# Patient Record
Sex: Male | Born: 1935 | Race: White | Hispanic: No | Marital: Married | Smoking: Former smoker
Health system: Southern US, Community
[De-identification: ages and names within clinical notes are randomized; demographics above are authoritative.]

## PROBLEM LIST (undated history)

## (undated) DIAGNOSIS — Z9189 Other specified personal risk factors, not elsewhere classified: Secondary | ICD-10-CM

## (undated) DIAGNOSIS — E785 Hyperlipidemia, unspecified: Secondary | ICD-10-CM

## (undated) DIAGNOSIS — R351 Nocturia: Secondary | ICD-10-CM

## (undated) DIAGNOSIS — N289 Disorder of kidney and ureter, unspecified: Secondary | ICD-10-CM

## (undated) DIAGNOSIS — I252 Old myocardial infarction: Secondary | ICD-10-CM

## (undated) DIAGNOSIS — E119 Type 2 diabetes mellitus without complications: Secondary | ICD-10-CM

## (undated) DIAGNOSIS — Z7901 Long term (current) use of anticoagulants: Secondary | ICD-10-CM

## (undated) DIAGNOSIS — Z856 Personal history of leukemia: Secondary | ICD-10-CM

## (undated) DIAGNOSIS — I440 Atrioventricular block, first degree: Secondary | ICD-10-CM

## (undated) DIAGNOSIS — G629 Polyneuropathy, unspecified: Secondary | ICD-10-CM

## (undated) DIAGNOSIS — D494 Neoplasm of unspecified behavior of bladder: Secondary | ICD-10-CM

## (undated) DIAGNOSIS — I10 Essential (primary) hypertension: Secondary | ICD-10-CM

## (undated) DIAGNOSIS — I2510 Atherosclerotic heart disease of native coronary artery without angina pectoris: Secondary | ICD-10-CM

## (undated) DIAGNOSIS — Z8546 Personal history of malignant neoplasm of prostate: Secondary | ICD-10-CM

## (undated) DIAGNOSIS — Z951 Presence of aortocoronary bypass graft: Secondary | ICD-10-CM

## (undated) DIAGNOSIS — I6523 Occlusion and stenosis of bilateral carotid arteries: Secondary | ICD-10-CM

## (undated) DIAGNOSIS — H269 Unspecified cataract: Secondary | ICD-10-CM

## (undated) DIAGNOSIS — Z8619 Personal history of other infectious and parasitic diseases: Secondary | ICD-10-CM

## (undated) DIAGNOSIS — Z973 Presence of spectacles and contact lenses: Secondary | ICD-10-CM

## (undated) DIAGNOSIS — H3530 Unspecified macular degeneration: Secondary | ICD-10-CM

## (undated) DIAGNOSIS — I480 Paroxysmal atrial fibrillation: Secondary | ICD-10-CM

## (undated) HISTORY — DX: Essential (primary) hypertension: I10

## (undated) HISTORY — DX: Unspecified macular degeneration: H35.30

## (undated) HISTORY — DX: Polyneuropathy, unspecified: G62.9

## (undated) HISTORY — PX: CARDIAC CATHETERIZATION: SHX172

## (undated) HISTORY — PX: TRANSTHORACIC ECHOCARDIOGRAM: SHX275

## (undated) HISTORY — DX: Hyperlipidemia, unspecified: E78.5

## (undated) HISTORY — DX: Atherosclerotic heart disease of native coronary artery without angina pectoris: I25.10

---

## 1997-10-11 HISTORY — PX: CORONARY ARTERY BYPASS GRAFT: SHX141

## 2006-10-11 HISTORY — PX: CARDIOVERSION: SHX1299

## 2009-08-22 LAB — PROTIME-INR

## 2009-09-19 ENCOUNTER — Encounter: Payer: Self-pay | Admitting: Cardiovascular Disease

## 2009-09-19 LAB — PROTIME-INR

## 2009-11-28 ENCOUNTER — Encounter: Payer: Self-pay | Admitting: Cardiovascular Disease

## 2010-03-13 ENCOUNTER — Encounter: Payer: Self-pay | Admitting: Cardiovascular Disease

## 2010-11-18 LAB — PROTIME-INR

## 2011-05-20 ENCOUNTER — Ambulatory Visit: Payer: Medicare Other | Admitting: *Deleted

## 2011-05-20 DIAGNOSIS — Z7901 Long term (current) use of anticoagulants: Secondary | ICD-10-CM

## 2011-05-20 DIAGNOSIS — I4891 Unspecified atrial fibrillation: Secondary | ICD-10-CM | POA: Insufficient documentation

## 2011-05-20 DIAGNOSIS — IMO0002 Reserved for concepts with insufficient information to code with codable children: Secondary | ICD-10-CM

## 2011-05-20 LAB — POCT INR: INR: 1.8

## 2011-05-25 ENCOUNTER — Ambulatory Visit (INDEPENDENT_AMBULATORY_CARE_PROVIDER_SITE_OTHER): Payer: Medicare Other | Admitting: *Deleted

## 2011-05-25 DIAGNOSIS — I4891 Unspecified atrial fibrillation: Secondary | ICD-10-CM

## 2011-05-25 DIAGNOSIS — Z7901 Long term (current) use of anticoagulants: Secondary | ICD-10-CM

## 2011-05-25 DIAGNOSIS — IMO0002 Reserved for concepts with insufficient information to code with codable children: Secondary | ICD-10-CM

## 2011-05-25 LAB — POCT INR: INR: 2.1

## 2011-06-23 ENCOUNTER — Ambulatory Visit (INDEPENDENT_AMBULATORY_CARE_PROVIDER_SITE_OTHER): Payer: Medicare Other | Admitting: *Deleted

## 2011-06-23 DIAGNOSIS — Z7901 Long term (current) use of anticoagulants: Secondary | ICD-10-CM

## 2011-06-23 DIAGNOSIS — IMO0002 Reserved for concepts with insufficient information to code with codable children: Secondary | ICD-10-CM

## 2011-06-23 DIAGNOSIS — I4891 Unspecified atrial fibrillation: Secondary | ICD-10-CM

## 2011-06-23 LAB — POCT INR: INR: 2.3

## 2011-07-16 ENCOUNTER — Ambulatory Visit (INDEPENDENT_AMBULATORY_CARE_PROVIDER_SITE_OTHER): Payer: Medicare Other | Admitting: *Deleted

## 2011-07-16 DIAGNOSIS — IMO0002 Reserved for concepts with insufficient information to code with codable children: Secondary | ICD-10-CM

## 2011-07-16 DIAGNOSIS — I4891 Unspecified atrial fibrillation: Secondary | ICD-10-CM

## 2011-07-16 DIAGNOSIS — Z7901 Long term (current) use of anticoagulants: Secondary | ICD-10-CM

## 2011-07-16 LAB — POCT INR: INR: 2

## 2011-08-05 ENCOUNTER — Telehealth: Payer: Self-pay | Admitting: Cardiovascular Disease

## 2011-08-05 NOTE — Telephone Encounter (Addendum)
ROI faxed to Dr.Thomas Lenns to obtain Records for Pt appt with Excell Seltzer on 08/12/11....Marland KitchenMarland KitchenFaxed 08/05/11/km  REcords received from Dr.Thomas Lenn's Office gave to [facility]  08/10/11/km

## 2011-08-11 ENCOUNTER — Encounter: Payer: Self-pay | Admitting: *Deleted

## 2011-08-12 ENCOUNTER — Ambulatory Visit (INDEPENDENT_AMBULATORY_CARE_PROVIDER_SITE_OTHER): Payer: Medicare Other | Admitting: Cardiovascular Disease

## 2011-08-12 ENCOUNTER — Encounter: Payer: Self-pay | Admitting: Cardiovascular Disease

## 2011-08-12 DIAGNOSIS — I4891 Unspecified atrial fibrillation: Secondary | ICD-10-CM

## 2011-08-12 DIAGNOSIS — I2510 Atherosclerotic heart disease of native coronary artery without angina pectoris: Secondary | ICD-10-CM

## 2011-08-12 DIAGNOSIS — I25810 Atherosclerosis of coronary artery bypass graft(s) without angina pectoris: Secondary | ICD-10-CM

## 2011-08-12 DIAGNOSIS — E119 Type 2 diabetes mellitus without complications: Secondary | ICD-10-CM

## 2011-08-12 DIAGNOSIS — IMO0002 Reserved for concepts with insufficient information to code with codable children: Secondary | ICD-10-CM

## 2011-08-12 DIAGNOSIS — E7800 Pure hypercholesterolemia, unspecified: Secondary | ICD-10-CM

## 2011-08-12 NOTE — Patient Instructions (Addendum)
You have been referred to Dr. Illene Regulus for a primary care doctor.  Your physician recommends that you schedule a follow-up appointment in: 3 months with Dr. Excell Seltzer.

## 2011-08-13 ENCOUNTER — Ambulatory Visit (INDEPENDENT_AMBULATORY_CARE_PROVIDER_SITE_OTHER): Payer: Medicare Other | Admitting: *Deleted

## 2011-08-13 DIAGNOSIS — I4891 Unspecified atrial fibrillation: Secondary | ICD-10-CM

## 2011-08-13 DIAGNOSIS — Z23 Encounter for immunization: Secondary | ICD-10-CM

## 2011-08-13 DIAGNOSIS — IMO0002 Reserved for concepts with insufficient information to code with codable children: Secondary | ICD-10-CM

## 2011-08-13 DIAGNOSIS — Z7901 Long term (current) use of anticoagulants: Secondary | ICD-10-CM

## 2011-08-21 ENCOUNTER — Encounter: Payer: Self-pay | Admitting: Cardiovascular Disease

## 2011-08-21 DIAGNOSIS — E119 Type 2 diabetes mellitus without complications: Secondary | ICD-10-CM | POA: Insufficient documentation

## 2011-08-21 DIAGNOSIS — I2510 Atherosclerotic heart disease of native coronary artery without angina pectoris: Secondary | ICD-10-CM | POA: Insufficient documentation

## 2011-08-21 NOTE — Assessment & Plan Note (Signed)
The patient is stable without angina. He appears to have good control of his modifiable risk factors. He could work on lifestyle modification with a focus on exercise and weight loss for better control of his diabetes, hyperlipidemia and cardiac risk reduction. No medication changes were made today. He has enough baseline EKG abnormality with LVH and repolarization changes I think exercise treadmill testing would not be particularly useful as a diagnostic tool. We'll consider an exercise Myoview scan will see him back in followup.  We discussed the idea of discontinuing low dose aspirin since he is on long-term anticoagulation with warfarin as to make her mental bleeding risk seems out weigh the benefits. He wanted to discuss this with his long-term cardiologist in [place], Dr. Jens Som.

## 2011-08-21 NOTE — Assessment & Plan Note (Addendum)
The patient appears stable. He is in sinus rhythm. LV function has been normal and he is doing well on long-term anticoagulation with warfarin.  We discussed the possibility of changing to Xarelto, and he will consider this in the future. I don't have a strong feeling about this as he has done well with Coumadin for many years.

## 2011-08-21 NOTE — Progress Notes (Signed)
HPI:  This is a 76 year old gentleman recently in the [place] area and more recently in [place] head, Ralph Gray. As his wife had a job change. The patient has a history of coronary artery disease with remote coronary bypass surgery. He also has paroxysmal atrial fibrillation. He has been anticoagulated with warfarin long term. He has done well from a symptomatic perspective. The patient exercises regularly without exertional symptoms. He plays tennis, walks, and maintains an active lifestyle. He specifically denies chest pain or pressure with exertion. He denies dyspnea, orthopnea, palpitations, PND, or edema.  I don't have records of the patient's bypass surgery, but he tells me he underwent four-vessel coronary bypass in 1999. He had no cardiac catheter study since that time as he has not had any recurrent problems with ischemia. His most recent stress test was in March 2000 and and this demonstrated normal perfusion with a small fixed apical perfusion abnormality consistent with "apical thinning." The gated left ventricular ejection fraction was 54%. There was no change when compared to the study from 2008. The patient had a carotid duplex scan in 2010 and this was reviewed as well. This showed moderate intimal thickening bilaterally with antegrade vertebral flow bilaterally. There was 40-59% stenosis bilaterally based on velocity measurements. Lab work was reviewed from September 28, 2010. This showed a normal creatinine of 1.2 mg per deciliter, lipids showed a cholesterol of 121, HDL 35, LDL 56, and triglycerides 409. Hemoglobin A1c was 7.3. An echocardiogram dated December 28, 1998 and showed mild concentric left ventricular hypertrophy with normal LV systolic function. There was mild pulmonic and tricuspid regurgitation.  Outpatient Encounter Prescriptions as of 08/12/2011  Medication Sig Dispense Refill  . aspirin 81 MG tablet Take 81 mg by mouth daily.        Marland Kitchen atorvastatin (LIPITOR) 10 MG tablet  Take 10 mg by mouth daily.        . felodipine (PLENDIL) 5 MG 24 hr tablet Take 5 mg by mouth daily.        . fluticasone (VERAMYST) 27.5 MCG/SPRAY nasal spray Place 2 sprays into the nose daily.        Marland Kitchen glimepiride (AMARYL) 4 MG tablet Take 4 mg by mouth daily before breakfast.        . L-Methylfolate-B6-B12 (METANX PO) Take 1 tablet by mouth daily.        Marland Kitchen losartan (COZAAR) 50 MG tablet Take 50 mg by mouth 2 (two) times daily.        . metformin (FORTAMET) 1000 MG (OSM) 24 hr tablet Take 1,000 mg by mouth 2 (two) times daily with a meal.        . metoprolol succinate (TOPROL-XL) 25 MG 24 hr tablet 25 mg. Take 1/2 tablet daily      . montelukast (SINGULAIR) 10 MG tablet Take 10 mg by mouth at bedtime.        . pregabalin (LYRICA) 75 MG capsule Take 75 mg by mouth 2 (two) times daily.        . Tamsulosin HCl (FLOMAX) 0.4 MG CAPS Take 0.4 mg by mouth daily.        Marland Kitchen warfarin (COUMADIN) 6 MG tablet Take 6 mg by mouth as directed.       Marland Kitchen DISCONTD: aspirin 325 MG tablet Take 325 mg by mouth daily.        Marland Kitchen DISCONTD: felodipine (PLENDIL) 2.5 MG 24 hr tablet Take 2.5 mg by mouth 2 (two) times daily.        Marland Kitchen  DISCONTD: rOPINIRole (REQUIP) 0.5 MG tablet Take 0.5 mg by mouth at bedtime.          Review of patient's allergies indicates no known allergies.  Past Medical History  Diagnosis Date  . Diabetes mellitus   . Hypertension   . Hyperlipidemia   . Coronary artery disease   . Atrial fibrillation   . Prostate cancer   . Peripheral neuropathy   . CLL (chronic lymphoblastic leukemia)   . Macular degeneration     Past Surgical History  Procedure Date  . Coronary artery bypass graft   . Cardioversion     History   Social History  . Marital Status: Married    Spouse Name: N/A    Number of Children: N/A  . Years of Education: N/A   Occupational History  . Not on file.   Social History Main Topics  . Smoking status: Former Games developer  . Smokeless tobacco: Not on file  . Alcohol  Use: No  . Drug Use: No  . Sexually Active: Not on file   Other Topics Concern  . Not on file   Social History Narrative  . No narrative on file    Family History  Problem Relation Age of Onset  . Other Mother 62    died  . Heart attack Father 40    died    ROS: General: no fevers/chills/night sweats Eyes: no blurry vision, diplopia, or amaurosis ENT: no sore throat or hearing loss Resp: no cough, wheezing, or hemoptysis CV: no edema or palpitations GI: no abdominal pain, nausea, vomiting, diarrhea, or constipation GU: no dysuria or hematuria, positive for urinary frequency and hesitancy Skin: no rash Neuro: no headache, numbness, tingling, or weakness of extremities Musculoskeletal: no joint pain or swelling Heme: no bleeding, DVT, or easy bruising Endo: no polydipsia or polyuria  BP 138/62  Pulse 57  Resp 18  Ht 6' (1.829 m)  Wt 99.791 kg (220 lb)  BMI 29.84 kg/m2  PHYSICAL EXAM: Pt is alert and oriented, overweight male, WD, WN, in no distress. HEENT: normal Neck: JVP normal. Carotid upstrokes normal without bruits. No thyromegaly. Lungs: equal expansion, clear bilaterally CV: Apex is discrete and nondisplaced, RRR without murmur or gallop Abd: soft, NT, +BS, no bruit, no hepatosplenomegaly Back: no CVA tenderness Ext: no C/C/E        Femoral pulses 2+= without bruits        DP/PT pulses intact and = Skin: warm and dry without rash Neuro: CNII-XII intact             Strength intact = bilaterally  EKG:  Sinus bradycardia 56 beats per minute, first degree AV block, LVH with repolarization abnormality.  ASSESSMENT AND PLAN:

## 2011-08-21 NOTE — Assessment & Plan Note (Signed)
The patient needs a primary care physician. I'm going to refer him to Dr. Debby Bud.

## 2011-09-09 ENCOUNTER — Ambulatory Visit (INDEPENDENT_AMBULATORY_CARE_PROVIDER_SITE_OTHER): Payer: Medicare Other | Admitting: *Deleted

## 2011-09-09 DIAGNOSIS — I4891 Unspecified atrial fibrillation: Secondary | ICD-10-CM

## 2011-09-09 DIAGNOSIS — Z7901 Long term (current) use of anticoagulants: Secondary | ICD-10-CM

## 2011-09-09 DIAGNOSIS — IMO0002 Reserved for concepts with insufficient information to code with codable children: Secondary | ICD-10-CM

## 2011-09-30 ENCOUNTER — Encounter: Payer: Self-pay | Admitting: Cardiovascular Disease

## 2011-10-14 ENCOUNTER — Ambulatory Visit (INDEPENDENT_AMBULATORY_CARE_PROVIDER_SITE_OTHER): Payer: Medicare Other | Admitting: Internal Medicine

## 2011-10-14 ENCOUNTER — Encounter: Payer: Self-pay | Admitting: Internal Medicine

## 2011-10-14 ENCOUNTER — Telehealth: Payer: Self-pay | Admitting: Internal Medicine

## 2011-10-14 ENCOUNTER — Other Ambulatory Visit (INDEPENDENT_AMBULATORY_CARE_PROVIDER_SITE_OTHER): Payer: Medicare Other

## 2011-10-14 DIAGNOSIS — E119 Type 2 diabetes mellitus without complications: Secondary | ICD-10-CM | POA: Diagnosis not present

## 2011-10-14 DIAGNOSIS — I4891 Unspecified atrial fibrillation: Secondary | ICD-10-CM

## 2011-10-14 DIAGNOSIS — I2510 Atherosclerotic heart disease of native coronary artery without angina pectoris: Secondary | ICD-10-CM

## 2011-10-14 DIAGNOSIS — C9110 Chronic lymphocytic leukemia of B-cell type not having achieved remission: Secondary | ICD-10-CM

## 2011-10-14 DIAGNOSIS — IMO0002 Reserved for concepts with insufficient information to code with codable children: Secondary | ICD-10-CM

## 2011-10-14 DIAGNOSIS — G629 Polyneuropathy, unspecified: Secondary | ICD-10-CM

## 2011-10-14 DIAGNOSIS — I10 Essential (primary) hypertension: Secondary | ICD-10-CM

## 2011-10-14 DIAGNOSIS — G609 Hereditary and idiopathic neuropathy, unspecified: Secondary | ICD-10-CM

## 2011-10-14 DIAGNOSIS — H3530 Unspecified macular degeneration: Secondary | ICD-10-CM

## 2011-10-14 DIAGNOSIS — E785 Hyperlipidemia, unspecified: Secondary | ICD-10-CM

## 2011-10-14 LAB — COMPREHENSIVE METABOLIC PANEL
ALT: 33 U/L (ref 0–53)
AST: 31 U/L (ref 0–37)
BUN: 24 mg/dL — ABNORMAL HIGH (ref 6–23)
Calcium: 9.8 mg/dL (ref 8.4–10.5)
Chloride: 109 mEq/L (ref 96–112)
Creatinine, Ser: 1.2 mg/dL (ref 0.4–1.5)
GFR: 62.84 mL/min (ref >60.00)
Total Bilirubin: 0.7 mg/dL (ref 0.3–1.2)

## 2011-10-14 LAB — CBC WITH DIFFERENTIAL/PLATELET
Basophils Absolute: 0.1 10*3/uL (ref 0.0–0.1)
Basophils Relative: 0.4 % (ref 0.0–3.0)
Eosinophils Absolute: 0.1 10*3/uL (ref 0.0–0.7)
Eosinophils Relative: 0.8 % (ref 0.0–5.0)
HCT: 41.2 % (ref 39.0–52.0)
Hemoglobin: 13.9 g/dL (ref 13.0–17.0)
Lymphocytes Relative: 32.3 % (ref 12.0–46.0)
Lymphs Abs: 4.6 10*3/uL — ABNORMAL HIGH (ref 0.7–4.0)
MCHC: 33.8 g/dL (ref 30.0–36.0)
MCV: 92.1 fl (ref 78.0–100.0)
Monocytes Absolute: 1.1 10*3/uL — ABNORMAL HIGH (ref 0.1–1.0)
Monocytes Relative: 7.4 % (ref 3.0–12.0)
Neutro Abs: 8.5 10*3/uL — ABNORMAL HIGH (ref 1.4–7.7)
Neutrophils Relative %: 59.1 % (ref 43.0–77.0)
Platelets: 176 10*3/uL (ref 150.0–400.0)
RBC: 4.47 Mil/uL (ref 4.22–5.81)
RDW: 13.8 % (ref 11.5–14.6)
WBC: 14.3 10*3/uL — ABNORMAL HIGH (ref 4.5–10.5)

## 2011-10-14 LAB — LIPID PANEL
Cholesterol: 142 mg/dL (ref 0–200)
LDL Cholesterol: 60 mg/dL (ref 0–99)
Triglycerides: 156 mg/dL — ABNORMAL HIGH (ref 0.0–149.0)

## 2011-10-14 LAB — PROTIME-INR
INR: 2.7 ratio — ABNORMAL HIGH (ref 0.8–1.0)
Prothrombin Time: 29.7 s — ABNORMAL HIGH (ref 10.2–12.4)

## 2011-10-14 LAB — HEMOGLOBIN A1C: Hgb A1c MFr Bld: 7 % — ABNORMAL HIGH (ref 4.6–6.5)

## 2011-10-14 NOTE — Progress Notes (Signed)
Subjective:    Patient ID: Ralph Gray, male    DOB: [birth year], 77 y.o.   MRN: [id number]  HPI Ralph Gray presents to establish for on-going care having moved from [facility]. He has multiple medical problems and is in need of follow-up lab today:A1C, INR, Bmet, CBCD, He is generally feeling well. He has established with Dr. Excell Seltzer for cardiology.   Past Medical History  Diagnosis Date  . Diabetes mellitus   . Hypertension   . Hyperlipidemia   . Coronary artery disease     MI '99  . [place]-induced atrial fibrillation     started 2006  . Peripheral neuropathy   . Macular degeneration     has had intra-orbital injections. Has had laser treatment  . Prostate cancer     XRT 47 tx  . CLL (chronic lymphoblastic leukemia)    Past Surgical History  Procedure Date  . Cardioversion   . Coronary artery bypass graft '99    4 vessel: [place] [facility].    Family History  Problem Relation Age of Onset  . Other Mother 64    died  . Cancer Mother     sarcoma  . Heart attack Father 40    died  . Heart disease Father     MI  . Diabetes Neg Hx   . COPD Neg Hx    History   Social History  . Marital Status: Married    Spouse Name: N/A    Number of Children: N/A  . Years of Education: N/A   Occupational History  . Not on file.   Social History Main Topics  . Smoking status: Former Smoker    Quit date: 10/14/1963  . Smokeless tobacco: Never Used  . Alcohol Use: Yes     wine rarely  . Drug Use: No  . Sexually Active: Yes -- Male partner(s)   Other Topics Concern  . Not on file   Social History Narrative   [employer], [name], EDD - doctorate in education. Married '64 - 30yr/divorced ( severe bipolar disease); Married '07. 2 sons - '67, '66; 1 dtr - '70. 7 grandchildren. Exercise - 6 days a week: cardio and strengthening. 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 ACP-  yes for CPR; no -long term ventilation; no heroic or futile measures.        Review of Systems Constitutional:  Negative for fever, chills, activity change and unexpected weight change.  HEENT:  Negative for hearing loss, ear pain, congestion, neck stiffness and postnasal drip. Negative for sore throat or swallowing problems. Negative for dental complaints.   Eyes: Negative for vision loss or change in visual acuity.  Respiratory: Negative for chest tightness and wheezing. Negative for DOE.   Cardiovascular: Negative for chest pain or palpitations. No decreased exercise tolerance Gastrointestinal: No change in bowel habit. No bloating or gas. No reflux or indigestion Genitourinary: Negative for urgency, frequency, flank pain and difficulty urinating.  Musculoskeletal: Negative for myalgias, back pain, arthralgias and gait problem.  Neurological: Negative for dizziness, tremors, weakness and headaches.  Hematological: Negative for adenopathy.  Psychiatric/Behavioral: Negative for behavioral problems and dysphoric mood.       Objective:   Physical Exam Filed Vitals:   10/14/11 1015  BP: 102/62  Pulse: 67  Temp: 97.4 F (36.3 C)  Resp: 16   Gen'l- WNWD white man in nodi dsitress HEENT - C&S clear Neck- supple,  no thyromegaly Pulm - normal respirations, no rales or wheezing Cor - 2+ radial pulse, no JVD, RRR Neuro - A&O x 3, CN II-XII grossly normal, normal strength, balance and gait  Lab Results  Component Value Date   WBC 14.3* 10/14/2011   HGB 13.9 10/14/2011   HCT 41.2 10/14/2011   PLT 176.0 10/14/2011   GLUCOSE 116* 10/14/2011   CHOL 142 10/14/2011   TRIG 156.0* 10/14/2011   HDL 51.10 10/14/2011   LDLCALC 60 10/14/2011   ALT 33 10/14/2011   AST 31 10/14/2011   NA 142 10/14/2011   K 4.8 10/14/2011   CL 109 10/14/2011   CREATININE 1.2 10/14/2011   BUN 24* 10/14/2011   CO2 27 10/14/2011   INR 2.7* 10/14/2011   HGBA1C 7.0* 10/14/2011          Assessment & Plan:  In summary - a very pleasant  fellow who has multiple medical problems that seem well controlled at this time. He will continue monthly protimes, either at [facility] coag clinic or at the [facility] coag satellite once it is up and running. He will return for routine followup of CLL, DM and lipids in 6 months -orders entered. He is oriented to the practice logistics and services. He will return as needed or after his 6 months lab.

## 2011-10-14 NOTE — Telephone Encounter (Signed)
Received copies from [facility], on 10/14/11. Forwarded 34pages to Dr. Reva Bores review.

## 2011-10-18 DIAGNOSIS — G629 Polyneuropathy, unspecified: Secondary | ICD-10-CM | POA: Insufficient documentation

## 2011-10-18 DIAGNOSIS — E785 Hyperlipidemia, unspecified: Secondary | ICD-10-CM | POA: Insufficient documentation

## 2011-10-18 DIAGNOSIS — H3530 Unspecified macular degeneration: Secondary | ICD-10-CM | POA: Insufficient documentation

## 2011-10-18 DIAGNOSIS — I10 Essential (primary) hypertension: Secondary | ICD-10-CM | POA: Insufficient documentation

## 2011-10-18 NOTE — Assessment & Plan Note (Signed)
[place]. He has been stable: no chest pain, no limitation in activity, no decreased exercise tolerance  Plan- continued risk reduction          Follow-up with Dr. Excell Seltzer as needed and with his former cardiologist as he has scheduled.

## 2011-10-18 NOTE — Assessment & Plan Note (Signed)
BP Readings from Last 3 Encounters:  10/14/11 102/62  08/12/11 138/62   Good control on last two visits. He does monitor his BP at home and has had consistently good control on his present medical regimen.  Plan - no change in medications.

## 2011-10-18 NOTE — Assessment & Plan Note (Signed)
Lipid panel reveals LDL levels that are better than goal of 80 or less.  Plan - continue present medical and life-style therapy.

## 2011-10-18 NOTE — Assessment & Plan Note (Signed)
Stable - no increased infection rate, no bleeding or bruising. WBC mildly elevated.  Plan- periodic CBC, recommend next in 6 months.

## 2011-10-18 NOTE — Assessment & Plan Note (Signed)
NIDDM on sulfonylurea and biguanides therapy. A1C reflects adequate control @ 7%  Plan - continue life-style management with good diet and exercise           Continue present medical therapy with an eye to moving from sulfonylurea in the future.

## 2011-10-18 NOTE — Assessment & Plan Note (Signed)
Appears to be in sinus rhythm at today's exam.  Plan - continue anticoagulation therapy. INR @ 2.7 is therapeutic.

## 2011-10-18 NOTE — Assessment & Plan Note (Signed)
He will consider establishing with a local opthalmologist. At this time he report he completed a course of injection therapy, also that his vision is stable.

## 2011-10-18 NOTE — Assessment & Plan Note (Signed)
Diabetic related peripheral neuropathy which is controlled with lyrica. He is not limited in his activities nor is his sleep disturbed.

## 2011-10-27 ENCOUNTER — Telehealth: Payer: Self-pay | Admitting: *Deleted

## 2011-10-27 NOTE — Telephone Encounter (Signed)
[facility], Dr. Mills Koller al

## 2011-10-27 NOTE — Telephone Encounter (Signed)
Pt. Would like for you to recommend a podiatrist for him. Please advise.

## 2011-10-28 DIAGNOSIS — S93409A Sprain of unspecified ligament of unspecified ankle, initial encounter: Secondary | ICD-10-CM | POA: Diagnosis not present

## 2011-10-28 DIAGNOSIS — M898X9 Other specified disorders of bone, unspecified site: Secondary | ICD-10-CM | POA: Diagnosis not present

## 2011-10-28 DIAGNOSIS — B351 Tinea unguium: Secondary | ICD-10-CM | POA: Diagnosis not present

## 2011-10-28 NOTE — Telephone Encounter (Signed)
Notified pt. 

## 2011-11-16 ENCOUNTER — Encounter: Payer: Self-pay | Admitting: Cardiovascular Disease

## 2011-11-16 ENCOUNTER — Ambulatory Visit (INDEPENDENT_AMBULATORY_CARE_PROVIDER_SITE_OTHER): Payer: Medicare Other | Admitting: Cardiovascular Disease

## 2011-11-16 ENCOUNTER — Ambulatory Visit (INDEPENDENT_AMBULATORY_CARE_PROVIDER_SITE_OTHER): Payer: Medicare Other

## 2011-11-16 DIAGNOSIS — R0989 Other specified symptoms and signs involving the circulatory and respiratory systems: Secondary | ICD-10-CM

## 2011-11-16 DIAGNOSIS — E7800 Pure hypercholesterolemia, unspecified: Secondary | ICD-10-CM | POA: Diagnosis not present

## 2011-11-16 DIAGNOSIS — I10 Essential (primary) hypertension: Secondary | ICD-10-CM | POA: Diagnosis not present

## 2011-11-16 DIAGNOSIS — IMO0002 Reserved for concepts with insufficient information to code with codable children: Secondary | ICD-10-CM

## 2011-11-16 DIAGNOSIS — I4891 Unspecified atrial fibrillation: Secondary | ICD-10-CM

## 2011-11-16 DIAGNOSIS — I2510 Atherosclerotic heart disease of native coronary artery without angina pectoris: Secondary | ICD-10-CM | POA: Diagnosis not present

## 2011-11-16 DIAGNOSIS — I6529 Occlusion and stenosis of unspecified carotid artery: Secondary | ICD-10-CM | POA: Diagnosis not present

## 2011-11-16 DIAGNOSIS — Z7901 Long term (current) use of anticoagulants: Secondary | ICD-10-CM

## 2011-11-16 DIAGNOSIS — E785 Hyperlipidemia, unspecified: Secondary | ICD-10-CM

## 2011-11-16 NOTE — Assessment & Plan Note (Signed)
The patient is stable on warfarin. He remains in sinus rhythm. He may consider switching to a newer agent down the road but for now would like to continue on his current therapy. We will establish him with our Coumadin clinic.

## 2011-11-16 NOTE — Assessment & Plan Note (Signed)
Blood pressure well controlled on appropriate medical therapy.

## 2011-11-16 NOTE — Assessment & Plan Note (Signed)
The patient is stable without anginal symptoms. However, he is approaching 15 years out from coronary bypass surgery. He has diabetes and is at risk for silent ischemia. I have recommended an exercise Myoview stress test before his next office visit here. He will continue on his current medical program. We discussed the importance of weight reduction.

## 2011-11-16 NOTE — Progress Notes (Signed)
HPI:  [name] presents for followup. He is a 77 year old gentleman with coronary artery disease status post CABG in 1999, paroxysmal atrial fibrillation, hypertension, diabetes, and hyperlipidemia.  He is on long-term warfarin and needs to establish with our Coumadin clinic.  The patient has been exercising regularly without exertional symptoms. He specifically denies chest pain or pressure. He denies dyspnea, edema, palpitations, lightheadedness, orthopnea, PND, or syncope.  He has had no bleeding problems.  Outpatient Encounter Prescriptions as of 11/16/2011  Medication Sig Dispense Refill  . aspirin 81 MG tablet Take 81 mg by mouth daily.        Marland Kitchen atorvastatin (LIPITOR) 10 MG tablet Take 10 mg by mouth daily.        . felodipine (PLENDIL) 5 MG 24 hr tablet Take 5 mg by mouth daily.        . fluticasone (VERAMYST) 27.5 MCG/SPRAY nasal spray Place 2 sprays into the nose daily.        Marland Kitchen glimepiride (AMARYL) 4 MG tablet Take 4 mg by mouth daily before breakfast.        . L-Methylfolate-B6-B12 (METANX PO) Take 1 tablet by mouth daily.        Marland Kitchen losartan (COZAAR) 50 MG tablet Take 50 mg by mouth 2 (two) times daily.        . metformin (FORTAMET) 1000 MG (OSM) 24 hr tablet Take 1,000 mg by mouth 2 (two) times daily with a meal.        . metoprolol succinate (TOPROL-XL) 25 MG 24 hr tablet 25 mg. Take 1/2 tablet daily      . montelukast (SINGULAIR) 10 MG tablet Take 10 mg by mouth at bedtime.        . pregabalin (LYRICA) 75 MG capsule Take 75 mg by mouth 2 (two) times daily.        . Tamsulosin HCl (FLOMAX) 0.4 MG CAPS Take 0.4 mg by mouth daily.        Marland Kitchen warfarin (COUMADIN) 6 MG tablet Take 6 mg by mouth as directed.         No Known Allergies  Past Medical History  Diagnosis Date  . Diabetes mellitus   . Hypertension   . Hyperlipidemia   . Coronary artery disease     MI '99  . Atrial fibrillation     started 2006  . Peripheral neuropathy   . Macular degeneration     has had intra-orbital  injections. Has had laser treatment  . Prostate cancer     XRT 45 tx  . CLL (chronic lymphoblastic leukemia)     ROS: Negative except as per HPI  BP 135/68  Pulse 58  Ht 5' 10.5" (1.791 m)  Wt 99.519 kg (219 lb 6.4 oz)  BMI 31.04 kg/m2  PHYSICAL EXAM: Pt is alert and oriented, NAD HEENT: normal Neck: JVP - normal, carotids 2+= with soft bilateral carotid bruits Lungs: CTA bilaterally CV: RRR without murmur or gallop Abd: soft, NT, Positive BS, obese Ext: no C/C/E, distal pulses intact and equal Skin: warm/dry no rash  ASSESSMENT AND PLAN:

## 2011-11-16 NOTE — Assessment & Plan Note (Signed)
The patient has asymptomatic carotid stenosis. He is due for surveillance ultrasound. His previous studies were done in [place]. He has had 40-60% range carotid stenosis bilaterally.

## 2011-11-16 NOTE — Patient Instructions (Signed)
Your physician has requested that you have a carotid duplex. This test is an ultrasound of the carotid arteries in your neck. It looks at blood flow through these arteries that supply the brain with blood. Allow one hour for this exam. There are no restrictions or special instructions.  Your physician wants you to follow-up in: 6 MONTHS.  You will receive a reminder letter in the mail two months in advance. If you don't receive a letter, please call our office to schedule the follow-up appointment.  Your physician has requested that you have an exercise stress myoview in 6 MONTHS. For further information please visit [email or web address] Please follow instruction sheet, as given.  Your physician recommends that you continue on your current medications as directed. Please refer to the Current Medication list given to you today.

## 2011-11-16 NOTE — Assessment & Plan Note (Signed)
Lipids are excellent with an HDL of 51 and an LDL of 60

## 2011-12-10 ENCOUNTER — Encounter (INDEPENDENT_AMBULATORY_CARE_PROVIDER_SITE_OTHER): Payer: Medicare Other

## 2011-12-10 DIAGNOSIS — E7800 Pure hypercholesterolemia, unspecified: Secondary | ICD-10-CM

## 2011-12-10 DIAGNOSIS — I2510 Atherosclerotic heart disease of native coronary artery without angina pectoris: Secondary | ICD-10-CM

## 2011-12-10 DIAGNOSIS — I6529 Occlusion and stenosis of unspecified carotid artery: Secondary | ICD-10-CM | POA: Diagnosis not present

## 2011-12-14 ENCOUNTER — Ambulatory Visit (INDEPENDENT_AMBULATORY_CARE_PROVIDER_SITE_OTHER): Payer: Medicare Other | Admitting: *Deleted

## 2011-12-14 DIAGNOSIS — I4891 Unspecified atrial fibrillation: Secondary | ICD-10-CM

## 2011-12-14 DIAGNOSIS — Z7901 Long term (current) use of anticoagulants: Secondary | ICD-10-CM

## 2011-12-14 DIAGNOSIS — IMO0002 Reserved for concepts with insufficient information to code with codable children: Secondary | ICD-10-CM

## 2011-12-14 LAB — POCT INR: INR: 2.7

## 2011-12-16 ENCOUNTER — Telehealth: Payer: Self-pay | Admitting: *Deleted

## 2011-12-16 DIAGNOSIS — I6529 Occlusion and stenosis of unspecified carotid artery: Secondary | ICD-10-CM

## 2011-12-16 NOTE — Telephone Encounter (Signed)
Pt is aware of carotid results and recommendation to repeat in 6 months He has asked about the myoview. It was also in 6 months. Aware they would not be scheduled on the same day. Mylo Red RN

## 2011-12-16 NOTE — Telephone Encounter (Signed)
Message copied by Barrie Folk on Thu Dec 16, 2011  4:35 PM ------      Message from: Tonny Bollman      Created: Thu Dec 16, 2011  6:07 AM       Moderate carotid disease f/u 6 months

## 2011-12-21 DIAGNOSIS — H353290 Exudative age-related macular degeneration, unspecified eye, stage unspecified: Secondary | ICD-10-CM | POA: Diagnosis not present

## 2011-12-21 DIAGNOSIS — H35079 Retinal telangiectasis, unspecified eye: Secondary | ICD-10-CM | POA: Diagnosis not present

## 2011-12-21 DIAGNOSIS — H2510 Age-related nuclear cataract, unspecified eye: Secondary | ICD-10-CM | POA: Diagnosis not present

## 2011-12-21 DIAGNOSIS — H547 Unspecified visual loss: Secondary | ICD-10-CM | POA: Diagnosis not present

## 2011-12-27 DIAGNOSIS — M79609 Pain in unspecified limb: Secondary | ICD-10-CM | POA: Diagnosis not present

## 2011-12-27 DIAGNOSIS — B351 Tinea unguium: Secondary | ICD-10-CM | POA: Diagnosis not present

## 2012-01-04 ENCOUNTER — Other Ambulatory Visit: Payer: Self-pay | Admitting: Internal Medicine

## 2012-01-04 NOTE — Telephone Encounter (Signed)
The pt called and is hoping to get all of these rx refilled :   Glimepridie 4mg , (generic) Lyrica 75 mg, warfarin 6mg , montelukast 10mg , atorvastatin 10mg , losartan 50mg   The patient stated he was in for an ov about a month and a half ago.    Thanks!

## 2012-01-05 MED ORDER — MONTELUKAST SODIUM 10 MG PO TABS: 10.0000 mg | ORAL_TABLET | Freq: Every day | ORAL | Status: DC

## 2012-01-05 MED ORDER — ATORVASTATIN CALCIUM 10 MG PO TABS: 10.0000 mg | ORAL_TABLET | Freq: Every day | ORAL | Status: DC

## 2012-01-05 MED ORDER — WARFARIN SODIUM 6 MG PO TABS: 6.0000 mg | ORAL_TABLET | ORAL | Status: DC

## 2012-01-05 MED ORDER — GLIMEPIRIDE 4 MG PO TABS: 4.0000 mg | ORAL_TABLET | Freq: Every day | ORAL | Status: DC

## 2012-01-05 MED ORDER — LOSARTAN POTASSIUM 50 MG PO TABS: 50.0000 mg | ORAL_TABLET | Freq: Two times a day (BID) | ORAL | Status: DC

## 2012-01-05 NOTE — Telephone Encounter (Signed)
Lyrica request [NP established 01.03.13]

## 2012-01-06 MED ORDER — PREGABALIN 75 MG PO CAPS: 75.0000 mg | ORAL_CAPSULE | Freq: Two times a day (BID) | ORAL | Status: DC

## 2012-01-10 ENCOUNTER — Telehealth: Payer: Self-pay | Admitting: Internal Medicine

## 2012-01-10 NOTE — Telephone Encounter (Signed)
okey dokey 

## 2012-01-10 NOTE — Telephone Encounter (Signed)
Pt requesting metanax 90 day supply [facility]:  [phone]---

## 2012-01-11 ENCOUNTER — Ambulatory Visit (INDEPENDENT_AMBULATORY_CARE_PROVIDER_SITE_OTHER): Payer: Medicare Other

## 2012-01-11 DIAGNOSIS — I4891 Unspecified atrial fibrillation: Secondary | ICD-10-CM | POA: Diagnosis not present

## 2012-01-11 DIAGNOSIS — IMO0002 Reserved for concepts with insufficient information to code with codable children: Secondary | ICD-10-CM

## 2012-01-11 DIAGNOSIS — Z7901 Long term (current) use of anticoagulants: Secondary | ICD-10-CM

## 2012-01-11 MED ORDER — METANX 3-35-2 MG PO TABS: 1.0000 | ORAL_TABLET | Freq: Every day | ORAL | Status: DC

## 2012-01-11 NOTE — Telephone Encounter (Signed)
Done

## 2012-01-28 ENCOUNTER — Telehealth: Payer: Self-pay | Admitting: Internal Medicine

## 2012-01-28 NOTE — Telephone Encounter (Signed)
Please see note 03/27 from Silas Flood, CMA. Has pt received Rx?

## 2012-01-28 NOTE — Telephone Encounter (Signed)
The pt called and is hoping to get a refill of Lyrica.  He is hoping to get this filled through [facility].   Thank you !

## 2012-01-31 MED ORDER — PREGABALIN 75 MG PO CAPS: 75.0000 mg | ORAL_CAPSULE | Freq: Two times a day (BID) | ORAL | Status: DC

## 2012-01-31 NOTE — Telephone Encounter (Signed)
Printed by MEN; no documentation that Rx was Faxed or given to patient.? Rx printed for faxing to [facility]; awaiting MD signature 04.22.13/SLS

## 2012-01-31 NOTE — Telephone Encounter (Signed)
Faxed to [facility]; patient informed, does not need samples at this time.

## 2012-02-02 ENCOUNTER — Telehealth: Payer: Self-pay | Admitting: *Deleted

## 2012-02-02 NOTE — Telephone Encounter (Signed)
Spoke directly with pharmacist Shirl Harris Wood] at [facility]; informed that Rx request for Lyrica made twice and pt still does not have medication--patient's Rx will be expedited at no cost to patient. Patient informed.

## 2012-02-21 ENCOUNTER — Ambulatory Visit (INDEPENDENT_AMBULATORY_CARE_PROVIDER_SITE_OTHER): Payer: Medicare Other | Admitting: Pharmacist

## 2012-02-21 DIAGNOSIS — I4891 Unspecified atrial fibrillation: Secondary | ICD-10-CM

## 2012-02-21 DIAGNOSIS — Z7901 Long term (current) use of anticoagulants: Secondary | ICD-10-CM

## 2012-02-21 DIAGNOSIS — IMO0002 Reserved for concepts with insufficient information to code with codable children: Secondary | ICD-10-CM

## 2012-02-21 LAB — POCT INR: INR: 2.1

## 2012-04-03 ENCOUNTER — Ambulatory Visit (INDEPENDENT_AMBULATORY_CARE_PROVIDER_SITE_OTHER): Payer: Medicare Other | Admitting: Pharmacist

## 2012-04-03 DIAGNOSIS — I4891 Unspecified atrial fibrillation: Secondary | ICD-10-CM | POA: Diagnosis not present

## 2012-04-03 DIAGNOSIS — M79609 Pain in unspecified limb: Secondary | ICD-10-CM | POA: Diagnosis not present

## 2012-04-03 DIAGNOSIS — Z7901 Long term (current) use of anticoagulants: Secondary | ICD-10-CM | POA: Diagnosis not present

## 2012-04-03 DIAGNOSIS — B351 Tinea unguium: Secondary | ICD-10-CM | POA: Diagnosis not present

## 2012-04-03 DIAGNOSIS — IMO0002 Reserved for concepts with insufficient information to code with codable children: Secondary | ICD-10-CM

## 2012-04-03 LAB — POCT INR: INR: 2.3

## 2012-04-10 DIAGNOSIS — I2510 Atherosclerotic heart disease of native coronary artery without angina pectoris: Secondary | ICD-10-CM | POA: Diagnosis not present

## 2012-04-25 ENCOUNTER — Other Ambulatory Visit: Payer: Self-pay

## 2012-04-25 MED ORDER — MONTELUKAST SODIUM 10 MG PO TABS: 10.0000 mg | ORAL_TABLET | Freq: Every day | ORAL | Status: DC

## 2012-04-25 MED ORDER — TAMSULOSIN HCL 0.4 MG PO CAPS: 0.4000 mg | ORAL_CAPSULE | Freq: Every day | ORAL | Status: DC

## 2012-04-25 MED ORDER — METOPROLOL SUCCINATE ER 25 MG PO TB24: 12.5000 mg | ORAL_TABLET | Freq: Every day | ORAL | Status: DC

## 2012-04-25 MED ORDER — FELODIPINE ER 5 MG PO TB24: 5.0000 mg | ORAL_TABLET | Freq: Every day | ORAL | Status: DC

## 2012-04-25 MED ORDER — WARFARIN SODIUM 6 MG PO TABS: 6.0000 mg | ORAL_TABLET | ORAL | Status: DC

## 2012-04-25 MED ORDER — METFORMIN HCL ER (OSM) 1000 MG PO TB24: 1000.0000 mg | ORAL_TABLET | Freq: Two times a day (BID) | ORAL | Status: DC

## 2012-05-02 DIAGNOSIS — H3581 Retinal edema: Secondary | ICD-10-CM | POA: Diagnosis not present

## 2012-05-02 DIAGNOSIS — H43829 Vitreomacular adhesion, unspecified eye: Secondary | ICD-10-CM | POA: Diagnosis not present

## 2012-05-02 DIAGNOSIS — H353290 Exudative age-related macular degeneration, unspecified eye, stage unspecified: Secondary | ICD-10-CM | POA: Diagnosis not present

## 2012-05-02 DIAGNOSIS — H35079 Retinal telangiectasis, unspecified eye: Secondary | ICD-10-CM | POA: Diagnosis not present

## 2012-05-02 DIAGNOSIS — H547 Unspecified visual loss: Secondary | ICD-10-CM | POA: Diagnosis not present

## 2012-05-12 DIAGNOSIS — H43819 Vitreous degeneration, unspecified eye: Secondary | ICD-10-CM | POA: Diagnosis not present

## 2012-05-12 DIAGNOSIS — H43829 Vitreomacular adhesion, unspecified eye: Secondary | ICD-10-CM | POA: Diagnosis not present

## 2012-05-12 DIAGNOSIS — H35349 Macular cyst, hole, or pseudohole, unspecified eye: Secondary | ICD-10-CM | POA: Diagnosis not present

## 2012-05-12 DIAGNOSIS — H353290 Exudative age-related macular degeneration, unspecified eye, stage unspecified: Secondary | ICD-10-CM | POA: Diagnosis not present

## 2012-05-15 ENCOUNTER — Ambulatory Visit (INDEPENDENT_AMBULATORY_CARE_PROVIDER_SITE_OTHER): Payer: Medicare Other

## 2012-05-15 DIAGNOSIS — Z7901 Long term (current) use of anticoagulants: Secondary | ICD-10-CM | POA: Diagnosis not present

## 2012-05-15 DIAGNOSIS — I4891 Unspecified atrial fibrillation: Secondary | ICD-10-CM

## 2012-05-15 DIAGNOSIS — IMO0002 Reserved for concepts with insufficient information to code with codable children: Secondary | ICD-10-CM

## 2012-05-18 ENCOUNTER — Telehealth: Payer: Self-pay | Admitting: *Deleted

## 2012-05-18 DIAGNOSIS — E119 Type 2 diabetes mellitus without complications: Secondary | ICD-10-CM

## 2012-05-18 NOTE — Telephone Encounter (Signed)
PATIENT CALLED REQUEST REFERRAL TO ENDOCRINOLOGIST. PATIENT CONCERNED ABOUT HIS DIABETES AND IT IS INTERFERING WITH HIS EYESIGHT. HAS CPE WITH YOU ON 07/13/2012. PLEASE ADVISE

## 2012-05-19 NOTE — Telephone Encounter (Signed)
[facility] notified.  

## 2012-05-19 NOTE — Telephone Encounter (Signed)
PATIENT NOTIFIED THAT A REFERRAL HAS BEEN SENT TO [facility] FOR ENDOCRINOLOGIST.

## 2012-05-29 DIAGNOSIS — L02619 Cutaneous abscess of unspecified foot: Secondary | ICD-10-CM | POA: Diagnosis not present

## 2012-05-29 DIAGNOSIS — L03119 Cellulitis of unspecified part of limb: Secondary | ICD-10-CM | POA: Diagnosis not present

## 2012-05-29 DIAGNOSIS — E119 Type 2 diabetes mellitus without complications: Secondary | ICD-10-CM | POA: Diagnosis not present

## 2012-05-29 DIAGNOSIS — I4891 Unspecified atrial fibrillation: Secondary | ICD-10-CM | POA: Diagnosis not present

## 2012-05-29 DIAGNOSIS — I10 Essential (primary) hypertension: Secondary | ICD-10-CM | POA: Diagnosis not present

## 2012-06-01 DIAGNOSIS — B351 Tinea unguium: Secondary | ICD-10-CM | POA: Diagnosis not present

## 2012-06-01 DIAGNOSIS — M79609 Pain in unspecified limb: Secondary | ICD-10-CM | POA: Diagnosis not present

## 2012-06-01 DIAGNOSIS — L02619 Cutaneous abscess of unspecified foot: Secondary | ICD-10-CM | POA: Diagnosis not present

## 2012-06-02 ENCOUNTER — Ambulatory Visit (INDEPENDENT_AMBULATORY_CARE_PROVIDER_SITE_OTHER): Payer: Medicare Other | Admitting: Cardiovascular Disease

## 2012-06-02 ENCOUNTER — Encounter: Payer: Self-pay | Admitting: Cardiovascular Disease

## 2012-06-02 VITALS — BP 135/65 | HR 61 | Resp 18 | Ht 70.0 in | Wt 220.8 lb

## 2012-06-02 DIAGNOSIS — IMO0002 Reserved for concepts with insufficient information to code with codable children: Secondary | ICD-10-CM

## 2012-06-02 DIAGNOSIS — I6529 Occlusion and stenosis of unspecified carotid artery: Secondary | ICD-10-CM

## 2012-06-02 DIAGNOSIS — I4891 Unspecified atrial fibrillation: Secondary | ICD-10-CM

## 2012-06-02 DIAGNOSIS — E785 Hyperlipidemia, unspecified: Secondary | ICD-10-CM

## 2012-06-02 DIAGNOSIS — I2510 Atherosclerotic heart disease of native coronary artery without angina pectoris: Secondary | ICD-10-CM | POA: Diagnosis not present

## 2012-06-02 DIAGNOSIS — R9430 Abnormal result of cardiovascular function study, unspecified: Secondary | ICD-10-CM

## 2012-06-02 DIAGNOSIS — I10 Essential (primary) hypertension: Secondary | ICD-10-CM | POA: Diagnosis not present

## 2012-06-02 NOTE — Assessment & Plan Note (Signed)
The patient is maintaining sinus rhythm. He is anticoagulated on warfarin and has been tolerating this well long term.

## 2012-06-02 NOTE — Assessment & Plan Note (Signed)
Lipids are at goal and his current medical program. LDL was 60.

## 2012-06-02 NOTE — Assessment & Plan Note (Signed)
Stable without neurologic symptoms. Will followup carotid duplex at one year interval. Most recent carotid duplex scan was reviewed as above.

## 2012-06-02 NOTE — Assessment & Plan Note (Signed)
The patient is stable without anginal symptoms. However, his EKG shows an age-indeterminate anterolateral infarction which was not present on his previous study. I think he should undergo an exercise Myoview stress test with his new EKG change, especially considering his 14 years out from bypass surgery. He would like me to discuss this with his longtime cardiologist in [place]. I have called Dr. Jens Som and left a message with him. He is supposed to be back early next week and I will await his return call. In the meantime, patient will continue on his current medical program which includes aspirin for antiplatelet therapy, atorvastatin for lipid lowering, and continued treatment of his hypertension and diabetes the

## 2012-06-02 NOTE — Patient Instructions (Addendum)
Your physician wants you to follow-up in: 6 MONTHS. You will receive a reminder letter in the mail two months in advance. If you don't receive a letter, please call our office to schedule the follow-up appointment.  Your physician has requested that you have an exercise stress myoview. For further information please visit [email or web address] Please follow instruction sheet, as given.  Your physician has requested that you have a carotid duplex in March 2014. This test is an ultrasound of the carotid arteries in your neck. It looks at blood flow through these arteries that supply the brain with blood. Allow one hour for this exam. There are no restrictions or special instructions.  Your physician recommends that you continue on your current medications as directed. Please refer to the Current Medication list given to you today.

## 2012-06-02 NOTE — Assessment & Plan Note (Signed)
Blood pressure is well controlled on his current medical program. 

## 2012-06-02 NOTE — Progress Notes (Signed)
HPI:  77 year old gentleman presenting for followup evaluation. The patient has CAD status post CABG in 1999, paroxysmal atrial fibrillation, hypertension, diabetes, and hyperlipidemia. He is chronically anticoagulated with warfarin for his atrial fibrillation.  The patient's most recent carotid duplex was in March 2013 that showed 60-79% right ICA stenosis and 40-59% left ICA stenosis. Lipids from January showed a cholesterol of 142, triglycerides 156, HDL 51, and LDL 60.  The patient has been doing very well. He continues to maintain an active lifestyle with no symptoms. He denies chest pain, chest pressure, dyspnea, edema, orthopnea, or PND. He's developed a sore on his foot and he's been seeing a podiatrist just over the last few days. He's in a soft boot today and is supposed to stay out of his foot for about one week.   Outpatient Encounter Prescriptions as of 06/02/2012  Medication Sig Dispense Refill  . aspirin 81 MG tablet Take 81 mg by mouth daily.        Marland Kitchen atorvastatin (LIPITOR) 10 MG tablet Take 1 tablet (10 mg total) by mouth daily.  90 tablet  1  . felodipine (PLENDIL) 5 MG 24 hr tablet Take 1 tablet (5 mg total) by mouth daily.  90 tablet  1  . fluticasone (VERAMYST) 27.5 MCG/SPRAY nasal spray Place 2 sprays into the nose daily.        Marland Kitchen glimepiride (AMARYL) 4 MG tablet Take 1 tablet (4 mg total) by mouth daily before breakfast.  90 tablet  1  . losartan (COZAAR) 50 MG tablet Take 1 tablet (50 mg total) by mouth 2 (two) times daily.  180 tablet  1  . metformin (FORTAMET) 1000 MG (OSM) 24 hr tablet Take 1 tablet (1,000 mg total) by mouth 2 (two) times daily with a meal.  180 tablet  1  . metoprolol succinate (TOPROL-XL) 25 MG 24 hr tablet Take 0.5 tablets (12.5 mg total) by mouth daily. Take 1/2 tablet daily  45 tablet  1  . montelukast (SINGULAIR) 10 MG tablet Take 1 tablet (10 mg total) by mouth at bedtime.  90 tablet  1  . multivitamin (METANX) 3-35-2 MG TABS tablet Take 1  tablet by mouth daily.  90 tablet  1  . pregabalin (LYRICA) 75 MG capsule Take 1 capsule (75 mg total) by mouth 2 (two) times daily.  180 capsule  1  . Tamsulosin HCl (FLOMAX) 0.4 MG CAPS Take 1 capsule (0.4 mg total) by mouth daily.  90 capsule  1  . warfarin (COUMADIN) 6 MG tablet Take 1 tablet (6 mg total) by mouth as directed.  102 tablet  1    No Known Allergies  Past Medical History  Diagnosis Date  . Diabetes mellitus   . Hypertension   . Hyperlipidemia   . Coronary artery disease     MI '99  . Atrial fibrillation     started 2006  . Peripheral neuropathy   . Macular degeneration     has had intra-orbital injections. Has had laser treatment  . Prostate cancer     XRT 45 tx  . CLL (chronic lymphoblastic leukemia)     ROS: Negative except as per HPI  BP 135/65  Pulse 61  Resp 18  Ht 5\' 10"  (1.778 m)  Wt 100.154 kg (220 lb 12.8 oz)  BMI 31.68 kg/m2  SpO2 91%  PHYSICAL EXAM: Pt is alert and oriented, NAD HEENT: normal Neck: JVP - normal, carotids 2+= without bruits Lungs: CTA bilaterally CV: RRR without  murmur or gallop Abd: soft, NT, Positive BS, no hepatomegaly Ext: 1+ pretibial edema bilaterally, distal pulses intact and equal Skin: warm/dry no rash  EKG:  Normal sinus rhythm with first degree AV block, left axis deviation age indeterminate anterolateral infarct  ASSESSMENT AND PLAN:

## 2012-06-07 ENCOUNTER — Ambulatory Visit (INDEPENDENT_AMBULATORY_CARE_PROVIDER_SITE_OTHER): Payer: Medicare Other | Admitting: Endocrinology

## 2012-06-07 ENCOUNTER — Other Ambulatory Visit (INDEPENDENT_AMBULATORY_CARE_PROVIDER_SITE_OTHER): Payer: Medicare Other

## 2012-06-07 ENCOUNTER — Encounter: Payer: Self-pay | Admitting: Endocrinology

## 2012-06-07 VITALS — BP 124/82 | HR 61 | Temp 97.6°F | Ht 71.0 in | Wt 213.0 lb

## 2012-06-07 DIAGNOSIS — E11621 Type 2 diabetes mellitus with foot ulcer: Secondary | ICD-10-CM

## 2012-06-07 DIAGNOSIS — L97509 Non-pressure chronic ulcer of other part of unspecified foot with unspecified severity: Secondary | ICD-10-CM | POA: Diagnosis not present

## 2012-06-07 DIAGNOSIS — C61 Malignant neoplasm of prostate: Secondary | ICD-10-CM | POA: Diagnosis not present

## 2012-06-07 DIAGNOSIS — E119 Type 2 diabetes mellitus without complications: Secondary | ICD-10-CM | POA: Diagnosis not present

## 2012-06-07 DIAGNOSIS — E1169 Type 2 diabetes mellitus with other specified complication: Secondary | ICD-10-CM | POA: Diagnosis not present

## 2012-06-07 LAB — MICROALBUMIN / CREATININE URINE RATIO
Creatinine,U: 140.7 mg/dL
Microalb, Ur: 27.8 mg/dL — ABNORMAL HIGH (ref 0.0–1.9)

## 2012-06-07 LAB — HEMOGLOBIN A1C: Hgb A1c MFr Bld: 7.1 % — ABNORMAL HIGH (ref 4.6–6.5)

## 2012-06-07 MED ORDER — SITAGLIPTIN PHOSPHATE 100 MG PO TABS: 100.0000 mg | ORAL_TABLET | Freq: Every day | ORAL | Status: DC

## 2012-06-07 MED ORDER — GLUCOSE BLOOD VI STRP: 1.0000 | ORAL_STRIP | Freq: Every day | Status: DC

## 2012-06-07 MED ORDER — GLIMEPIRIDE 1 MG PO TABS: 1.0000 mg | ORAL_TABLET | Freq: Every day | ORAL | Status: DC

## 2012-06-07 NOTE — Patient Instructions (Addendum)
good diet and exercise habits significanly improve the control of your diabetes.  please let me know if you wish to be referred to a dietician.  high blood sugar is very risky to your health.  you should see an eye doctor every year.  You are at higher than average risk for pneumonia and hepatitis-B.  You should be vaccinated against both.   controlling your blood pressure and cholesterol drastically reduces the damage diabetes does to your body.  this also applies to quitting smoking.  please discuss these with your doctor.  you should take an aspirin every day, unless you have been advised by a doctor not to. check your blood sugar once a day.  vary the time of day when you check, between before the 3 meals, and at bedtime.  also check if you have symptoms of your blood sugar being too high or too low.  please keep a record of the readings and bring it to your next appointment here.  please call us sooner if your blood sugar goes below 70, or if you have a lot of readings over 200.   blood tests are being requested for you today.  You will receive a letter with results.  For now, reduce th glimepiride to 1 mg each morning Add januvia, 100 mg each morning. If your blood sugar is high with these changes, we can add "bromocriptine."   Please come back for a follow-up appointment in 3 months.

## 2012-06-07 NOTE — Progress Notes (Signed)
Subjective:    Patient ID: Ralph Gray, male    DOB: 24-Aug-1935, 77 y.o.   MRN: [id number]  HPI pt states few years h/o type 2 DM, complicated by peripheral sensory neuropathy, foot ulcer, CAD and PAD.  he has never been on insulin.  pt says his diet is "fair," and exercise is good.  Pt states a few years of moderate pain at the feet, and associated numbness.  He is under the care of dr Ralph Gray for a foot ulcer.  He says cbg's at home are in the low-100's.  He occasionally has mild hypoglycemia in the afternoon, with activity.   Past Medical History  Diagnosis Date  . Diabetes mellitus   . Hypertension   . Hyperlipidemia   . Coronary artery disease     MI '99  . Atrial fibrillation     started 2006  . Peripheral neuropathy   . Macular degeneration     has had intra-orbital injections. Has had laser treatment  . Prostate cancer     XRT 45 tx  . CLL (chronic lymphoblastic leukemia)     Past Surgical History  Procedure Date  . Cardioversion   . Coronary artery bypass graft '99    4 vessel: [place] [facility].     History   Social History  . Marital Status: Married    Spouse Name: N/A    Number of Children: N/A  . Years of Education: N/A   Occupational History  . Not on file.   Social History Main Topics  . Smoking status: Former Smoker    Quit date: 10/14/1963  . Smokeless tobacco: Never Used  . Alcohol Use: Yes     wine rarely  . Drug Use: No  . Sexually Active: Yes -- Male partner(s)   Other Topics Concern  . Not on file   Social History Narrative   HS[employer], [name], EDD - doctorate in education. Married '64 - 82yr/divorced ( severe bipolar disease); Married '07. 2 sons - '67, '66; 1 dtr - '70. 7 grandchildren. Exercise - 6 days a week: cardio and strengthening. 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 ACP- yes for CPR; no -long term ventilation; no heroic or  futile measures.     Current Outpatient Prescriptions on File Prior to Visit  Medication Sig Dispense Refill  . aspirin 81 MG tablet Take 81 mg by mouth daily.        Marland Kitchen atorvastatin (LIPITOR) 10 MG tablet Take 1 tablet (10 mg total) by mouth daily.  90 tablet  1  . felodipine (PLENDIL) 5 MG 24 hr tablet Take 1 tablet (5 mg total) by mouth daily.  90 tablet  1  . fluticasone (VERAMYST) 27.5 MCG/SPRAY nasal spray Place 2 sprays into the nose daily.        Marland Kitchen glimepiride (AMARYL) 4 MG tablet Take 1 tablet (4 mg total) by mouth daily before breakfast.  90 tablet  1  . losartan (COZAAR) 50 MG tablet Take 1 tablet (50 mg total) by mouth 2 (two) times daily.  180 tablet  1  . metformin (FORTAMET) 1000 MG (OSM) 24 hr tablet Take 1 tablet (1,000 mg total) by mouth 2 (two) times daily with a meal.  180 tablet  1  . metoprolol succinate (TOPROL-XL) 25 MG 24 hr tablet Take 0.5 tablets (12.5 mg total) by mouth daily. Take 1/2 tablet daily  45 tablet  1  .  montelukast (SINGULAIR) 10 MG tablet Take 1 tablet (10 mg total) by mouth at bedtime.  90 tablet  1  . multivitamin (METANX) 3-35-2 MG TABS tablet Take 1 tablet by mouth daily.  90 tablet  1  . pregabalin (LYRICA) 75 MG capsule Take 1 capsule (75 mg total) by mouth 2 (two) times daily.  180 capsule  1  . DISCONTD: warfarin (COUMADIN) 6 MG tablet Take 1 tablet (6 mg total) by mouth as directed.  102 tablet  1    No Known Allergies  Family History  Problem Relation Age of Onset  . Other Mother 79    died  . Cancer Mother     sarcoma  . Heart attack Father 40    died  . Heart disease Father     MI  . Diabetes Neg Hx   . COPD Neg Hx    BP 124/82  Pulse 61  Temp 97.6 F (36.4 C) (Oral)  Ht 5\' 11"  (1.803 m)  Wt 213 lb (96.616 kg)  BMI 29.71 kg/m2  SpO2 96%  Review of Systems denies weight loss, headache, chest pain, sob, n/v, urinary frequency, cramps, excessive diaphoresis, memory loss, depression, rhinorrhea, and easy bruising.  No change  in chronic visual loss, or in leg cramps.    Objective:   Physical Exam VS: see vs page GEN: no distress HEAD: head: no deformity eyes: no periorbital swelling, no proptosis external nose and ears are normal mouth: no lesion seen NECK: supple, thyroid is not enlarged CHEST WALL: no deformity.  Old healed median sternotomy scar.   LUNGS: clear to auscultation.   CV: reg rate and rhythm, no murmur.   ABD: abdomen is soft, nontender.  no hepatosplenomegaly.  not distended.  Small self-reducing midline ventral hernia MUSCULOSKELETAL: muscle bulk and strength are grossly normal.  no obvious joint swelling.  gait is normal and steady EXTEMITIES: sees podiatry.  He has a left wooden shoe.  1+ bilat edema.  There are bilat varicosities, and rust-colored skin.   PULSES: no carotid bruit NEURO:  cn 2-12 grossly intact.   readily moves all 4's.  sensation is intact to touch on all 4's, but decreased from normal on the feet.   SKIN:  Normal texture and temperature.  No rash or suspicious lesion is visible.   NODES:  None palpable at the neck PSYCH: alert, oriented x3.  Does not appear anxious nor depressed.      Lab Results  Component Value Date   HGBA1C 7.1* 06/07/2012      Assessment & Plan:  Type 2 DM; Needs increased rx, if it can be done with a regimen that avoids or minimizes hypoglycemia. Edema.  This is a relative contraindication to actos, but he has many more options. Foot ulcer, due to DM CAD; in this setting, he is at high risk from hypoglycemia

## 2012-06-20 ENCOUNTER — Encounter (HOSPITAL_COMMUNITY): Payer: Medicare Other

## 2012-06-20 DIAGNOSIS — H2510 Age-related nuclear cataract, unspecified eye: Secondary | ICD-10-CM | POA: Diagnosis not present

## 2012-06-20 DIAGNOSIS — H35349 Macular cyst, hole, or pseudohole, unspecified eye: Secondary | ICD-10-CM | POA: Diagnosis not present

## 2012-06-20 DIAGNOSIS — H43829 Vitreomacular adhesion, unspecified eye: Secondary | ICD-10-CM | POA: Diagnosis not present

## 2012-06-20 DIAGNOSIS — H353190 Nonexudative age-related macular degeneration, unspecified eye, stage unspecified: Secondary | ICD-10-CM | POA: Diagnosis not present

## 2012-06-26 ENCOUNTER — Ambulatory Visit (INDEPENDENT_AMBULATORY_CARE_PROVIDER_SITE_OTHER): Payer: Medicare Other | Admitting: *Deleted

## 2012-06-26 ENCOUNTER — Ambulatory Visit (INDEPENDENT_AMBULATORY_CARE_PROVIDER_SITE_OTHER): Payer: Medicare Other | Admitting: Internal Medicine

## 2012-06-26 ENCOUNTER — Observation Stay (HOSPITAL_COMMUNITY): Payer: Medicare Other

## 2012-06-26 ENCOUNTER — Encounter: Payer: Self-pay | Admitting: Internal Medicine

## 2012-06-26 ENCOUNTER — Inpatient Hospital Stay (HOSPITAL_COMMUNITY)
Admission: AD | Admit: 2012-06-26 | Discharge: 2012-06-28 | DRG: 864 | Disposition: A | Discharge status: Home / Self Care | Payer: Medicare Other | Source: Ambulatory Visit | Attending: Internal Medicine | Admitting: Internal Medicine

## 2012-06-26 VITALS — BP 132/80 | HR 86 | Temp 101.8°F | Resp 18 | Wt 218.0 lb

## 2012-06-26 DIAGNOSIS — E785 Hyperlipidemia, unspecified: Secondary | ICD-10-CM

## 2012-06-26 DIAGNOSIS — Z8546 Personal history of malignant neoplasm of prostate: Secondary | ICD-10-CM | POA: Diagnosis not present

## 2012-06-26 DIAGNOSIS — R404 Transient alteration of awareness: Secondary | ICD-10-CM | POA: Diagnosis present

## 2012-06-26 DIAGNOSIS — Z856 Personal history of leukemia: Secondary | ICD-10-CM

## 2012-06-26 DIAGNOSIS — R509 Fever, unspecified: Principal | ICD-10-CM

## 2012-06-26 DIAGNOSIS — I4891 Unspecified atrial fibrillation: Secondary | ICD-10-CM

## 2012-06-26 DIAGNOSIS — Z7901 Long term (current) use of anticoagulants: Secondary | ICD-10-CM

## 2012-06-26 DIAGNOSIS — Z951 Presence of aortocoronary bypass graft: Secondary | ICD-10-CM

## 2012-06-26 DIAGNOSIS — Z87891 Personal history of nicotine dependence: Secondary | ICD-10-CM | POA: Diagnosis not present

## 2012-06-26 DIAGNOSIS — I2510 Atherosclerotic heart disease of native coronary artery without angina pectoris: Secondary | ICD-10-CM | POA: Diagnosis not present

## 2012-06-26 DIAGNOSIS — C9110 Chronic lymphocytic leukemia of B-cell type not having achieved remission: Secondary | ICD-10-CM | POA: Diagnosis not present

## 2012-06-26 DIAGNOSIS — I10 Essential (primary) hypertension: Secondary | ICD-10-CM | POA: Diagnosis present

## 2012-06-26 DIAGNOSIS — IMO0002 Reserved for concepts with insufficient information to code with codable children: Secondary | ICD-10-CM

## 2012-06-26 DIAGNOSIS — M19079 Primary osteoarthritis, unspecified ankle and foot: Secondary | ICD-10-CM | POA: Diagnosis not present

## 2012-06-26 DIAGNOSIS — Z7982 Long term (current) use of aspirin: Secondary | ICD-10-CM

## 2012-06-26 DIAGNOSIS — Z79899 Other long term (current) drug therapy: Secondary | ICD-10-CM

## 2012-06-26 DIAGNOSIS — J9819 Other pulmonary collapse: Secondary | ICD-10-CM | POA: Diagnosis not present

## 2012-06-26 DIAGNOSIS — I517 Cardiomegaly: Secondary | ICD-10-CM | POA: Diagnosis not present

## 2012-06-26 DIAGNOSIS — E119 Type 2 diabetes mellitus without complications: Secondary | ICD-10-CM | POA: Diagnosis not present

## 2012-06-26 HISTORY — DX: Type 2 diabetes mellitus without complications: E11.9

## 2012-06-26 LAB — URINALYSIS, ROUTINE W REFLEX MICROSCOPIC
Bilirubin Urine: NEGATIVE
Nitrite: NEGATIVE
Specific Gravity, Urine: 1.018 (ref 1.005–1.030)
pH: 5 (ref 5.0–8.0)

## 2012-06-26 LAB — GLUCOSE, CAPILLARY
Glucose-Capillary: 162 mg/dL — ABNORMAL HIGH (ref 70–99)
Glucose-Capillary: 170 mg/dL — ABNORMAL HIGH (ref 70–99)

## 2012-06-26 LAB — DIFFERENTIAL
Eosinophils Absolute: 0 10*3/uL (ref 0.0–0.7)
Eosinophils Relative: 0 % (ref 0–5)
Lymphs Abs: 5.1 10*3/uL — ABNORMAL HIGH (ref 0.7–4.0)
Monocytes Relative: 5 % (ref 3–12)
Neutrophils Relative %: 65 % (ref 43–77)

## 2012-06-26 LAB — COMPREHENSIVE METABOLIC PANEL
AST: 30 U/L (ref 0–37)
Albumin: 4.1 g/dL (ref 3.5–5.2)
Calcium: 9.8 mg/dL (ref 8.4–10.5)
Creatinine, Ser: 1.37 mg/dL — ABNORMAL HIGH (ref 0.50–1.35)

## 2012-06-26 LAB — CBC
Hemoglobin: 13.6 g/dL (ref 13.0–17.0)
MCH: 31.3 pg (ref 26.0–34.0)
MCV: 90.1 fL (ref 78.0–100.0)
RBC: 4.35 MIL/uL (ref 4.22–5.81)

## 2012-06-26 LAB — URINE MICROSCOPIC-ADD ON

## 2012-06-26 LAB — POCT INR: INR: 2.2

## 2012-06-26 MED ORDER — LINAGLIPTIN 5 MG PO TABS
5.0000 mg | ORAL_TABLET | Freq: Every day | ORAL | Status: DC
  Administered 2012-06-26 – 2012-06-27 (×2): 5 mg via ORAL
  Filled 2012-06-26 (×3): qty 1

## 2012-06-26 MED ORDER — ASPIRIN 81 MG PO TABS: 81.0000 mg | ORAL_TABLET | Freq: Every day | ORAL | Status: DC

## 2012-06-26 MED ORDER — WARFARIN SODIUM 6 MG PO TABS: 6.0000 mg | ORAL_TABLET | ORAL | Status: DC

## 2012-06-26 MED ORDER — FELODIPINE ER 5 MG PO TB24
5.0000 mg | ORAL_TABLET | Freq: Every day | ORAL | Status: DC
  Administered 2012-06-27: 5 mg via ORAL
  Filled 2012-06-26 (×4): qty 1

## 2012-06-26 MED ORDER — MONTELUKAST SODIUM 10 MG PO TABS
10.0000 mg | ORAL_TABLET | Freq: Every day | ORAL | Status: DC
  Administered 2012-06-26 – 2012-06-27 (×2): 10 mg via ORAL
  Filled 2012-06-26 (×3): qty 1

## 2012-06-26 MED ORDER — LOSARTAN POTASSIUM 50 MG PO TABS
50.0000 mg | ORAL_TABLET | Freq: Two times a day (BID) | ORAL | Status: DC
  Administered 2012-06-26 – 2012-06-27 (×3): 50 mg via ORAL
  Filled 2012-06-26 (×5): qty 1

## 2012-06-26 MED ORDER — ATORVASTATIN CALCIUM 10 MG PO TABS
10.0000 mg | ORAL_TABLET | Freq: Every day | ORAL | Status: DC
  Administered 2012-06-26 – 2012-06-27 (×2): 10 mg via ORAL
  Filled 2012-06-26 (×3): qty 1

## 2012-06-26 MED ORDER — ONDANSETRON HCL 4 MG/2ML IJ SOLN: 4.0000 mg | Freq: Four times a day (QID) | INTRAMUSCULAR | Status: DC | PRN

## 2012-06-26 MED ORDER — METOPROLOL SUCCINATE 12.5 MG HALF TABLET
12.5000 mg | ORAL_TABLET | Freq: Every day | ORAL | Status: DC
  Administered 2012-06-26 – 2012-06-27 (×2): 12.5 mg via ORAL
  Filled 2012-06-26 (×4): qty 1

## 2012-06-26 MED ORDER — CEFTRIAXONE SODIUM 2 G IJ SOLR
2.0000 g | Freq: Once | INTRAMUSCULAR | Status: AC
  Administered 2012-06-26: 2 g via INTRAVENOUS
  Filled 2012-06-26: qty 2

## 2012-06-26 MED ORDER — ASPIRIN 81 MG PO CHEW
81.0000 mg | CHEWABLE_TABLET | Freq: Every day | ORAL | Status: DC
  Administered 2012-06-27: 81 mg via ORAL
  Filled 2012-06-26: qty 1

## 2012-06-26 MED ORDER — DEXTROSE 5 % IV SOLN
500.0000 mg | INTRAVENOUS | Status: DC
  Administered 2012-06-26 – 2012-06-27 (×2): 500 mg via INTRAVENOUS
  Filled 2012-06-26 (×4): qty 500

## 2012-06-26 MED ORDER — PREGABALIN 75 MG PO CAPS
75.0000 mg | ORAL_CAPSULE | Freq: Two times a day (BID) | ORAL | Status: DC
  Administered 2012-06-26 – 2012-06-27 (×3): 75 mg via ORAL
  Filled 2012-06-26 (×3): qty 1

## 2012-06-26 MED ORDER — TAMSULOSIN HCL 0.4 MG PO CAPS
0.8000 mg | ORAL_CAPSULE | Freq: Every day | ORAL | Status: DC
  Administered 2012-06-26 – 2012-06-27 (×2): 0.8 mg via ORAL
  Filled 2012-06-26 (×3): qty 2

## 2012-06-26 MED ORDER — INSULIN ASPART 100 UNIT/ML ~~LOC~~ SOLN
0.0000 [IU] | Freq: Three times a day (TID) | SUBCUTANEOUS | Status: DC
  Administered 2012-06-27 – 2012-06-28 (×2): 2 [IU] via SUBCUTANEOUS
  Administered 2012-06-28: 5 [IU] via SUBCUTANEOUS
  Administered 2012-06-28: 2 [IU] via SUBCUTANEOUS

## 2012-06-26 MED ORDER — SODIUM CHLORIDE 0.9 % IJ SOLN
3.0000 mL | Freq: Two times a day (BID) | INTRAMUSCULAR | Status: DC
  Administered 2012-06-27: 3 mL via INTRAVENOUS

## 2012-06-26 MED ORDER — ASPIRIN EC 81 MG PO TBEC: 81.0000 mg | DELAYED_RELEASE_TABLET | Freq: Every day | ORAL | Status: DC

## 2012-06-26 MED ORDER — WARFARIN - PHARMACIST DOSING INPATIENT: Freq: Every day | Status: DC

## 2012-06-26 MED ORDER — ACETAMINOPHEN 325 MG PO TABS
650.0000 mg | ORAL_TABLET | Freq: Four times a day (QID) | ORAL | Status: DC | PRN
  Administered 2012-06-26: 650 mg via ORAL
  Filled 2012-06-26: qty 2

## 2012-06-26 MED ORDER — ACETAMINOPHEN 650 MG RE SUPP: 650.0000 mg | Freq: Four times a day (QID) | RECTAL | Status: DC | PRN

## 2012-06-26 MED ORDER — WARFARIN SODIUM 6 MG PO TABS
9.0000 mg | ORAL_TABLET | Freq: Once | ORAL | Status: AC
  Administered 2012-06-26: 9 mg via ORAL
  Filled 2012-06-26: qty 1

## 2012-06-26 MED ORDER — ONDANSETRON HCL 4 MG PO TABS: 4.0000 mg | ORAL_TABLET | Freq: Four times a day (QID) | ORAL | Status: DC | PRN

## 2012-06-26 MED ORDER — SODIUM CHLORIDE 0.45 % IV SOLN
INTRAVENOUS | Status: DC
  Administered 2012-06-26: 19:00:00 via INTRAVENOUS

## 2012-06-26 MED ORDER — MAGNESIUM HYDROXIDE 400 MG/5ML PO SUSP: 30.0000 mL | Freq: Every day | ORAL | Status: DC | PRN

## 2012-06-26 NOTE — H&P (Signed)
Ralph Gray is an 77 y.o. male.   Chief Complaint: Sudden on-set of fever, rigors, delirium HPI: Ralph Gray has been in his usual state of health. He recently had a mild abnormality on EKG at his cardiologist and was for a stress test today. He did have a right distal LE infection several weeks ago for which he was treated with antibiotics.  Yesterday per his wife he wasn't himself. This AM he developed high fevers, rigors and became delirious. He presents to the office for evaluation where he is febile, has rigors and seems confused. He is now admitted for Stat IV antibiotics, diagnostic lab and x-ray. He does appear to be cardiac stable.   Past Medical History  Diagnosis Date  . Diabetes mellitus   . Hypertension   . Hyperlipidemia   . Coronary artery disease     MI '99  . Atrial fibrillation     started 2006  . Peripheral neuropathy   . Macular degeneration     has had intra-orbital injections. Has had laser treatment  . Prostate cancer     XRT 45 tx  . CLL (chronic lymphoblastic leukemia)     Past Surgical History  Procedure Date  . Cardioversion   . Coronary artery bypass graft '99    4 vessel: [place] [facility].     Family History  Problem Relation Age of Onset  . Other Mother 33    died  . Cancer Mother     sarcoma  . Heart attack Father 40    died  . Heart disease Father     MI  . Diabetes Neg Hx   . COPD Neg Hx    Social History:  reports that he quit smoking about 48 years ago. He has never used smokeless tobacco. He reports that he drinks alcohol. He reports that he does not use illicit drugs.  Allergies:  Allergies  Allergen Reactions  . Amoxapine And Related    No current facility-administered medications on file prior to encounter.   Current Outpatient Prescriptions on File Prior to Encounter  Medication Sig Dispense Refill  . aspirin 81 MG tablet Take 81 mg by mouth daily.        Marland Kitchen  atorvastatin (LIPITOR) 10 MG tablet Take 1 tablet (10 mg total) by mouth daily.  90 tablet  1  . felodipine (PLENDIL) 5 MG 24 hr tablet Take 1 tablet (5 mg total) by mouth daily.  90 tablet  1  . fluticasone (VERAMYST) 27.5 MCG/SPRAY nasal spray Place 2 sprays into the nose daily.        Marland Kitchen glimepiride (AMARYL) 1 MG tablet Take 1 tablet (1 mg total) by mouth daily before breakfast.  90 tablet  3  . glucose blood (ONE TOUCH ULTRA TEST) test strip 1 each by Other route daily. And lancets 1/day 250.00  100 each  12  . losartan (COZAAR) 50 MG tablet Take 1 tablet (50 mg total) by mouth 2 (two) times daily.  180 tablet  1  . metformin (FORTAMET) 1000 MG (OSM) 24 hr tablet Take 1 tablet (1,000 mg total) by mouth 2 (two) times daily with a meal.  180 tablet  1  . metoprolol succinate (TOPROL-XL) 25 MG 24 hr tablet Take 0.5 tablets (12.5 mg total) by mouth daily. Take 1/2 tablet daily  45 tablet  1  . montelukast (SINGULAIR) 10 MG tablet Take 1 tablet (10 mg total) by mouth at  bedtime.  90 tablet  1  . multivitamin (METANX) 3-35-2 MG TABS tablet Take 1 tablet by mouth daily.  90 tablet  1  . pregabalin (LYRICA) 75 MG capsule Take 1 capsule (75 mg total) by mouth 2 (two) times daily.  180 capsule  1  . sitaGLIPtin (JANUVIA) 100 MG tablet Take 1 tablet (100 mg total) by mouth daily.  90 tablet  3  . Tamsulosin HCl (FLOMAX) 0.4 MG CAPS Take 0.8 mg by mouth daily.      Marland Kitchen warfarin (COUMADIN) 6 MG tablet Take 6 mg by mouth as directed. Take 9mg  on M, W, F and Sat         Results for orders placed in visit on 06/26/12 (from the past 48 hour(s))  POCT INR     Status: Normal   Collection Time   06/26/12 10:41 AM      Component Value Range Comment   INR 2.2        Review of Systems  Constitutional: Positive for fever, chills and malaise/fatigue.  HENT: Negative for hearing loss, congestion, sore throat, neck pain and ear discharge.   Eyes: Negative.   Respiratory: Negative for cough, sputum production,  shortness of breath and stridor.   Cardiovascular: Negative for chest pain, palpitations and leg swelling.  Gastrointestinal: Negative for heartburn, nausea, abdominal pain, constipation and melena.  Genitourinary: Negative for dysuria, urgency, frequency and flank pain.  Musculoskeletal: Positive for myalgias and back pain. Negative for joint pain and falls.  Skin: Negative for itching and rash.  Neurological: Positive for dizziness and weakness. Negative for tremors, speech change, focal weakness and headaches.  Endo/Heme/Allergies: Negative.   Psychiatric/Behavioral: Negative.      Physical Exam  T 101.8 132/80  Hr 86 R 16 Gen'l - older white man who is having rigors and is mildly confused HEENT- EAC/TMs normal, oropharynx is clear Neck - supple, no thyromegaly Nodes - no cervical or supraclavicular or axillary nodes Cor- 2+ radial pulse, RRR, no JVD, no carotid bruits Pulm - good breath sounds, no rales or wheezes Abd- protuberant, no guarding or rebound, BS+ Rectal/genitalia deferred Ext - 1+ edema distal LE, no deformity Neuro - a little delirious - thinks things have been done that haven't, not fully oriented to place or context, needs cuing from wife. CN II-XII grossly intact. Derm - no open lesions. Assessment/Plan 1.ID/ Fever with rigors - patient seems on the verge of sepsis of unknown origin. Plan - observation adm on tele  Antibiotics - ordered STAT - Rocephin 2 g IV   Azithromycin 500 mg IV q 24  PA-Lat CXR  Blood cultures from two sites  CBCD, Cmet, procalcitonin  2. Cardiac - appears stable. Had recent EKG. Hemodynamically stable Plan - further plans once ID problem more stable  3. Diabetes - will hold metformin  Sliding scale coverage  4. Lipids - continue lipitor  5. Anticoagulation - will continue coumadin - pharmacy to consult Illene Regulus 06/26/2012, 5:45 PM

## 2012-06-26 NOTE — Progress Notes (Signed)
ANTICOAGULATION CONSULT NOTE - Initial Consult  Pharmacy Consult for Coumadin Indication: atrial fibrillation  Allergies  Allergen Reactions  . Amoxapine And Related     Patient Measurements: Height: 5\' 11"  (180.3 cm) Weight: 217 lb 4.8 oz (98.567 kg) IBW/kg (Calculated) : 75.3   Vital Signs: Temp: 103.1 F (39.5 C) (09/16 1830) Temp src: Oral (09/16 1830) BP: 159/66 mmHg (09/16 1830) Pulse Rate: 98  (09/16 1830)  Labs:  Basename 06/26/12 1857 06/26/12 1041  HGB 13.6 --  HCT 39.2 --  PLT 122* --  APTT -- --  LABPROT -- --  INR -- 2.2  HEPARINUNFRC -- --  CREATININE 1.37* --  CKTOTAL -- --  CKMB -- --  TROPONINI -- --    Estimated Creatinine Clearance: 55.7 ml/min (by C-G formula based on Cr of 1.37).   Medical History: Past Medical History  Diagnosis Date  . Diabetes mellitus   . Hypertension   . Hyperlipidemia   . Coronary artery disease     MI '99  . Atrial fibrillation     started 2006  . Peripheral neuropathy   . Macular degeneration     has had intra-orbital injections. Has had laser treatment  . Prostate cancer     XRT 45 tx  . CLL (chronic lymphoblastic leukemia)     Medications:  Coumadin 9mg  on Mon-Wed-Fri-Sat; 6mg  on Sun-Tue-Thurs.  He has not taken his dose today.  Assessment: Atrial Fibrillation:  His INR was therapeutic at his office visit this morning.  He is being started on antibiotics which may increase his sensitivity to Coumadin, however his INR was at the lower end of the therapeutic range so he does not require dose adjustment at this time.  Goal of Therapy:  INR 2-3   Plan:  Coumadin 9mg  tonight Daily PT/INR monitoring  Estella Husk, Pharm.D., BCPS Clinical Pharmacist  Phone [phone] Pager [phone] 06/26/2012, 8:44 PM

## 2012-06-26 NOTE — Progress Notes (Signed)
  Subjective:    Patient ID: Ralph Gray, male    DOB: 11-Aug-1935, 77 y.o.   MRN: [id number]  HPI Patient seen acutely for fever with rigors. He is admitted - see admit note.   Review of Systems     Objective:   Physical Exam        Assessment & Plan:

## 2012-06-27 ENCOUNTER — Encounter (HOSPITAL_COMMUNITY): Payer: Medicare Other

## 2012-06-27 ENCOUNTER — Other Ambulatory Visit: Payer: Self-pay | Admitting: Internal Medicine

## 2012-06-27 DIAGNOSIS — C9110 Chronic lymphocytic leukemia of B-cell type not having achieved remission: Secondary | ICD-10-CM | POA: Diagnosis not present

## 2012-06-27 DIAGNOSIS — E119 Type 2 diabetes mellitus without complications: Secondary | ICD-10-CM | POA: Diagnosis not present

## 2012-06-27 DIAGNOSIS — I2510 Atherosclerotic heart disease of native coronary artery without angina pectoris: Secondary | ICD-10-CM | POA: Diagnosis not present

## 2012-06-27 DIAGNOSIS — R509 Fever, unspecified: Secondary | ICD-10-CM | POA: Diagnosis not present

## 2012-06-27 LAB — HEMOGLOBIN A1C: Mean Plasma Glucose: 148 mg/dL — ABNORMAL HIGH (ref <117)

## 2012-06-27 LAB — BASIC METABOLIC PANEL
BUN: 23 mg/dL (ref 6–23)
Chloride: 100 mEq/L (ref 96–112)
GFR calc Af Amer: 58 mL/min — ABNORMAL LOW (ref >90)
Potassium: 4.2 mEq/L (ref 3.5–5.1)
Sodium: 134 mEq/L — ABNORMAL LOW (ref 135–145)

## 2012-06-27 LAB — GLUCOSE, CAPILLARY: Glucose-Capillary: 169 mg/dL — ABNORMAL HIGH (ref 70–99)

## 2012-06-27 LAB — CBC
HCT: 36 % — ABNORMAL LOW (ref 39.0–52.0)
Hemoglobin: 12.6 g/dL — ABNORMAL LOW (ref 13.0–17.0)
MCHC: 35 g/dL (ref 30.0–36.0)
RBC: 4.02 MIL/uL — ABNORMAL LOW (ref 4.22–5.81)
WBC: 15.8 10*3/uL — ABNORMAL HIGH (ref 4.0–10.5)

## 2012-06-27 LAB — PROTIME-INR
INR: 1.74 — ABNORMAL HIGH (ref 0.00–1.49)
Prothrombin Time: 20.7 seconds — ABNORMAL HIGH (ref 11.6–15.2)

## 2012-06-27 MED ORDER — WARFARIN SODIUM 2.5 MG PO TABS
12.5000 mg | ORAL_TABLET | Freq: Once | ORAL | Status: AC
  Administered 2012-06-27: 12.5 mg via ORAL
  Filled 2012-06-27: qty 1

## 2012-06-27 NOTE — Care Management Note (Unsigned)
    Page 1 of 1   06/27/2012     12:03:16 PM   CARE MANAGEMENT NOTE 06/27/2012  Patient:  Ralph Gray, Ralph Gray   Account Number:  [id number]  Date Initiated:  06/27/2012  Documentation initiated by:  GRAVES-BIGELOW,Akif Weldy  Subjective/Objective Assessment:   Pt admitted with fevers.Treating with IV ABX.  Pt is from home with family.     Action/Plan:   CM will continue to monitor for disposition needs.   Anticipated DC Date:  06/28/2012   Anticipated DC Plan:  HOME/SELF CARE      DC Planning Services  CM consult      Choice offered to / List presented to:             Status of service:  In process, will continue to follow Medicare Important Message given?   (If response is "NO", the following Medicare IM given date fields will be blank) Date Medicare IM given:   Date Additional Medicare IM given:    Discharge Disposition:    Per UR Regulation:  Reviewed for med. necessity/level of care/duration of stay  If discussed at Long Length of Stay Meetings, dates discussed:    Comments:

## 2012-06-27 NOTE — Progress Notes (Signed)
Subjective: Feels a lot better. Oriented but does not remember seeing me in the office yesterday  Objective: Lab: Lab Results  Component Value Date   WBC 17.2* 06/26/2012   HGB 13.6 06/26/2012   HCT 39.2 06/26/2012   MCV 90.1 06/26/2012   PLT 122* 06/26/2012   BMET    Component Value Date/Time   NA 139 06/26/2012 1857   K 5.0 06/26/2012 1857   CL 102 06/26/2012 1857   CO2 23 06/26/2012 1857   GLUCOSE 175* 06/26/2012 1857   BUN 23 06/26/2012 1857   CREATININE 1.37* 06/26/2012 1857   CALCIUM 9.8 06/26/2012 1857   GFRNONAA 49* 06/26/2012 1857   GFRAA 57* 06/26/2012 1857   U/A - negative  \blood cultures pending  Imaging: CXR 2 view 06/26/12 - NAD  Scheduled Meds:   . aspirin  81 mg Oral Daily  . atorvastatin  10 mg Oral QHS  . azithromycin  500 mg Intravenous Q24H  . cefTRIAXone (ROCEPHIN)  IV  2 g Intravenous Once  . felodipine  5 mg Oral Daily  . insulin aspart  0-15 Units Subcutaneous TID WC  . linagliptin  5 mg Oral Daily  . losartan  50 mg Oral BID  . metoprolol succinate  12.5 mg Oral Daily  . montelukast  10 mg Oral QHS  . pregabalin  75 mg Oral BID  . sodium chloride  3 mL Intravenous Q12H  . Tamsulosin HCl  0.8 mg Oral Daily  . warfarin  9 mg Oral Once  . Warfarin - Pharmacist Dosing Inpatient   Does not apply q1800  . DISCONTD: aspirin EC  81 mg Oral Daily  . DISCONTD: aspirin  81 mg Oral Daily  . DISCONTD: warfarin  6 mg Oral UD   Continuous Infusions:   . sodium chloride 75 mL/hr at 06/26/12 1911   PRN Meds:.acetaminophen, acetaminophen, magnesium hydroxide, ondansetron (ZOFRAN) IV, ondansetron   Physical Exam: Filed Vitals:   06/27/12 0500  BP: 145/70  Pulse: 65  Temp: 99.2 F (37.3 C)  Resp: 18    Intake/Output Summary (Last 24 hours) at 06/27/12 0630 Last data filed at 06/27/12 0400  Gross per 24 hour  Intake 826.25 ml  Output    750 ml  Net  76.25 ml   Gen'l - older white man in no distress HEEENT- C&S clear Cor- 2+ radial RRR PUlm - good  breath sounds, no rales or wheezes Abd - soft, BS+, no guarding or rebound Neuro - A&O x 3, cognition seems normal  Intake/Output Summary (Last 24 hours) at 06/27/12 0626 Last data filed at 06/27/12 0400  Gross per 24 hour  Intake 826.25 ml  Output    750 ml  Net  76.25 ml         Assessment/Plan: 1. ID/Fever - temp is down. X-ray, U/A negative, blood cultures pending. With CLL baseline WBC about 14 so he had real elevation but differential looks normal. DAy # 2 rocephin/azithromycin  Plan  If he does well during the day consider d/c this PM on oral antibiotics  2. Cardiac - no complaints. Tele normal.  3. DM -  CBG (last 3)   Basename 06/26/12 2043 06/26/12 1843  GLUCAP 170* 162*    4. Hematology - stable CLL   Illene Regulus [facility] (o) [phone]; (c) [phone] Call-grp - [facility] Tele: [phone]  06/27/2012, 6:26 AM

## 2012-06-27 NOTE — Progress Notes (Signed)
ANTICOAGULATION CONSULT NOTE - Follow Up Consult  Pharmacy Consult for Coumadin Indication: atrial fibrillation  Allergies  Allergen Reactions  . Amoxapine And Related     Patient Measurements: Height: 5\' 11"  (180.3 cm) Weight: 215 lb (97.523 kg) IBW/kg (Calculated) : 75.3  Heparin Dosing Weight:    Vital Signs: Temp: 99.2 F (37.3 C) (09/17 0500) Temp src: Oral (09/17 0500) BP: 145/70 mmHg (09/17 0500) Pulse Rate: 65  (09/17 0500)  Labs:  Basename 06/27/12 0558 06/26/12 1857 06/26/12 1041  HGB 12.6* 13.6 --  HCT 36.0* 39.2 --  PLT 123* 122* --  APTT -- -- --  LABPROT 20.7* -- --  INR 1.74* -- 2.2  HEPARINUNFRC -- -- --  CREATININE 1.34 1.37* --  CKTOTAL -- -- --  CKMB -- -- --  TROPONINI -- -- --    Estimated Creatinine Clearance: 56.7 ml/min (by C-G formula based on Cr of 1.34).   Assessment: Fever, hypotension, developing sepsis. Started on Rocephin/Azith.  Anticoag: Atrial Fibrillation: INR was therapeutic at MD office visit 9/16  (2.2) on 9mg  MWFsat, 6mg  TThSun. INR down to 1.74 this am.  ID: UA negative. Tmax 103.1 but down to 99.2 this am. WBC down 17.2 to 15.8 on day #2 Rocephin/Azithro   Goal of Therapy:  INR 2-3 Monitor platelets by anticoagulation protocol: Yes   Plan:  Increase Coumadin to 12.5mg  po x 1 today.  Merilynn Finland, Levi Biela 06/27/2012,8:31 AM

## 2012-06-27 NOTE — Progress Notes (Signed)
PM note: Good day. No fevers, feels better. On re-examination of the left foot the callus plantar aspect 5th digit is without drainage but is red and warm  Plan - MRI left foot tonight.

## 2012-06-27 NOTE — Progress Notes (Signed)
UR Completed Jencarlo Bonadonna Graves-Bigelow, RN,BSN [phone]  

## 2012-06-28 ENCOUNTER — Encounter (HOSPITAL_COMMUNITY): Payer: Self-pay | Admitting: General Practice

## 2012-06-28 ENCOUNTER — Inpatient Hospital Stay (HOSPITAL_COMMUNITY): Payer: Medicare Other

## 2012-06-28 DIAGNOSIS — M19079 Primary osteoarthritis, unspecified ankle and foot: Secondary | ICD-10-CM | POA: Diagnosis not present

## 2012-06-28 DIAGNOSIS — R509 Fever, unspecified: Secondary | ICD-10-CM | POA: Diagnosis not present

## 2012-06-28 DIAGNOSIS — C9110 Chronic lymphocytic leukemia of B-cell type not having achieved remission: Secondary | ICD-10-CM | POA: Diagnosis not present

## 2012-06-28 LAB — GLUCOSE, CAPILLARY: Glucose-Capillary: 217 mg/dL — ABNORMAL HIGH (ref 70–99)

## 2012-06-28 MED ORDER — LINAGLIPTIN 5 MG PO TABS
5.0000 mg | ORAL_TABLET | Freq: Every day | ORAL | Status: DC
  Administered 2012-06-28: 5 mg via ORAL
  Filled 2012-06-28 (×2): qty 1

## 2012-06-28 MED ORDER — CEFUROXIME AXETIL 500 MG PO TABS: 500.0000 mg | ORAL_TABLET | Freq: Two times a day (BID) | ORAL | Status: DC

## 2012-06-28 MED ORDER — PREGABALIN 75 MG PO CAPS
75.0000 mg | ORAL_CAPSULE | ORAL | Status: DC
  Administered 2012-06-28: 75 mg via ORAL
  Filled 2012-06-28: qty 1

## 2012-06-28 MED ORDER — LOSARTAN POTASSIUM 50 MG PO TABS
50.0000 mg | ORAL_TABLET | ORAL | Status: DC
  Administered 2012-06-28: 50 mg via ORAL
  Filled 2012-06-28 (×3): qty 1

## 2012-06-28 MED ORDER — ASPIRIN 81 MG PO CHEW
81.0000 mg | CHEWABLE_TABLET | Freq: Every day | ORAL | Status: DC
  Administered 2012-06-28: 81 mg via ORAL
  Filled 2012-06-28: qty 1

## 2012-06-28 MED ORDER — WARFARIN SODIUM 2.5 MG PO TABS
12.5000 mg | ORAL_TABLET | Freq: Once | ORAL | Status: AC
  Administered 2012-06-28: 12.5 mg via ORAL
  Filled 2012-06-28: qty 1

## 2012-06-28 MED ORDER — GADOBENATE DIMEGLUMINE 529 MG/ML IV SOLN
20.0000 mL | Freq: Once | INTRAVENOUS | Status: AC
  Administered 2012-06-28: 20 mL via INTRAVENOUS

## 2012-06-28 MED ORDER — INFLUENZA VIRUS VACC SPLIT PF IM SUSP: 0.5000 mL | INTRAMUSCULAR | Status: DC

## 2012-06-28 MED ORDER — TAMSULOSIN HCL 0.4 MG PO CAPS
0.8000 mg | ORAL_CAPSULE | Freq: Every day | ORAL | Status: DC
  Filled 2012-06-28 (×2): qty 2

## 2012-06-28 MED ORDER — FELODIPINE ER 5 MG PO TB24
5.0000 mg | ORAL_TABLET | Freq: Every day | ORAL | Status: DC
  Administered 2012-06-28: 5 mg via ORAL
  Filled 2012-06-28 (×2): qty 1

## 2012-06-28 MED ORDER — METOPROLOL SUCCINATE 12.5 MG HALF TABLET
12.5000 mg | ORAL_TABLET | Freq: Every day | ORAL | Status: DC
  Administered 2012-06-28: 12.5 mg via ORAL
  Filled 2012-06-28 (×2): qty 1

## 2012-06-28 NOTE — Discharge Summary (Signed)
NAMENATHANUAL, RASK NO.:  [id number]  MEDICAL RECORD NO.:  [id number]  LOCATION:  3W32C                        FACILITY:  [facility]  PHYSICIAN:  Rosalyn Gess. Hayes Czaja, MD  DATE OF BIRTH:  [birth year]  DATE OF ADMISSION:  06/26/2012 DATE OF DISCHARGE:                              DISCHARGE SUMMARY   ADDENDUM  In reviewing the patient's order record at the time of discharge, he did receive 2 g of Rocephin at the time of admission.  He did not receive any additional doses of ceftriaxone over the next 2 days.  He did do well on azithromycin alone, remained afebrile.  He will continue on Ceftin 500 mg b.i.d. for an additional 7 days of coverage.  It is noted that he had a reaction to penicillins in the past but with crossover rate of 10% or less with 4th generation cephalosporins and that he tolerated IV ceftriaxone ceftin will prescribe as above     Casimiro Needle E. Kenlynn Houde, MD     MEN/MEDQ  D:  06/28/2012  T:  06/28/2012  Job:  [id number]

## 2012-06-28 NOTE — Progress Notes (Signed)
ANTICOAGULATION CONSULT NOTE - Follow Up Consult  Pharmacy Consult for Coumadin Indication: atrial fibrillation  Allergies  Allergen Reactions  . Amoxicillin Anaphylaxis  . Other     Muscle Relaxers    Patient Measurements: Height: 5\' 11"  (180.3 cm) Weight: 215 lb 4.8 oz (97.659 kg) IBW/kg (Calculated) : 75.3  Heparin Dosing Weight:    Vital Signs: Temp: 98.1 F (36.7 C) (09/18 0500) Temp src: Oral (09/18 0500) BP: 146/75 mmHg (09/18 0500) Pulse Rate: 56  (09/18 0500)  Labs:  Basename 06/28/12 0530 06/27/12 0558 06/26/12 1857 06/26/12 1041  HGB -- 12.6* 13.6 --  HCT -- 36.0* 39.2 --  PLT -- 123* 122* --  APTT -- -- -- --  LABPROT 21.6* 20.7* -- --  INR 1.96* 1.74* -- 2.2  HEPARINUNFRC -- -- -- --  CREATININE -- 1.34 1.37* --  CKTOTAL -- -- -- --  CKMB -- -- -- --  TROPONINI -- -- -- --    Estimated Creatinine Clearance: 56.8 ml/min (by C-G formula based on Cr of 1.34).   Assessment: Fever, hypotension, developing sepsis. Started on Rocephin/Azith.  Anticoag: Atrial Fibrillation: INR was therapeutic at MD office visit 9/16  (2.2) on 9mg  MWFsat, 6mg  TThSun. INR 1.96 today.  ID: UA negative. Afebrile. WBC down 17.2 to 15.8 on day #3 Azithro. Left foot the callus plantar aspect 5th digit is without drainage but is red and warm.  Cards: 146/75,56 on ASA 81mg , Lipitor, felodipine, losartan, metoprolol,  Endo: DM with CBGs 140-169 on Tradjenta and SSI  Neuro: Lyrica  Pulm: Singulair    Goal of Therapy:  INR 2-3 Monitor platelets by anticoagulation protocol: Yes   Plan:  Increase Coumadin to 12.5mg  po x 1 again today.  Misty Stanley Stillinger 06/28/2012,8:24 AM

## 2012-06-28 NOTE — Discharge Summary (Signed)
Ralph Gray, PULLING NO.:  [id number]  MEDICAL RECORD NO.:  [id number]  LOCATION:  3W32C                        FACILITY:  [facility]  PHYSICIAN:  Rosalyn Gess. Norins, MD  DATE OF BIRTH:  [birth year]  DATE OF ADMISSION:  06/26/2012 DATE OF DISCHARGE:  06/28/2012                              DISCHARGE SUMMARY   ADMITTING DIAGNOSIS:  Fever with rigors.  DISCHARGE DIAGNOSIS:  Fever with rigors of unknown origin.  HISTORY OF PRESENT ILLNESS:  [name] is in his usual state of health until the evening before the day of admission when he started to feel a little off not his usual self.  On the morning of the day of admission, he spiked a high fever and started having a significant problem with rigors.  He called his wife and she determined he was very confused.  He was seen urgently in the office in the afternoon on the day of admission.  He had a fever of 101.8 at that time.  He had definite rigors.  His physical exam was unrevealing.  Given his advanced age, high fever and rigors, he was admitted to the hospital for evaluation and for IV antibiotics administered on a stat basis. Please see the H and P for past medical history, family history, social history, and admission examination.  HOSPITAL COURSE:  Fever.  The patient was started urgently on IV Rocephin at 2 g then 1 g q.24, started on azithromycin 500 mg IV daily. Blood cultures x2 were drawn and were negative at the time of discharge. UA was negative.  The patient had a chest x-ray, PA and lateral, that was unremarkable.  The patient did have an MRI of his left foot where he had a callus at the plantar aspect of the 1st MTP joint that was warm to touch.  This MRI was negative for any signs of cellulitis, abscess or osteomyelitis. The patient quickly defervesced.  His initial white count was 17,200 in the setting of CLL where usually runs around 14,000.  The patient's mental status cleared dramatically.   Interestingly, he reported not remembering seeing me in the office on the day of admission.  He remained afebrile.  All studies were negative as noted.  At this point, the patient has had 3 days of IV azithromycin and he has had a full 3 doses of Rocephin.  At this point, he is ready for discharge home to complete oral antibiotic course for additional 7 days using Ceftin 500 mg b.i.d. The patient was examined and also discussed with him the lack of unidentified source of infection.  DISCHARGE PHYSICAL EXAMINATION:  VITAL SIGNS:  Temperature was 97.4, blood pressure 120/62, pulse was 60, respirations 18, oxygen saturation was 96%. GENERAL APPEARANCE:  This is a stocky well-nourished Caucasian male in no acute distress. HEENT:  Conjunctiva and sclerae were clear.  CHEST:  The patient is moving air well with no rales, wheezes or rhonchi. CARDIOVASCULAR:  2+ radial pulse.  He had a quiet precordium with a regular rate and rhythm. ABDOMEN:  Obese, soft.  No guarding or rebound was noted. GENITALIA:  Deferred. EXTREMITIES:  No clubbing, cyanosis, or edema. DERMATOLOGY:  The patient still has  a small callus at the dorsal aspect of the 1st MTP joint.  FINAL LABORATORY:  INR was 1.96 on the day of discharge.  Final CBC from June 27, 2012, with a white count of 15,800, hemoglobin 12.6 g, platelet count 123,000.  Differential from the 16th on his white count of 17,200 with 65% segs, 30% lymphs, 5% monocytes.  No chemistries were ordered.  Imaging studies revealed chest x-ray with cardiomegaly without acute disease.  MRI of the left foot, final impression with no evidence of soft tissue, abscess or osteomyelitis or joint effusion.  Slight enhancement of subcutaneous fat along the plantar aspect of the ball of foot at the level of the 2nd and 3rd toes, nonspecific and most likely not significant.  DISPOSITION:  The patient is to be discharged to home.  He is to continue all of his  medication and resume metformin.  He is to continue to monitor his blood sugars as before. The patient had been scheduled for a nuclear stress study that was to be on his first hospital day and this was canceled.  The patient is instructed to contact the Cardiology Division to reschedule his stress test in approximately 7 days and he has completed his antibiotic course. The patient's condition at time of discharge dictation is markedly improved and hemodynamically stable.     Rosalyn Gess Norins, MD     MEN/MEDQ  D:  06/28/2012  T:  06/28/2012  Job:  [id number]  cc:   Veverly Fells. Excell Seltzer, MD

## 2012-06-29 ENCOUNTER — Telehealth: Payer: Self-pay | Admitting: Internal Medicine

## 2012-06-29 MED ORDER — CEFUROXIME AXETIL 500 MG PO TABS: 500.0000 mg | ORAL_TABLET | Freq: Two times a day (BID) | ORAL | Status: DC

## 2012-06-29 NOTE — Telephone Encounter (Signed)
Spoke with patient concerning medication. States he called [facility] and Rx was transferred to his local pharmacy [facility] and has picked it up and took first dose.  Thanked [place] for checking up on this.

## 2012-06-29 NOTE — Telephone Encounter (Signed)
Pt has called both pharmacies and [facility] may be able to transfer the RX from [facility].

## 2012-06-29 NOTE — Telephone Encounter (Signed)
EPIC glitch - please call in ceftin 500mg  bid x 5 days, #10 to his local pharmacy of choice.

## 2012-06-29 NOTE — Telephone Encounter (Signed)
Patient needs [place] to cancel the medication, Ceftin, with [facility] and call the medication into [facility] on [address], because he will not get the medication for another week if he waits on [facility]

## 2012-06-29 NOTE — Telephone Encounter (Signed)
Patient needs us to cancel the medication, Ceftin, with [facility] and call the medication into [facility] on [address], because he will not get the medication for another week if he waits on [facility] ° ° °

## 2012-07-01 ENCOUNTER — Other Ambulatory Visit: Payer: Self-pay | Admitting: Internal Medicine

## 2012-07-03 LAB — CULTURE, BLOOD (ROUTINE X 2): Culture: NO GROWTH

## 2012-07-04 ENCOUNTER — Encounter (HOSPITAL_COMMUNITY): Payer: Medicare Other

## 2012-07-05 ENCOUNTER — Encounter: Payer: Self-pay | Admitting: Internal Medicine

## 2012-07-05 ENCOUNTER — Ambulatory Visit (INDEPENDENT_AMBULATORY_CARE_PROVIDER_SITE_OTHER): Payer: Medicare Other | Admitting: Internal Medicine

## 2012-07-05 VITALS — BP 116/62 | HR 60 | Temp 97.9°F | Resp 16 | Wt 219.0 lb

## 2012-07-05 DIAGNOSIS — R509 Fever, unspecified: Secondary | ICD-10-CM

## 2012-07-05 MED ORDER — PREGABALIN 75 MG PO CAPS: 75.0000 mg | ORAL_CAPSULE | Freq: Two times a day (BID) | ORAL | Status: DC

## 2012-07-05 MED ORDER — ATORVASTATIN CALCIUM 10 MG PO TABS: 10.0000 mg | ORAL_TABLET | Freq: Every day | ORAL | Status: DC

## 2012-07-05 NOTE — Progress Notes (Signed)
Subjective:    Patient ID: Ralph Gray, male    DOB: 01/02/1935, 77 y.o.   MRN: [id number]  HPI Ralph Gray with recent hospitalization for fever and rigors. His hospital evaluation was unrevealing including labs, u/a, CXR, MRI foot to r/o osteomyelitis. He was treated with azithromycin and rocephin 2 g. He became afebrile and his white count, usually elevated due to CLL came down to his normal baseline. He did receive full course of azithromycin while in hospital and is finishing ceftin. Review of all hospital lab results - as above, plus the blood cultures were negative.  Since d/c he has felt well. He has not had any recurrent fevers, chills or any other sign of illness.  PMH, FamHx and SocHx reviewed for any changes and relevance.  Current Outpatient Prescriptions on File Prior to Visit  Medication Sig Dispense Refill  . aspirin 81 MG tablet Take 81 mg by mouth daily.        . felodipine (PLENDIL) 5 MG 24 hr tablet Take 1 tablet (5 mg total) by mouth daily.  90 tablet  1  . fluticasone (VERAMYST) 27.5 MCG/SPRAY nasal spray Place 2 sprays into the nose daily.        Marland Kitchen glimepiride (AMARYL) 1 MG tablet Take 1 tablet (1 mg total) by mouth daily before breakfast.  90 tablet  3  . glucose blood (ONE TOUCH ULTRA TEST) test strip 1 each by Other route daily. And lancets 1/day 250.00  100 each  12  . L-Methylfolate-Algae-B12-B6 (METANX) 3-90.314-2-35 MG CAPS TAKE 1 CAPSULE DAILY  90 capsule  3  . losartan (COZAAR) 50 MG tablet TAKE 1 TABLET TWICE A DAY  180 tablet  0  . metformin (FORTAMET) 1000 MG (OSM) 24 hr tablet Take 2,000 mg by mouth daily with breakfast.      . metoprolol succinate (TOPROL-XL) 25 MG 24 hr tablet Take 0.5 tablets (12.5 mg total) by mouth daily. Take 1/2 tablet daily  45 tablet  1  . montelukast (SINGULAIR) 10 MG tablet Take 1 tablet (10 mg total) by mouth at bedtime.  90 tablet  1  . sitaGLIPtin (JANUVIA) 100 MG tablet Take 1 tablet (100 mg total) by mouth daily.  90  tablet  3  . Tamsulosin HCl (FLOMAX) 0.4 MG CAPS Take 0.8 mg by mouth daily.      Marland Kitchen warfarin (COUMADIN) 6 MG tablet Take 6-9 mg by mouth daily. Takes 9 mg on Monday, Wednesday, Friday and Saturday and 6 mg all other days      . DISCONTD: atorvastatin (LIPITOR) 10 MG tablet Take 1 tablet (10 mg total) by mouth daily.  90 tablet  1  . DISCONTD: pregabalin (LYRICA) 75 MG capsule Take 1 capsule (75 mg total) by mouth 2 (two) times daily.  180 capsule  1  . cefUROXime (CEFTIN) 500 MG tablet Take 1 tablet (500 mg total) by mouth 2 (two) times daily.  14 tablet  0  . DISCONTD: multivitamin (METANX) 3-35-2 MG TABS tablet Take 1 tablet by mouth daily.  90 tablet  1      Review of Systems System review is negative for any constitutional, cardiac, pulmonary, GI or neuro symptoms or complaints other than as described in the HPI.     Objective:   Physical Exam Filed Vitals:   07/05/12 1325  BP: 116/62  Pulse: 60  Temp: 97.9 F (36.6 C)  Resp: 16   WNWD overweight white man in no distress Cor - 2+ pulse Pulm -  normal respirations Neuro - normal       Assessment & Plan:  1. Fever with impending sepsis - no source identified. He did respond to antibiotic therapy.  Plan - continue all present maintenance medications  Watch for any wounds or sores feet, legs etc that could serve as portal of infection.  Safe to travel.

## 2012-07-05 NOTE — Patient Instructions (Addendum)
Infection - still do not know where the infection was or what caused the infection. The important thing is that you responded to treatment and you seem to now be well. There is no way to know if this will happen again but it is unlikely.   No special measures to protect yourself except to keep you sugar under control, no be sure you don't develop any sores or ulcers and not to manipulate any lesions.

## 2012-07-11 ENCOUNTER — Ambulatory Visit (HOSPITAL_COMMUNITY): Payer: Medicare Other | Attending: Cardiology | Admitting: Radiology

## 2012-07-11 VITALS — BP 137/67 | HR 58 | Ht 70.0 in | Wt 213.0 lb

## 2012-07-11 DIAGNOSIS — I4949 Other premature depolarization: Secondary | ICD-10-CM | POA: Diagnosis not present

## 2012-07-11 DIAGNOSIS — E119 Type 2 diabetes mellitus without complications: Secondary | ICD-10-CM | POA: Diagnosis not present

## 2012-07-11 DIAGNOSIS — I2510 Atherosclerotic heart disease of native coronary artery without angina pectoris: Secondary | ICD-10-CM | POA: Diagnosis not present

## 2012-07-11 DIAGNOSIS — R9431 Abnormal electrocardiogram [ECG] [EKG]: Secondary | ICD-10-CM | POA: Diagnosis not present

## 2012-07-11 DIAGNOSIS — R9430 Abnormal result of cardiovascular function study, unspecified: Secondary | ICD-10-CM

## 2012-07-11 DIAGNOSIS — IMO0002 Reserved for concepts with insufficient information to code with codable children: Secondary | ICD-10-CM

## 2012-07-11 DIAGNOSIS — I999 Unspecified disorder of circulatory system: Secondary | ICD-10-CM | POA: Insufficient documentation

## 2012-07-11 DIAGNOSIS — I739 Peripheral vascular disease, unspecified: Secondary | ICD-10-CM | POA: Diagnosis not present

## 2012-07-11 DIAGNOSIS — Z8249 Family history of ischemic heart disease and other diseases of the circulatory system: Secondary | ICD-10-CM | POA: Diagnosis not present

## 2012-07-11 DIAGNOSIS — I10 Essential (primary) hypertension: Secondary | ICD-10-CM | POA: Insufficient documentation

## 2012-07-11 MED ORDER — TECHNETIUM TC 99M SESTAMIBI GENERIC - CARDIOLITE
10.0000 | Freq: Once | INTRAVENOUS | Status: AC | PRN
  Administered 2012-07-11: 10 via INTRAVENOUS

## 2012-07-11 MED ORDER — TECHNETIUM TC 99M SESTAMIBI GENERIC - CARDIOLITE
30.0000 | Freq: Once | INTRAVENOUS | Status: AC | PRN
  Administered 2012-07-11: 30 via INTRAVENOUS

## 2012-07-11 NOTE — Progress Notes (Signed)
[facility] SITE 3 NUCLEAR MED [address] [id number] [place] [address] [phone]  Cardiology Nuclear Med Study  Ralph Gray is a 77 y.o. male     MRN : [id number]     DOB: [birth year]  Procedure Date: 07/11/2012  Nuclear Med Background Indication for Stress Test:  Evaluation for Ischemia, Graft Patency and Abnormal EKG History:  '99 MI>CABG; '10 Echo:EF=61%; ~'11 MPS:OK per patient; h/o PAF  Cardiac Risk Factors: Carotid Disease, Family History - CAD, History of Smoking, Hypertension, Lipids, NIDDM and PAD  Symptoms:  No cardiac complaints.   Nuclear Pre-Procedure Caffeine/Decaff Intake:  None > 12 hrs NPO After: 8:30pm   Lungs:  Clear. O2 Sat: 96% on room air. IV 0.9% NS with Angio Cath:  20g  IV Site: R Antecubital x 1, tolerated well IV Started by:  Irean Hong, RN  Chest Size (in):  44 Cup Size: n/a  Height: 5\' 10"  (1.778 m)  Weight:  213 lb (96.616 kg)  BMI:  Body mass index is 30.56 kg/(m^2). Tech Comments:  Toprol held  x 36 hours    Nuclear Med Study 1 or 2 day study: 1 day  Stress Test Type:  Stress  Reading MD: Marca Ancona, MD  Order Authorizing Provider:  Tonny Bollman, MD  Resting Radionuclide: Technetium 9m Sestamibi  Resting Radionuclide Dose: 11.0 mCi   Stress Radionuclide:  Technetium 79m Sestamibi  Stress Radionuclide Dose: 33.0 mCi           Stress Protocol Rest HR: 58 Stress HR: 125  Rest BP: 137/67 Stress BP: 178/57  Exercise Time (min): 8:30 METS: 10.1   Predicted Max HR: 145 bpm % Max HR: 86.21 bpm Rate Pressure Product: 40102   Dose of Adenosine (mg):  n/a Dose of Lexiscan: n/a mg  Dose of Atropine (mg): n/a Dose of Dobutamine: n/a mcg/kg/min (at max HR)  Stress Test Technologist: Smiley Houseman, CMA-N  Nuclear Technologist:  Domenic Polite, CNMT     Rest Procedure:  Myocardial perfusion imaging was performed at rest 45 minutes following the intravenous administration of Technetium 59m  Sestamibi.  Rest ECG: 1st degree AVB, LAD and prior ASWMI  Stress Procedure:  The patient exercised on the treadmill utilizing the Bruce protocol for 8:30 minutes. He then stopped due to fatigue and denied any chest pain.  There were no diagnostic ST-T wave changes, occasional PVC's and PAC's were noted.  Technetium 51m Sestamibi was injected at peak exercise and myocardial perfusion imaging was performed after a brief delay.  Stress ECG: No significant change from baseline ECG  QPS Raw Data Images:  Normal; no motion artifact; normal heart/lung ratio. Stress Images:  Small to moderate-sized, mild mid to apical anterior perfusion defect.  Rest Images:  Small, mild apical anterior perfusion defect.  Subtraction (SDS):  Small to moderate mid to apical anterior mild perfusion defect noted on stress that is partially reversible.  Transient Ischemic Dilatation (Normal <1.22): 1.08 Lung/Heart Ratio (Normal <0.45):  0.45  Quantitative Gated Spect Images QGS EDV: 162 ml QGS ESV:  38 ml  Impression Exercise Capacity:  Good exercise capacity. BP Response:  Normal blood pressure response. Clinical Symptoms:  Short of breath, fatigued.  ECG Impression:  No significant ST segment change suggestive of ischemia. Comparison with Prior Nuclear Study: No images to compare  Overall Impression:  Low to moderate risk stress test.  There is a small to moderate, partially reversible mild mid to apical anterior perfusion defect.  This likely represents prior  infarction with significant peri-infarct ischemia.  EF is mildly depressed with septal and apical hypokinesis.   LV Ejection Fraction: 46%.  LV Wall Motion:  Septal and apical hypokinesis.   Marca Ancona 07/11/2012

## 2012-07-13 ENCOUNTER — Ambulatory Visit (INDEPENDENT_AMBULATORY_CARE_PROVIDER_SITE_OTHER): Payer: Medicare Other | Admitting: Internal Medicine

## 2012-07-13 ENCOUNTER — Telehealth: Payer: Self-pay | Admitting: Cardiovascular Disease

## 2012-07-13 ENCOUNTER — Encounter (HOSPITAL_COMMUNITY): Payer: Medicare Other

## 2012-07-13 ENCOUNTER — Encounter: Payer: Self-pay | Admitting: Internal Medicine

## 2012-07-13 VITALS — BP 122/60 | HR 59 | Temp 97.8°F | Resp 16 | Wt 217.0 lb

## 2012-07-13 DIAGNOSIS — R509 Fever, unspecified: Secondary | ICD-10-CM | POA: Diagnosis not present

## 2012-07-13 DIAGNOSIS — E785 Hyperlipidemia, unspecified: Secondary | ICD-10-CM | POA: Diagnosis not present

## 2012-07-13 DIAGNOSIS — I10 Essential (primary) hypertension: Secondary | ICD-10-CM

## 2012-07-13 DIAGNOSIS — E119 Type 2 diabetes mellitus without complications: Secondary | ICD-10-CM

## 2012-07-13 DIAGNOSIS — I2510 Atherosclerotic heart disease of native coronary artery without angina pectoris: Secondary | ICD-10-CM

## 2012-07-13 DIAGNOSIS — C9110 Chronic lymphocytic leukemia of B-cell type not having achieved remission: Secondary | ICD-10-CM

## 2012-07-13 DIAGNOSIS — R933 Abnormal findings on diagnostic imaging of other parts of digestive tract: Secondary | ICD-10-CM

## 2012-07-13 NOTE — Progress Notes (Signed)
  Subjective:    Patient ID: Ralph Gray, male    DOB: 03/24/1935, 77 y.o.   MRN: [id number]  HPI Ralph Gray presents for general follow-up after recent hospitalization for fever of unknown origin. He has made a full recovery - feels well and is participating in all his usual activities. He has had full exam and labs as part of his hospitalization.  PMH, FamHx and SocHx reviewed for any changes and relevance.  Meds - unchanged    Review of Systems System review is negative for any constitutional, cardiac, pulmonary, GI or neuro symptoms or complaints other than as described in the HPI.     Objective:   Physical Exam Filed Vitals:   07/13/12 0901  BP: 122/60  Pulse: 59  Temp: 97.8 F (36.6 C)  Resp: 16   Wt Readings from Last 3 Encounters:  07/13/12 217 lb (98.431 kg)  07/11/12 213 lb (96.616 kg)  07/05/12 219 lb (99.338 kg)   Gen'l- modestly overweight white man in no distress HEENT - C&S clear Cor - RRR Pulm - normal respirations Abd - no guarding or rebound MSK - no deformities Neuro -0 A&O x 3       Assessment & Plan:  1. FUO- reviewed hospital labs - all negative cultures. Problem is resolved.

## 2012-07-13 NOTE — Telephone Encounter (Signed)
Patient calling for test results he can be reached at hm or cell.

## 2012-07-13 NOTE — Telephone Encounter (Signed)
Spoke with pt, aware dr cooper back in the office tomorrow and will call him back once reviewed

## 2012-07-14 ENCOUNTER — Other Ambulatory Visit: Payer: Self-pay

## 2012-07-14 DIAGNOSIS — I2510 Atherosclerotic heart disease of native coronary artery without angina pectoris: Secondary | ICD-10-CM

## 2012-07-15 NOTE — Assessment & Plan Note (Signed)
Lab Results  Component Value Date   WBC 15.8* 06/27/2012   HGB 12.6* 06/27/2012   HCT 36.0* 06/27/2012   MCV 89.6 06/27/2012   PLT 123* 06/27/2012   Sees hematologist in [place].He has been stable with no anemia.

## 2012-07-15 NOTE — Assessment & Plan Note (Signed)
BP Readings from Last 3 Encounters:  07/13/12 122/60  07/11/12 137/67  07/05/12 116/62   Good control on present medications. No changes

## 2012-07-15 NOTE — Assessment & Plan Note (Signed)
LDL 3 weeks ago - better than goal of 80 or less  Plan - no change in medication.  Follow-up lab in 6 months

## 2012-07-15 NOTE — Assessment & Plan Note (Signed)
Lab Results  Component Value Date   HGBA1C 6.8* 06/26/2012   Good control on oral medications. Will continue the same.

## 2012-07-15 NOTE — Assessment & Plan Note (Signed)
Stable without chest pain.  Plan - per cardiology

## 2012-07-15 NOTE — Assessment & Plan Note (Signed)
resolved 

## 2012-07-18 ENCOUNTER — Encounter: Payer: Self-pay | Admitting: Gastroenterology

## 2012-07-19 ENCOUNTER — Other Ambulatory Visit (HOSPITAL_COMMUNITY): Payer: Medicare Other

## 2012-07-21 ENCOUNTER — Ambulatory Visit: Payer: Medicare Other | Admitting: Cardiovascular Disease

## 2012-08-01 ENCOUNTER — Other Ambulatory Visit (HOSPITAL_COMMUNITY): Payer: Self-pay | Admitting: Cardiology

## 2012-08-01 ENCOUNTER — Ambulatory Visit (HOSPITAL_COMMUNITY): Payer: Medicare Other | Attending: Cardiology | Admitting: Radiology

## 2012-08-01 DIAGNOSIS — I2510 Atherosclerotic heart disease of native coronary artery without angina pectoris: Secondary | ICD-10-CM | POA: Diagnosis not present

## 2012-08-01 DIAGNOSIS — I10 Essential (primary) hypertension: Secondary | ICD-10-CM | POA: Diagnosis not present

## 2012-08-01 DIAGNOSIS — I4891 Unspecified atrial fibrillation: Secondary | ICD-10-CM | POA: Insufficient documentation

## 2012-08-01 DIAGNOSIS — E669 Obesity, unspecified: Secondary | ICD-10-CM | POA: Insufficient documentation

## 2012-08-01 DIAGNOSIS — E119 Type 2 diabetes mellitus without complications: Secondary | ICD-10-CM | POA: Insufficient documentation

## 2012-08-01 DIAGNOSIS — E785 Hyperlipidemia, unspecified: Secondary | ICD-10-CM | POA: Diagnosis not present

## 2012-08-01 MED ORDER — PERFLUTREN PROTEIN A MICROSPH IV SUSP
2.0000 mL | Freq: Once | INTRAVENOUS | Status: AC
  Administered 2012-08-01: 2 mL via INTRAVENOUS

## 2012-08-01 NOTE — Progress Notes (Signed)
Echocardiogram performed with [employer].  

## 2012-08-03 ENCOUNTER — Ambulatory Visit: Payer: Medicare Other | Admitting: Cardiovascular Disease

## 2012-08-03 DIAGNOSIS — B351 Tinea unguium: Secondary | ICD-10-CM | POA: Diagnosis not present

## 2012-08-03 DIAGNOSIS — L97509 Non-pressure chronic ulcer of other part of unspecified foot with unspecified severity: Secondary | ICD-10-CM | POA: Diagnosis not present

## 2012-08-07 ENCOUNTER — Encounter: Payer: Self-pay | Admitting: Cardiovascular Disease

## 2012-08-07 ENCOUNTER — Ambulatory Visit (INDEPENDENT_AMBULATORY_CARE_PROVIDER_SITE_OTHER): Payer: Medicare Other | Admitting: Cardiovascular Disease

## 2012-08-07 ENCOUNTER — Ambulatory Visit (INDEPENDENT_AMBULATORY_CARE_PROVIDER_SITE_OTHER): Payer: Medicare Other

## 2012-08-07 VITALS — BP 124/60 | HR 63 | Ht 70.0 in | Wt 215.0 lb

## 2012-08-07 DIAGNOSIS — E1169 Type 2 diabetes mellitus with other specified complication: Secondary | ICD-10-CM

## 2012-08-07 DIAGNOSIS — IMO0002 Reserved for concepts with insufficient information to code with codable children: Secondary | ICD-10-CM

## 2012-08-07 DIAGNOSIS — I2510 Atherosclerotic heart disease of native coronary artery without angina pectoris: Secondary | ICD-10-CM | POA: Diagnosis not present

## 2012-08-07 DIAGNOSIS — E11621 Type 2 diabetes mellitus with foot ulcer: Secondary | ICD-10-CM

## 2012-08-07 DIAGNOSIS — L97509 Non-pressure chronic ulcer of other part of unspecified foot with unspecified severity: Secondary | ICD-10-CM

## 2012-08-07 DIAGNOSIS — Z7901 Long term (current) use of anticoagulants: Secondary | ICD-10-CM

## 2012-08-07 LAB — POCT INR: INR: 2.2

## 2012-08-07 NOTE — Progress Notes (Signed)
 HPI:  [name] returns for followup evaluation. He is a 77-year-old gentleman with coronary artery disease status post CABG. He is also followed for paroxysmal atrial fibrillation, hypertension, carotid stenosis, and hyperlipidemia. He has been chronically anticoagulated with warfarin.  At the time of his last visit in August 2013 he was noted to have an age-indeterminate anterolateral infarction pattern on his EKG. This was different from his prior study and a stress Myoview was recommended. This demonstrated a small apical scar and mild anterolateral ischemia. His left ventricular ejection fraction was reduced by Myoview. A followup echocardiogram was done in this showed normal left ventricular function. LVEF by echo was estimated at 55-60%.  The patient has just returned from a [place]. He had a great time and did good bit of walking on his trip. He has not had any exertional chest pain or pressure. He's had pain in the left axillary region, but this actually improves with exercise. He's had no dyspnea, palpitations, orthopnea, or PND. He's had problems with a foot ulcer on the lateral aspect of the left foot. This area healed up several months ago, but has become a recurrent problem after he has been walking more.  Outpatient Encounter Prescriptions as of 08/07/2012  Medication Sig Dispense Refill  . aspirin 81 MG tablet Take 81 mg by mouth daily.        . atorvastatin (LIPITOR) 10 MG tablet Take 1 tablet (10 mg total) by mouth daily.  90 tablet  3  . cefUROXime (CEFTIN) 500 MG tablet Take 1 tablet (500 mg total) by mouth 2 (two) times daily.  14 tablet  0  . felodipine (PLENDIL) 5 MG 24 hr tablet Take 1 tablet (5 mg total) by mouth daily.  90 tablet  1  . fluticasone (VERAMYST) 27.5 MCG/SPRAY nasal spray Place 2 sprays into the nose daily.        . glimepiride (AMARYL) 1 MG tablet Take 1 tablet (1 mg total) by mouth daily before breakfast.  90 tablet  3  . glucose blood  (ONE TOUCH ULTRA TEST) test strip 1 each by Other route daily. And lancets 1/day 250.00  100 each  12  . L-Methylfolate-Algae-B12-B6 (METANX) 3-90.314-2-35 MG CAPS TAKE 1 CAPSULE DAILY  90 capsule  3  . losartan (COZAAR) 50 MG tablet TAKE 1 TABLET TWICE A DAY  180 tablet  0  . metformin (FORTAMET) 1000 MG (OSM) 24 hr tablet Take 2,000 mg by mouth daily with breakfast.      . metoprolol succinate (TOPROL-XL) 25 MG 24 hr tablet Take 0.5 tablets (12.5 mg total) by mouth daily. Take 1/2 tablet daily  45 tablet  1  . montelukast (SINGULAIR) 10 MG tablet Take 1 tablet (10 mg total) by mouth at bedtime.  90 tablet  1  . pregabalin (LYRICA) 75 MG capsule Take 1 capsule (75 mg total) by mouth 2 (two) times daily.  180 capsule  3  . sitaGLIPtin (JANUVIA) 100 MG tablet Take 1 tablet (100 mg total) by mouth daily.  90 tablet  3  . Tamsulosin HCl (FLOMAX) 0.4 MG CAPS Take 0.8 mg by mouth daily.      . warfarin (COUMADIN) 6 MG tablet Take 6-9 mg by mouth daily. Takes 9 mg on Monday, Wednesday, Friday and Saturday and 6 mg all other days        Allergies  Allergen Reactions  . Amoxicillin Anaphylaxis  . Other Other (See Comments)    Muscle Relaxers; "I   get very woozy"    Past Medical History  Diagnosis Date  . Hypertension   . Hyperlipidemia   . Coronary artery disease     MI '99  . Peripheral neuropathy   . Macular degeneration     has had intra-orbital injections. Has had laser treatment  . Prostate cancer     XRT 45 tx  . CLL (chronic lymphoblastic leukemia)   . Atrial fibrillation 2006    started 2006  . Type II diabetes mellitus   . Hepatitis C infection 1990's    ROS: Negative except as per HPI  There were no vitals taken for this visit.  PHYSICAL EXAM: Pt is alert and oriented, NAD HEENT: normal Neck: JVP - normal, carotids 2+= without bruits Lungs: CTA bilaterally CV: RRR without murmur or gallop Abd: soft, NT, Positive BS, no hepatomegaly Ext: no C/C/E, distal pulses intact  and equal Skin: warm/dry no rash  2D Echo: 08/01/2012: Left ventricle: The cavity size was mildly dilated. Wall thickness was normal. Systolic function was normal. The estimated ejection fraction was in the range of 55% to 60%. Wall motion was normal; there were no regional wall motion abnormalities. Doppler parameters are consistent with abnormal left ventricular relaxation (grade 1 diastolic dysfunction).  ------------------------------------------------------------ Aortic valve: Trileaflet; mildly thickened, mildly calcified leaflets. Sclerosis without stenosis. Doppler: No regurgitation.  ------------------------------------------------------------ Aorta: Aortic root: The aortic root was normal in size. Ascending aorta: The ascending aorta was normal in size.  ------------------------------------------------------------ Mitral valve: Mildly thickened leaflets . Doppler: No significant regurgitation. Peak gradient: 2mm Hg (D).  ------------------------------------------------------------ Left atrium: The atrium was moderately dilated.  ------------------------------------------------------------ Right ventricle: The cavity size was normal. Wall thickness was normal. Systolic function was normal.  ------------------------------------------------------------ Pulmonic valve: Structurally normal valve. Cusp separation was normal. Doppler: Transvalvular velocity was within the normal range. Trivial regurgitation.  ------------------------------------------------------------ Tricuspid valve: Structurally normal valve. Leaflet separation was normal. Doppler: Transvalvular velocity was within the normal range. Mild regurgitation.  ------------------------------------------------------------ Right atrium: The atrium was normal in size.  ------------------------------------------------------------ Pericardium: There was no pericardial  effusion.  ------------------------------------------------------------  Myoview Stress: QPS  Raw Data Images: Normal; no motion artifact; normal heart/lung ratio.  Stress Images: Small to moderate-sized, mild mid to apical anterior perfusion defect.  Rest Images: Small, mild apical anterior perfusion defect.  Subtraction (SDS): Small to moderate mid to apical anterior mild perfusion defect noted on stress that is partially reversible.  Transient Ischemic Dilatation (Normal <1.22): 1.08  Lung/Heart Ratio (Normal <0.45): 0.45  Quantitative Gated Spect Images  QGS EDV: 162 ml  QGS ESV: 38 ml  Impression  Exercise Capacity: Good exercise capacity.  BP Response: Normal blood pressure response.  Clinical Symptoms: Short of breath, fatigued.  ECG Impression: No significant ST segment change suggestive of ischemia.  Comparison with Prior Nuclear Study: No images to compare  Overall Impression: Low to moderate risk stress test. There is a small to moderate, partially reversible mild mid to apical anterior perfusion defect. This likely represents prior infarction with significant peri-infarct ischemia. EF is mildly depressed with septal and apical hypokinesis.  LV Ejection Fraction: 46%. LV Wall Motion: Septal and apical hypokinesis.  Dalton McLean  07/11/2012   ASSESSMENT AND PLAN: 1. Coronary artery disease, native vessel. The patient is status post CABG now 14 years out from revascularization surgery. His EKG and stress Myoview suggest possible anteroapical infarction with significant peri-infarct ischemia. However, it is interesting that his 2-D echocardiogram shows normal left ventricular function. I discussed the discrepant data   with the patient and his wife. We discussed the risks, potential benefits, and alternatives to a definitive evaluation with cardiac catheterization. I think it would be reasonable to proceed with cardiac catheterization considering this patient's diabetes, abnormal EKG,  abnormal nuclear scan, and risk for silent ischemia. I discussed the patient's case in detail with his longtime cardiologist in [place], Dr. Weitz. He agrees with cardiac cath for definitive evaluation and I conveyed this to the patient in a telephone call this evening. The patient is in agreement to cath and possible PCI and this will be scheduled.  2. Essential hypertension, well controlled. He will continue his current medical program.  3. Paroxysmal atrial fibrillation. He remains in sinus rhythm. He is anticoagulated with warfarin. If he decides to have cardiac catheterization, we will hold his warfarin prior to the procedure. He has no history of stroke or TIA and I do not think bridging therapy is indicated.  Jennika Ringgold 08/07/2012 10:55 AM      

## 2012-08-07 NOTE — Patient Instructions (Addendum)
You have been referred to the [facility].  Your physician recommends that you continue on your current medications as directed. Please refer to the Current Medication list given to you today.  We will call you once we speak with your previous Cardiologist.

## 2012-08-09 ENCOUNTER — Telehealth: Payer: Self-pay | Admitting: Cardiovascular Disease

## 2012-08-09 DIAGNOSIS — I2510 Atherosclerotic heart disease of native coronary artery without angina pectoris: Secondary | ICD-10-CM

## 2012-08-09 NOTE — Telephone Encounter (Signed)
Pt would like to use 11/19 but he was suppose to go away for a long weekend and if it is a long re cooperation period he may have to reschedule after that

## 2012-08-09 NOTE — Telephone Encounter (Signed)
Tried to call back, no answer.  

## 2012-08-10 DIAGNOSIS — L02619 Cutaneous abscess of unspecified foot: Secondary | ICD-10-CM | POA: Diagnosis not present

## 2012-08-10 DIAGNOSIS — L03119 Cellulitis of unspecified part of limb: Secondary | ICD-10-CM | POA: Diagnosis not present

## 2012-08-11 NOTE — Telephone Encounter (Signed)
I spoke with the pt and he would like to proceed with cardiac cath on 08/29/12.

## 2012-08-15 NOTE — Telephone Encounter (Signed)
Pt aware of cardiac cath instructions by phone.  I will also mail a copy of instructions to the pt's home.  The pt will have pre-procedure labs drawn on 08/28/12.

## 2012-08-16 ENCOUNTER — Other Ambulatory Visit: Payer: Self-pay | Admitting: Internal Medicine

## 2012-08-18 ENCOUNTER — Other Ambulatory Visit: Payer: Self-pay | Admitting: Cardiology

## 2012-08-18 ENCOUNTER — Telehealth: Payer: Self-pay | Admitting: Cardiovascular Disease

## 2012-08-18 ENCOUNTER — Encounter (HOSPITAL_BASED_OUTPATIENT_CLINIC_OR_DEPARTMENT_OTHER): Payer: Medicare Other | Attending: General Surgery

## 2012-08-18 DIAGNOSIS — E1169 Type 2 diabetes mellitus with other specified complication: Secondary | ICD-10-CM | POA: Insufficient documentation

## 2012-08-18 DIAGNOSIS — I2510 Atherosclerotic heart disease of native coronary artery without angina pectoris: Secondary | ICD-10-CM | POA: Diagnosis not present

## 2012-08-18 DIAGNOSIS — Z7901 Long term (current) use of anticoagulants: Secondary | ICD-10-CM | POA: Diagnosis not present

## 2012-08-18 DIAGNOSIS — M79609 Pain in unspecified limb: Secondary | ICD-10-CM

## 2012-08-18 DIAGNOSIS — Z79899 Other long term (current) drug therapy: Secondary | ICD-10-CM | POA: Insufficient documentation

## 2012-08-18 DIAGNOSIS — Z951 Presence of aortocoronary bypass graft: Secondary | ICD-10-CM | POA: Insufficient documentation

## 2012-08-18 DIAGNOSIS — E785 Hyperlipidemia, unspecified: Secondary | ICD-10-CM | POA: Insufficient documentation

## 2012-08-18 DIAGNOSIS — I252 Old myocardial infarction: Secondary | ICD-10-CM | POA: Insufficient documentation

## 2012-08-18 DIAGNOSIS — L97509 Non-pressure chronic ulcer of other part of unspecified foot with unspecified severity: Secondary | ICD-10-CM | POA: Insufficient documentation

## 2012-08-18 DIAGNOSIS — L8990 Pressure ulcer of unspecified site, unspecified stage: Secondary | ICD-10-CM

## 2012-08-18 DIAGNOSIS — G589 Mononeuropathy, unspecified: Secondary | ICD-10-CM | POA: Insufficient documentation

## 2012-08-18 DIAGNOSIS — I10 Essential (primary) hypertension: Secondary | ICD-10-CM | POA: Diagnosis not present

## 2012-08-18 DIAGNOSIS — Z8546 Personal history of malignant neoplasm of prostate: Secondary | ICD-10-CM | POA: Diagnosis not present

## 2012-08-18 NOTE — Telephone Encounter (Signed)
New Problem:    Patient called in because he still has not received his letter and he went to the wound center and would not be able to have his procedure.  Please call back.  Patient is aware that you were not in the office when this message was taken.

## 2012-08-18 NOTE — H&P (Cosign Needed)
NAME:  Ralph Gray, Ralph Gray NO.:  [id number]  MEDICAL RECORD NO.:  [id number]  LOCATION:  FOOT                         FACILITY:  [facility]  PHYSICIAN:  Joanne Gavel, M.D.        DATE OF BIRTH:  [birth year]  DATE OF ADMISSION:  08/18/2012 DATE OF DISCHARGE:                             HISTORY & PHYSICAL   CHIEF COMPLAINT:  Wound, left foot.  HISTORY OF PRESENT ILLNESS:  This patient is a 10 plus year diabetic. He developed callus on the lateral aspect of the plantar surface of the left foot.  After debridement, this turned out to be an ulcer, and the podiatrist has been treating this for several weeks with offloading and Iodoform gel.  Several weeks ago, he had an episode of high fever of unknown cause.  He was admitted to the hospital for 2 days, treated with antibiotics and at that time, he did have an MRI of the foot, which revealed no osteomyelitis.  PAST MEDICAL HISTORY:  Significant for hypertension, carotid stenosis, hyperlipidemia, coronary artery disease with atrial fibrillation, marked peripheral neuropathy, macular degeneration, prostate cancer treated with radiation, diabetes, hepatitis C by history, myocardial infarction in 1999.  PAST SURGICAL HISTORY:  He had a 4-vessel coronary bypass graft in 1999.  Cigarettes, none for 45 years.  Alcohol, rarely.  ALLERGIES:  AMOXICILLIN.  MEDICATIONS:  Atorvastatin, cephalexin, __________, glimepiride, Cozaar, metformin, metoprolol, Singulair, Lyrica, Flomax, warfarin, sitagliptin.  REVIEW OF SYSTEMS:  As above.  PHYSICAL EXAMINATION:  VITAL SIGNS:  Temperature 97.7, pulse 60 and regular at this time, respirations 16, blood pressure 129/73.  Glucose is 172.  Weight is stated to be 215. CHEST:  Clear. HEART:  Regular rhythm. EXTREMITIES:  Examination of the foot, left side reveals a 0.5 x 0.7 x 0.3 with a very minor callus formation.  Both feet are profoundly neuropathic.  ABI on the left foot is 1.21.  He does  have a good dorsalis pedis pulse.  IMPRESSION:  Diabetic foot ulcer.  The patient with profound neuropathy. The wound can be probed almost to the bone.  PLAN OF TREATMENT:  We will require offloading, probably with cast and if no improvement, he will probably be a good candidate for hyperbaric oxygen treatment.  We will see him in 7 days.     Joanne Gavel, M.D.     RA/MEDQ  D:  08/18/2012  T:  08/18/2012  Job:  [id number]  cc:   Rosalyn Gess. Norins, MD

## 2012-08-21 ENCOUNTER — Other Ambulatory Visit (HOSPITAL_BASED_OUTPATIENT_CLINIC_OR_DEPARTMENT_OTHER): Payer: Self-pay | Admitting: General Surgery

## 2012-08-21 ENCOUNTER — Encounter (INDEPENDENT_AMBULATORY_CARE_PROVIDER_SITE_OTHER): Payer: Medicare Other

## 2012-08-21 ENCOUNTER — Ambulatory Visit (HOSPITAL_COMMUNITY)
Admission: RE | Admit: 2012-08-21 | Discharge: 2012-08-21 | Disposition: A | Discharge status: Home / Self Care | Payer: Medicare Other | Source: Ambulatory Visit | Attending: General Surgery | Admitting: General Surgery

## 2012-08-21 DIAGNOSIS — M7989 Other specified soft tissue disorders: Secondary | ICD-10-CM | POA: Insufficient documentation

## 2012-08-21 DIAGNOSIS — L8990 Pressure ulcer of unspecified site, unspecified stage: Secondary | ICD-10-CM

## 2012-08-21 DIAGNOSIS — L98499 Non-pressure chronic ulcer of skin of other sites with unspecified severity: Secondary | ICD-10-CM

## 2012-08-21 DIAGNOSIS — I739 Peripheral vascular disease, unspecified: Secondary | ICD-10-CM | POA: Diagnosis not present

## 2012-08-21 DIAGNOSIS — L97509 Non-pressure chronic ulcer of other part of unspecified foot with unspecified severity: Secondary | ICD-10-CM | POA: Insufficient documentation

## 2012-08-21 DIAGNOSIS — I4891 Unspecified atrial fibrillation: Secondary | ICD-10-CM | POA: Diagnosis not present

## 2012-08-21 DIAGNOSIS — E1169 Type 2 diabetes mellitus with other specified complication: Secondary | ICD-10-CM | POA: Insufficient documentation

## 2012-08-21 DIAGNOSIS — M869 Osteomyelitis, unspecified: Secondary | ICD-10-CM

## 2012-08-21 DIAGNOSIS — Z7901 Long term (current) use of anticoagulants: Secondary | ICD-10-CM | POA: Insufficient documentation

## 2012-08-21 DIAGNOSIS — E785 Hyperlipidemia, unspecified: Secondary | ICD-10-CM | POA: Diagnosis not present

## 2012-08-21 DIAGNOSIS — I2510 Atherosclerotic heart disease of native coronary artery without angina pectoris: Secondary | ICD-10-CM | POA: Diagnosis not present

## 2012-08-21 DIAGNOSIS — G589 Mononeuropathy, unspecified: Secondary | ICD-10-CM | POA: Diagnosis not present

## 2012-08-21 DIAGNOSIS — S99919A Unspecified injury of unspecified ankle, initial encounter: Secondary | ICD-10-CM | POA: Diagnosis not present

## 2012-08-21 DIAGNOSIS — S8990XA Unspecified injury of unspecified lower leg, initial encounter: Secondary | ICD-10-CM | POA: Diagnosis not present

## 2012-08-21 DIAGNOSIS — S91309A Unspecified open wound, unspecified foot, initial encounter: Secondary | ICD-10-CM | POA: Diagnosis not present

## 2012-08-21 DIAGNOSIS — I10 Essential (primary) hypertension: Secondary | ICD-10-CM | POA: Insufficient documentation

## 2012-08-21 NOTE — Telephone Encounter (Signed)
I spoke with the pt while he was in the office for his arterial study. I reviewed his cardiac cath instructions with him.  The pt just wants to make sure that he is okay to have procedure performed while having an infection in his foot.  I made the pt aware that he is okay to proceed with cardiac cath.

## 2012-08-22 DIAGNOSIS — H353290 Exudative age-related macular degeneration, unspecified eye, stage unspecified: Secondary | ICD-10-CM | POA: Diagnosis not present

## 2012-08-22 DIAGNOSIS — H43819 Vitreous degeneration, unspecified eye: Secondary | ICD-10-CM | POA: Diagnosis not present

## 2012-08-22 DIAGNOSIS — H35349 Macular cyst, hole, or pseudohole, unspecified eye: Secondary | ICD-10-CM | POA: Diagnosis not present

## 2012-08-22 DIAGNOSIS — H43829 Vitreomacular adhesion, unspecified eye: Secondary | ICD-10-CM | POA: Diagnosis not present

## 2012-08-23 ENCOUNTER — Other Ambulatory Visit (HOSPITAL_BASED_OUTPATIENT_CLINIC_OR_DEPARTMENT_OTHER): Payer: Self-pay | Admitting: General Surgery

## 2012-08-23 ENCOUNTER — Encounter (INDEPENDENT_AMBULATORY_CARE_PROVIDER_SITE_OTHER): Payer: Medicare Other

## 2012-08-23 ENCOUNTER — Ambulatory Visit (HOSPITAL_COMMUNITY)
Admission: RE | Admit: 2012-08-23 | Discharge: 2012-08-23 | Disposition: A | Discharge status: Home / Self Care | Payer: Medicare Other | Source: Ambulatory Visit | Attending: General Surgery | Admitting: General Surgery

## 2012-08-23 ENCOUNTER — Ambulatory Visit (INDEPENDENT_AMBULATORY_CARE_PROVIDER_SITE_OTHER): Payer: Medicare Other | Admitting: Gastroenterology

## 2012-08-23 ENCOUNTER — Encounter: Payer: Self-pay | Admitting: Gastroenterology

## 2012-08-23 VITALS — BP 114/60 | HR 60 | Ht 70.0 in | Wt 215.0 lb

## 2012-08-23 DIAGNOSIS — M79609 Pain in unspecified limb: Secondary | ICD-10-CM

## 2012-08-23 DIAGNOSIS — T148XXA Other injury of unspecified body region, initial encounter: Secondary | ICD-10-CM

## 2012-08-23 DIAGNOSIS — R609 Edema, unspecified: Secondary | ICD-10-CM

## 2012-08-23 DIAGNOSIS — L97509 Non-pressure chronic ulcer of other part of unspecified foot with unspecified severity: Secondary | ICD-10-CM | POA: Diagnosis not present

## 2012-08-23 DIAGNOSIS — X58XXXA Exposure to other specified factors, initial encounter: Secondary | ICD-10-CM | POA: Insufficient documentation

## 2012-08-23 DIAGNOSIS — E1169 Type 2 diabetes mellitus with other specified complication: Secondary | ICD-10-CM | POA: Diagnosis not present

## 2012-08-23 DIAGNOSIS — R933 Abnormal findings on diagnostic imaging of other parts of digestive tract: Secondary | ICD-10-CM | POA: Diagnosis not present

## 2012-08-23 DIAGNOSIS — I10 Essential (primary) hypertension: Secondary | ICD-10-CM | POA: Diagnosis not present

## 2012-08-23 DIAGNOSIS — I2510 Atherosclerotic heart disease of native coronary artery without angina pectoris: Secondary | ICD-10-CM | POA: Diagnosis not present

## 2012-08-23 DIAGNOSIS — S21109A Unspecified open wound of unspecified front wall of thorax without penetration into thoracic cavity, initial encounter: Secondary | ICD-10-CM | POA: Diagnosis not present

## 2012-08-23 DIAGNOSIS — E785 Hyperlipidemia, unspecified: Secondary | ICD-10-CM | POA: Diagnosis not present

## 2012-08-23 DIAGNOSIS — G589 Mononeuropathy, unspecified: Secondary | ICD-10-CM | POA: Diagnosis not present

## 2012-08-23 NOTE — Progress Notes (Signed)
HPI: This is a    very pleasant 77 year old man whom I am meeting for the first time today.   Had colonoscopy 16 months ago in [facility].   A small polyp was removed from the colon it was not adenomatous. The indication for the procedure was "history of adenomatous polyps" however I have no pathology reports documenting as such. The [facility] gastroenterologist recommend that he have a repeat colonoscopy in one to 2 years due to inadequate prep especially in the right side of the colon.  He has had at least 3 colonoscopies over the past many years.   He is on coumadin, due to afib.  HE is having left heart cath  No colon cancer in family.  No overt bleeding,  No changes in bowels.   Review of systems: Pertinent positive and negative review of systems were noted in the above HPI section. Complete review of systems was performed and was otherwise normal.    Past Medical History  Diagnosis Date  . Hypertension   . Hyperlipidemia   . Coronary artery disease     MI '99  . Peripheral neuropathy   . Macular degeneration     has had intra-orbital injections. Has had laser treatment  . Prostate cancer     XRT 47 tx  . CLL (chronic lymphoblastic leukemia)   . Atrial fibrillation 2006    started 2006  . Type II diabetes mellitus   . Hepatitis C infection 1990's    Past Surgical History  Procedure Date  . Cardioversion   . Coronary artery bypass graft 1999    CABG X4; [place] [facility].     Current Outpatient Prescriptions  Medication Sig Dispense Refill  . aspirin 81 MG tablet Take 81 mg by mouth daily.        Marland Kitchen atorvastatin (LIPITOR) 10 MG tablet Take 1 tablet (10 mg total) by mouth daily.  90 tablet  3  . doxycycline (DORYX) 100 MG DR capsule Take 100 mg by mouth 2 (two) times daily.      . felodipine (PLENDIL) 5 MG 24 hr tablet Take 1 tablet (5 mg total) by mouth daily.  90 tablet  1  . fluticasone (VERAMYST) 27.5 MCG/SPRAY  nasal spray Place 2 sprays into the nose daily.        Marland Kitchen glimepiride (AMARYL) 1 MG tablet Take 1 tablet (1 mg total) by mouth daily before breakfast.  90 tablet  3  . glucose blood (ONE TOUCH ULTRA TEST) test strip 1 each by Other route daily. And lancets 1/day 250.00  100 each  12  . L-Methylfolate-Algae-B12-B6 (METANX) 3-90.314-2-35 MG CAPS TAKE 1 CAPSULE DAILY  90 capsule  3  . losartan (COZAAR) 50 MG tablet TAKE 1 TABLET TWICE A DAY  180 tablet  0  . metformin (FORTAMET) 1000 MG (OSM) 24 hr tablet Take 2,000 mg by mouth daily with breakfast.      . metoprolol succinate (TOPROL-XL) 25 MG 24 hr tablet Take 0.5 tablets (12.5 mg total) by mouth daily. Take 1/2 tablet daily  45 tablet  1  . montelukast (SINGULAIR) 10 MG tablet Take 1 tablet (10 mg total) by mouth at bedtime.  90 tablet  1  . pregabalin (LYRICA) 75 MG capsule Take 1 capsule (75 mg total) by mouth 2 (two) times daily.  180 capsule  3  . sitaGLIPtin (JANUVIA) 100 MG tablet Take 1 tablet (100 mg total) by mouth daily.  90 tablet  3  . Tamsulosin HCl (FLOMAX) 0.4 MG CAPS TAKE 1 CAPSULE DAILY  90 capsule  1  . warfarin (COUMADIN) 6 MG tablet Take 6-9 mg by mouth daily. Takes 9 mg on Monday, Wednesday, Friday and Saturday and 6 mg all other days      . [DISCONTINUED] multivitamin (METANX) 3-35-2 MG TABS tablet Take 1 tablet by mouth daily.  90 tablet  1    Allergies as of 08/23/2012 - Review Complete 08/23/2012  Allergen Reaction Noted  . Amoxicillin Anaphylaxis 06/27/2012  . Other Other (See Comments) 06/27/2012    Family History  Problem Relation Age of Onset  . Heart disease Mother 20    died  . Breast cancer Mother     sarcoma  . Heart attack Father 40    died  . Heart disease Father     MI  . Diabetes Neg Hx   . COPD Neg Hx     History   Social History  . Marital Status: Married    Spouse Name: N/A    Number of Children: N/A  . Years of Education: N/A   Occupational History  . Retired    Social History Main  Topics  . Smoking status: Former Smoker -- 2.5 packs/day for 10 years    Types: Cigarettes    Quit date: 10/14/1963  . Smokeless tobacco: Never Used  . Alcohol Use: Yes     Comment: wine rarely  . Drug Use: No  . Sexually Active: Yes -- Male partner(s)   Other Topics Concern  . Not on file   Social History Narrative   HSG, [name], EDD - doctorate in education. Married '64 - 79yr/divorced ( severe bipolar disease); Married '07. 2 sons - '67, '66; 1 dtr - '70. 7 grandchildren. Exercise - 6 days a week: cardio and strengthening. 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 ACP- yes for CPR; no -long term ventilation; no heroic or futile measures.        Physical Exam: BP 114/60  Pulse 60  Ht 5\' 10"  (1.778 m)  Wt 215 lb (97.523 kg)  BMI 30.85 kg/m2 Constitutional: generally well-appearing Psychiatric: alert and oriented x3 Eyes: extraocular movements intact Mouth: oral pharynx moist, no lesions Neck: supple no lymphadenopathy Cardiovascular: heart regular rate and rhythm Lungs: clear to auscultation bilaterally Abdomen: soft, nontender, nondistended, no obvious ascites, no peritoneal signs, normal bowel sounds Extremities: no lower extremity edema bilaterally Skin: no lesions on visible extremities    Assessment and plan: 77 y.o. male with  possible history of adenomatous colon polyps  Electronic track down whether he truly had adenomatous colon polyps or not. He had a colonoscopy a year and a half ago with less than perfect prep and was recommended to have a repeat colonoscopy in one to 2 years. Currently he is on Coumadin for atrial fibrillation and he is undergoing an angiogram of his heart next week for ischemia noted on recent stress testing. I think it is probably safe to delay his colonoscopy for at least another year or possibly 2. He has no family history of colon cancer and has had no changes in his bowels. He will  return to see me in one year we will revisit this question.

## 2012-08-23 NOTE — Patient Instructions (Addendum)
Try to remember where you had your previous colonoscopies (we already have your [facility] from 2012), who did them. We will try to get those records sent for review. Return to see Dr. Christella Hartigan in 1 year to revisit this questions (repeat colonoscopy for surveillance). Good luck on your angiogram next week.

## 2012-08-25 ENCOUNTER — Telehealth: Payer: Self-pay | Admitting: Cardiovascular Disease

## 2012-08-25 NOTE — Telephone Encounter (Signed)
plz return call to pt [phone] regarding upcoming procedure on Tuesday and lab orders

## 2012-08-25 NOTE — Telephone Encounter (Signed)
Spoke with pt, questions regarding labs and cath answered.

## 2012-08-28 ENCOUNTER — Encounter: Payer: Self-pay | Admitting: Endocrinology

## 2012-08-28 ENCOUNTER — Other Ambulatory Visit (INDEPENDENT_AMBULATORY_CARE_PROVIDER_SITE_OTHER): Payer: Medicare Other

## 2012-08-28 ENCOUNTER — Ambulatory Visit (INDEPENDENT_AMBULATORY_CARE_PROVIDER_SITE_OTHER): Payer: Medicare Other | Admitting: Endocrinology

## 2012-08-28 ENCOUNTER — Other Ambulatory Visit: Payer: Self-pay | Admitting: Cardiovascular Disease

## 2012-08-28 VITALS — BP 122/80 | HR 63 | Temp 98.0°F | Wt 215.0 lb

## 2012-08-28 DIAGNOSIS — E119 Type 2 diabetes mellitus without complications: Secondary | ICD-10-CM | POA: Diagnosis not present

## 2012-08-28 DIAGNOSIS — I2510 Atherosclerotic heart disease of native coronary artery without angina pectoris: Secondary | ICD-10-CM | POA: Diagnosis not present

## 2012-08-28 LAB — CBC WITH DIFFERENTIAL/PLATELET
Eosinophils Relative: 1 % (ref 0.0–5.0)
Lymphocytes Relative: 43.1 % (ref 12.0–46.0)
MCV: 91.7 fl (ref 78.0–100.0)
Monocytes Absolute: 0.9 10*3/uL (ref 0.1–1.0)
Monocytes Relative: 8.2 % (ref 3.0–12.0)
Neutrophils Relative %: 47.3 % (ref 43.0–77.0)
Platelets: 165 10*3/uL (ref 150.0–400.0)
WBC: 10.7 10*3/uL — ABNORMAL HIGH (ref 4.5–10.5)

## 2012-08-28 LAB — BASIC METABOLIC PANEL
BUN: 28 mg/dL — ABNORMAL HIGH (ref 6–23)
Calcium: 9.3 mg/dL (ref 8.4–10.5)
Creatinine, Ser: 1.4 mg/dL (ref 0.4–1.5)
GFR: 51.62 mL/min — ABNORMAL LOW (ref >60.00)

## 2012-08-28 LAB — PROTIME-INR
INR: 1.3 ratio — ABNORMAL HIGH (ref 0.8–1.0)
Prothrombin Time: 13.4 s — ABNORMAL HIGH (ref 10.2–12.4)

## 2012-08-28 NOTE — Progress Notes (Signed)
Subjective:    Patient ID: Ralph Gray, male    DOB: [birth year], 77 y.o.   MRN: [id number]  HPI pt returns for f/u of type 2 DM (dx'ed 2010; complicated by peripheral sensory neuropathy, foot ulcer, CAD and PAD; he has never been on insulin).  no cbg record, but states cbg's are in the mid-100's.   Past Medical History  Diagnosis Date  . Hypertension   . Hyperlipidemia   . Coronary artery disease     MI '99  . Peripheral neuropathy   . Macular degeneration     has had intra-orbital injections. Has had laser treatment  . Prostate cancer     XRT 47 tx  . CLL (chronic lymphoblastic leukemia)   . Atrial fibrillation 2006    started 2006  . Type II diabetes mellitus   . Hepatitis C infection 1990's    Past Surgical History  Procedure Date  . Cardioversion   . Coronary artery bypass graft 1999    CABG X4; [place] [facility].     History   Social History  . Marital Status: Married    Spouse Name: N/A    Number of Children: N/A  . Years of Education: N/A   Occupational History  . Retired    Social History Main Topics  . Smoking status: Former Smoker -- 2.5 packs/day for 10 years    Types: Cigarettes    Quit date: 10/14/1963  . Smokeless tobacco: Never Used  . Alcohol Use: Yes     Comment: wine rarely  . Drug Use: No  . Sexually Active: Yes -- Male partner(s)   Other Topics Concern  . Not on file   Social History Narrative   HSG, [name], EDD - doctorate in education. Married '64 - 24yr/divorced ( severe bipolar disease); Married '07. 2 sons - '67, '66; 1 dtr - '70. 7 grandchildren. Exercise - 6 days a week: cardio and strengthening. 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 ACP- yes for CPR; no -long term ventilation; no heroic or futile measures.     Current Outpatient Prescriptions on File Prior to Visit  Medication Sig Dispense Refill  . aspirin 81 MG  tablet Take 81 mg by mouth daily.        Marland Kitchen atorvastatin (LIPITOR) 10 MG tablet Take 1 tablet (10 mg total) by mouth daily.  90 tablet  3  . doxycycline (DORYX) 100 MG DR capsule Take 100 mg by mouth 2 (two) times daily.      . felodipine (PLENDIL) 5 MG 24 hr tablet Take 1 tablet (5 mg total) by mouth daily.  90 tablet  1  . fluticasone (VERAMYST) 27.5 MCG/SPRAY nasal spray Place 2 sprays into the nose daily.        Marland Kitchen glucose blood (ONE TOUCH ULTRA TEST) test strip 1 each by Other route daily. And lancets 1/day 250.00  100 each  12  . L-Methylfolate-Algae-B12-B6 (METANX) 3-90.314-2-35 MG CAPS TAKE 1 CAPSULE DAILY  90 capsule  3  . losartan (COZAAR) 50 MG tablet TAKE 1 TABLET TWICE A DAY  180 tablet  0  . metformin (FORTAMET) 1000 MG (OSM) 24 hr tablet Take 2,000 mg by mouth daily with breakfast.      . metoprolol succinate (TOPROL-XL) 25 MG 24 hr tablet Take 0.5 tablets (12.5 mg total) by mouth daily. Take 1/2 tablet daily  45 tablet  1  . montelukast (SINGULAIR)  10 MG tablet Take 1 tablet (10 mg total) by mouth at bedtime.  90 tablet  1  . pregabalin (LYRICA) 75 MG capsule Take 1 capsule (75 mg total) by mouth 2 (two) times daily.  180 capsule  3  . sitaGLIPtin (JANUVIA) 100 MG tablet Take 1 tablet (100 mg total) by mouth daily.  90 tablet  3  . Tamsulosin HCl (FLOMAX) 0.4 MG CAPS TAKE 1 CAPSULE DAILY  90 capsule  1  . warfarin (COUMADIN) 6 MG tablet Take 6-9 mg by mouth daily. Takes 9 mg on Monday, Wednesday, Friday and Saturday and 6 mg all other days      . [DISCONTINUED] multivitamin (METANX) 3-35-2 MG TABS tablet Take 1 tablet by mouth daily.  90 tablet  1    Allergies  Allergen Reactions  . Amoxicillin Anaphylaxis  . Other Other (See Comments)    Muscle Relaxers; "I get very woozy"    Family History  Problem Relation Age of Onset  . Heart disease Mother 64    died  . Breast cancer Mother     sarcoma  . Heart attack Father 40    died  . Heart disease Father     MI  . Diabetes  Neg Hx   . COPD Neg Hx     BP 122/80  Pulse 63  Temp 98 F (36.7 C) (Oral)  Wt 215 lb (97.523 kg)  SpO2 95%   Review of Systems denies hypoglycemia    Objective:   Physical Exam VITAL SIGNS:  See vs page GENERAL: no distress Ext: 1+ bilat leg edema  Lab Results  Component Value Date   HGBA1C 6.8* 06/26/2012      Assessment & Plan:  DM, overcontrolled, for this sulfonylurea-containing regimen

## 2012-08-28 NOTE — Patient Instructions (Addendum)
check your blood sugar once a day.  vary the time of day when you check, between before the 3 meals, and at bedtime.  also check if you have symptoms of your blood sugar being too high or too low.  please keep a record of the readings and bring it to your next appointment here.  please call us sooner if your blood sugar goes below 70, or if you have a lot of readings over 200.   For now, please reduce the glimepiride to 1/2 mg daily.    Please come back for a follow-up appointment in early January.

## 2012-08-29 ENCOUNTER — Encounter (HOSPITAL_BASED_OUTPATIENT_CLINIC_OR_DEPARTMENT_OTHER)
Admission: RE | Disposition: A | Discharge status: Home / Self Care | Payer: Self-pay | Source: Ambulatory Visit | Attending: Cardiovascular Disease

## 2012-08-29 ENCOUNTER — Inpatient Hospital Stay (HOSPITAL_BASED_OUTPATIENT_CLINIC_OR_DEPARTMENT_OTHER)
Admission: RE | Admit: 2012-08-29 | Discharge: 2012-08-29 | Disposition: A | Discharge status: Home / Self Care | Payer: Medicare Other | Source: Ambulatory Visit | Attending: Cardiovascular Disease | Admitting: Cardiovascular Disease

## 2012-08-29 DIAGNOSIS — I6529 Occlusion and stenosis of unspecified carotid artery: Secondary | ICD-10-CM | POA: Insufficient documentation

## 2012-08-29 DIAGNOSIS — R9439 Abnormal result of other cardiovascular function study: Secondary | ICD-10-CM | POA: Diagnosis not present

## 2012-08-29 DIAGNOSIS — Z7901 Long term (current) use of anticoagulants: Secondary | ICD-10-CM | POA: Insufficient documentation

## 2012-08-29 DIAGNOSIS — I10 Essential (primary) hypertension: Secondary | ICD-10-CM | POA: Insufficient documentation

## 2012-08-29 DIAGNOSIS — I25810 Atherosclerosis of coronary artery bypass graft(s) without angina pectoris: Secondary | ICD-10-CM | POA: Insufficient documentation

## 2012-08-29 DIAGNOSIS — I209 Angina pectoris, unspecified: Secondary | ICD-10-CM | POA: Diagnosis not present

## 2012-08-29 DIAGNOSIS — E785 Hyperlipidemia, unspecified: Secondary | ICD-10-CM | POA: Insufficient documentation

## 2012-08-29 DIAGNOSIS — I2510 Atherosclerotic heart disease of native coronary artery without angina pectoris: Secondary | ICD-10-CM | POA: Diagnosis not present

## 2012-08-29 DIAGNOSIS — Z79899 Other long term (current) drug therapy: Secondary | ICD-10-CM | POA: Insufficient documentation

## 2012-08-29 DIAGNOSIS — C9110 Chronic lymphocytic leukemia of B-cell type not having achieved remission: Secondary | ICD-10-CM | POA: Insufficient documentation

## 2012-08-29 DIAGNOSIS — I4891 Unspecified atrial fibrillation: Secondary | ICD-10-CM | POA: Insufficient documentation

## 2012-08-29 SURGERY — JV LEFT HEART CATHETERIZATION WITH CORONARY/GRAFT ANGIOGRAM
Anesthesia: Moderate Sedation

## 2012-08-29 MED ORDER — SODIUM CHLORIDE 0.9 % IV SOLN: 1.0000 mL/kg/h | INTRAVENOUS | Status: DC

## 2012-08-29 MED ORDER — ONDANSETRON HCL 4 MG/2ML IJ SOLN: 4.0000 mg | Freq: Four times a day (QID) | INTRAMUSCULAR | Status: DC | PRN

## 2012-08-29 MED ORDER — SODIUM CHLORIDE 0.9 % IJ SOLN: 3.0000 mL | INTRAMUSCULAR | Status: DC | PRN

## 2012-08-29 MED ORDER — ACETAMINOPHEN 325 MG PO TABS: 650.0000 mg | ORAL_TABLET | ORAL | Status: DC | PRN

## 2012-08-29 MED ORDER — ASPIRIN 81 MG PO CHEW
324.0000 mg | CHEWABLE_TABLET | ORAL | Status: AC
  Administered 2012-08-29: 243 mg via ORAL

## 2012-08-29 MED ORDER — SODIUM CHLORIDE 0.9 % IJ SOLN: 3.0000 mL | Freq: Two times a day (BID) | INTRAMUSCULAR | Status: DC

## 2012-08-29 MED ORDER — SODIUM CHLORIDE 0.9 % IV SOLN: INTRAVENOUS | Status: DC

## 2012-08-29 MED ORDER — DIAZEPAM 5 MG PO TABS
5.0000 mg | ORAL_TABLET | ORAL | Status: AC
  Administered 2012-08-29: 5 mg via ORAL

## 2012-08-29 MED ORDER — SODIUM CHLORIDE 0.9 % IV SOLN: 250.0000 mL | INTRAVENOUS | Status: DC | PRN

## 2012-08-29 NOTE — OR Nursing (Signed)
Discharge instructions reviewed and signed, pt stated understanding, ambulated in hall without difficulty, site level 0, transported to wife's car via wheelchair 

## 2012-08-29 NOTE — CV Procedure (Signed)
   Cardiac Catheterization Procedure Note  Name: Ralph Gray MRN: [id number] DOB: May 20, 1935  Procedure: Left Heart Cath, Selective Coronary Angiography, LV angiography, aortic root angiography, LIMA angiography, saphenous vein graft angiography   Indication: Moderate risk nuclear stress test, CCS class I angina.   Procedural details: The right groin was prepped, draped, and anesthetized with 1% lidocaine. Using modified Seldinger technique, a 4 French sheath was introduced into the right femoral artery. Standard Judkins catheters were used for coronary angiography and left ventriculography. A 3 DRC catheter was used for LIMA angiography. An RCB catheter was used for saphenous vein graft angiography. An AL-1 catheter was used in an attempt to image the free RIMA to acute marginal branch of the RCA. Catheter exchanges were performed over a guidewire. There were no immediate procedural complications. The patient was transferred to the post catheterization recovery area for further monitoring.  Procedural Findings: Hemodynamics:  AO 150/67 with a mean of 99 LV 147/17   Coronary angiography: Coronary dominance: right  Left mainstem: Heavily calcified. 30% distal left main stenosis.  Left anterior descending (LAD): Total occlusion in the proximal vessel. Heavy calcification is present.  Left circumflex (LCx): There is a large intermediate branch that divides into twin vessels. There is no significant stenosis present. The AV groove circumflex is calcified. There is a long segment 80% stenosis in the proximal circumflex. This extends into the first obtuse marginal branch.  Right coronary artery (RCA): Heavily calcified. Totally occluded in the proximal segment. The PDA and posterolateral branch fill from left to right collaterals supplied by the distal AV groove circumflex.  Saphenous vein graft to first diagonal: Widely patent throughout. There is no significant stenosis. The diagonal  anastomotic site is widely patent. This graft also retrograde fills the LAD which shows no significant stenosis beyond the graft insertion site.  Saphenous vein graft to distal RCA: Severe degenerative disease noted. Totally occluded in the mid body of the graft.  Free RIMA to acute marginal branch of the RCA:  It was imaged subselectively, patent to the acute marginal branch.  This graft is in best seen filling on injections of the saphenous vein graft to RCA.  LIMA to LAD: Widely patent throughout. Competitive filling at the anastomotic site is noted.   Left ventriculography:  there is severe hypokinesis of the basal inferior wall. The anterolateral wall is hypokinetic. The estimated left ventricular ejection fraction is 40%. I do not appreciate any significant mitral regurgitation.   Final Conclusions:   1. Severe native three-vessel coronary artery disease 2. Status post aortocoronary bypass surgery with continued patency of the LIMA to LAD, free RIMA to acute marginal branch of the RCA, and saphenous vein graft to diagonal. 3. Total occlusion of the saphenous vein graft to distal right coronary artery. 4. Moderate segmental left ventricular systolic dysfunction.  Recommendations: The patient has significant stenosis of the native LCx which is not grafted. I do not see any other targets for revascularization. Considering his good functional status and minimal symptoms (essentially no angina), I would recommend continued medical therapy.  Tonny Bollman 08/29/2012, 9:40 AM

## 2012-08-29 NOTE — OR Nursing (Signed)
Dr Cooper at bedside to discuss results and treatment plan with pt and family 

## 2012-08-29 NOTE — Interval H&P Note (Signed)
History and Physical Interval Note:  08/29/2012 8:47 AM  Ralph Gray  has presented today for surgery, with the diagnosis of chest pain  The various methods of treatment have been discussed with the patient and family. After consideration of risks, benefits and other options for treatment, the patient has consented to  Procedure(s) (LRB) with comments: JV LEFT HEART CATHETERIZATION WITH CORONARY/GRAFT ANGIOGRAM (N/A) as a surgical intervention .  The patient's history has been reviewed, patient examined, no change in status, stable for surgery.  I have reviewed the patient's chart and labs.  Questions were answered to the patient's satisfaction.     Tonny Bollman

## 2012-08-29 NOTE — OR Nursing (Signed)
Meal served 

## 2012-08-29 NOTE — H&P (View-Only) (Signed)
HPI:  [name] returns for followup evaluation. He is a 77 year old gentleman with coronary artery disease status post CABG. He is also followed for paroxysmal atrial fibrillation, hypertension, carotid stenosis, and hyperlipidemia. He has been chronically anticoagulated with warfarin.  At the time of his last visit in August 2013 he was noted to have an age-indeterminate anterolateral infarction pattern on his EKG. This was different from his prior study and a stress Myoview was recommended. This demonstrated a small apical scar and mild anterolateral ischemia. His left ventricular ejection fraction was reduced by Myoview. A followup echocardiogram was done in this showed normal left ventricular function. LVEF by echo was estimated at 55-60%.  The patient has just returned from a [place]. He had a great time and did good bit of walking on his trip. He has not had any exertional chest pain or pressure. He's had pain in the left axillary region, but this actually improves with exercise. He's had no dyspnea, palpitations, orthopnea, or PND. He's had problems with a foot ulcer on the lateral aspect of the left foot. This area healed up several months ago, but has become a recurrent problem after he has been walking more.  Outpatient Encounter Prescriptions as of 08/07/2012  Medication Sig Dispense Refill  . aspirin 81 MG tablet Take 81 mg by mouth daily.        Marland Kitchen atorvastatin (LIPITOR) 10 MG tablet Take 1 tablet (10 mg total) by mouth daily.  90 tablet  3  . cefUROXime (CEFTIN) 500 MG tablet Take 1 tablet (500 mg total) by mouth 2 (two) times daily.  14 tablet  0  . felodipine (PLENDIL) 5 MG 24 hr tablet Take 1 tablet (5 mg total) by mouth daily.  90 tablet  1  . fluticasone (VERAMYST) 27.5 MCG/SPRAY nasal spray Place 2 sprays into the nose daily.        Marland Kitchen glimepiride (AMARYL) 1 MG tablet Take 1 tablet (1 mg total) by mouth daily before breakfast.  90 tablet  3  . glucose blood  (ONE TOUCH ULTRA TEST) test strip 1 each by Other route daily. And lancets 1/day 250.00  100 each  12  . L-Methylfolate-Algae-B12-B6 (METANX) 3-90.314-2-35 MG CAPS TAKE 1 CAPSULE DAILY  90 capsule  3  . losartan (COZAAR) 50 MG tablet TAKE 1 TABLET TWICE A DAY  180 tablet  0  . metformin (FORTAMET) 1000 MG (OSM) 24 hr tablet Take 2,000 mg by mouth daily with breakfast.      . metoprolol succinate (TOPROL-XL) 25 MG 24 hr tablet Take 0.5 tablets (12.5 mg total) by mouth daily. Take 1/2 tablet daily  45 tablet  1  . montelukast (SINGULAIR) 10 MG tablet Take 1 tablet (10 mg total) by mouth at bedtime.  90 tablet  1  . pregabalin (LYRICA) 75 MG capsule Take 1 capsule (75 mg total) by mouth 2 (two) times daily.  180 capsule  3  . sitaGLIPtin (JANUVIA) 100 MG tablet Take 1 tablet (100 mg total) by mouth daily.  90 tablet  3  . Tamsulosin HCl (FLOMAX) 0.4 MG CAPS Take 0.8 mg by mouth daily.      Marland Kitchen warfarin (COUMADIN) 6 MG tablet Take 6-9 mg by mouth daily. Takes 9 mg on Monday, Wednesday, Friday and Saturday and 6 mg all other days        Allergies  Allergen Reactions  . Amoxicillin Anaphylaxis  . Other Other (See Comments)    Muscle Relaxers; "I  get very woozy"    Past Medical History  Diagnosis Date  . Hypertension   . Hyperlipidemia   . Coronary artery disease     MI '99  . Peripheral neuropathy   . Macular degeneration     has had intra-orbital injections. Has had laser treatment  . Prostate cancer     XRT 45 tx  . CLL (chronic lymphoblastic leukemia)   . Atrial fibrillation 2006    started 2006  . Type II diabetes mellitus   . Hepatitis C infection 1990's    ROS: Negative except as per HPI  There were no vitals taken for this visit.  PHYSICAL EXAM: Pt is alert and oriented, NAD HEENT: normal Neck: JVP - normal, carotids 2+= without bruits Lungs: CTA bilaterally CV: RRR without murmur or gallop Abd: soft, NT, Positive BS, no hepatomegaly Ext: no C/C/E, distal pulses intact  and equal Skin: warm/dry no rash  2D Echo: 08/01/2012: Left ventricle: The cavity size was mildly dilated. Wall thickness was normal. Systolic function was normal. The estimated ejection fraction was in the range of 55% to 60%. Wall motion was normal; there were no regional wall motion abnormalities. Doppler parameters are consistent with abnormal left ventricular relaxation (grade 1 diastolic dysfunction).  ------------------------------------------------------------ Aortic valve: Trileaflet; mildly thickened, mildly calcified leaflets. Sclerosis without stenosis. Doppler: No regurgitation.  ------------------------------------------------------------ Aorta: Aortic root: The aortic root was normal in size. Ascending aorta: The ascending aorta was normal in size.  ------------------------------------------------------------ Mitral valve: Mildly thickened leaflets . Doppler: No significant regurgitation. Peak gradient: 2mm Hg (D).  ------------------------------------------------------------ Left atrium: The atrium was moderately dilated.  ------------------------------------------------------------ Right ventricle: The cavity size was normal. Wall thickness was normal. Systolic function was normal.  ------------------------------------------------------------ Pulmonic valve: Structurally normal valve. Cusp separation was normal. Doppler: Transvalvular velocity was within the normal range. Trivial regurgitation.  ------------------------------------------------------------ Tricuspid valve: Structurally normal valve. Leaflet separation was normal. Doppler: Transvalvular velocity was within the normal range. Mild regurgitation.  ------------------------------------------------------------ Right atrium: The atrium was normal in size.  ------------------------------------------------------------ Pericardium: There was no pericardial  effusion.  ------------------------------------------------------------  Myoview Stress: QPS  Raw Data Images: Normal; no motion artifact; normal heart/lung ratio.  Stress Images: Small to moderate-sized, mild mid to apical anterior perfusion defect.  Rest Images: Small, mild apical anterior perfusion defect.  Subtraction (SDS): Small to moderate mid to apical anterior mild perfusion defect noted on stress that is partially reversible.  Transient Ischemic Dilatation (Normal <1.22): 1.08  Lung/Heart Ratio (Normal <0.45): 0.45  Quantitative Gated Spect Images  QGS EDV: 162 ml  QGS ESV: 38 ml  Impression  Exercise Capacity: Good exercise capacity.  BP Response: Normal blood pressure response.  Clinical Symptoms: Short of breath, fatigued.  ECG Impression: No significant ST segment change suggestive of ischemia.  Comparison with Prior Nuclear Study: No images to compare  Overall Impression: Low to moderate risk stress test. There is a small to moderate, partially reversible mild mid to apical anterior perfusion defect. This likely represents prior infarction with significant peri-infarct ischemia. EF is mildly depressed with septal and apical hypokinesis.  LV Ejection Fraction: 46%. LV Wall Motion: Septal and apical hypokinesis.  Marca Ancona  07/11/2012   ASSESSMENT AND PLAN: 1. Coronary artery disease, native vessel. The patient is status post CABG now 14 years out from revascularization surgery. His EKG and stress Myoview suggest possible anteroapical infarction with significant peri-infarct ischemia. However, it is interesting that his 2-D echocardiogram shows normal left ventricular function. I discussed the discrepant data  with the patient and his wife. We discussed the risks, potential benefits, and alternatives to a definitive evaluation with cardiac catheterization. I think it would be reasonable to proceed with cardiac catheterization considering this patient's diabetes, abnormal EKG,  abnormal nuclear scan, and risk for silent ischemia. I discussed the patient's case in detail with his longtime cardiologist in [place], Dr. Jens Som. He agrees with cardiac cath for definitive evaluation and I conveyed this to the patient in a telephone call this evening. The patient is in agreement to cath and possible PCI and this will be scheduled.  2. Essential hypertension, well controlled. He will continue his current medical program.  3. Paroxysmal atrial fibrillation. He remains in sinus rhythm. He is anticoagulated with warfarin. If he decides to have cardiac catheterization, we will hold his warfarin prior to the procedure. He has no history of stroke or TIA and I do not think bridging therapy is indicated.  Tonny Bollman 08/07/2012 10:55 AM

## 2012-08-29 NOTE — OR Nursing (Signed)
Tegaderm dressing applied, site level 0, bedrest begins at 0955 

## 2012-08-30 DIAGNOSIS — E1169 Type 2 diabetes mellitus with other specified complication: Secondary | ICD-10-CM | POA: Diagnosis not present

## 2012-08-30 DIAGNOSIS — I2510 Atherosclerotic heart disease of native coronary artery without angina pectoris: Secondary | ICD-10-CM | POA: Diagnosis not present

## 2012-08-30 DIAGNOSIS — L97509 Non-pressure chronic ulcer of other part of unspecified foot with unspecified severity: Secondary | ICD-10-CM | POA: Diagnosis not present

## 2012-08-30 DIAGNOSIS — E785 Hyperlipidemia, unspecified: Secondary | ICD-10-CM | POA: Diagnosis not present

## 2012-08-30 DIAGNOSIS — I10 Essential (primary) hypertension: Secondary | ICD-10-CM | POA: Diagnosis not present

## 2012-08-30 DIAGNOSIS — G589 Mononeuropathy, unspecified: Secondary | ICD-10-CM | POA: Diagnosis not present

## 2012-09-01 ENCOUNTER — Other Ambulatory Visit: Payer: Self-pay | Admitting: Internal Medicine

## 2012-09-01 MED ORDER — TAMSULOSIN HCL 0.4 MG PO CAPS: 0.4000 mg | ORAL_CAPSULE | Freq: Every day | ORAL | Status: DC

## 2012-09-01 NOTE — Telephone Encounter (Signed)
Pt called stating [facility] did not receive the rx for his medication. Pt is also requesting a 7-8 day supply from [facility] so that he will have enough to last until his mail order arrives. Please advise.

## 2012-09-01 NOTE — Telephone Encounter (Signed)
Pt has not received the rx for Tamsulosin.  He uses [facility] [phone]).  The pharmacy has not gotten the rx.  He also wants a 7-8 day supply sent to [facility] at [facility] and [address] to hold him until the mail order can arrive.

## 2012-09-04 ENCOUNTER — Telehealth: Payer: Self-pay | Admitting: *Deleted

## 2012-09-04 ENCOUNTER — Other Ambulatory Visit: Payer: Self-pay | Admitting: *Deleted

## 2012-09-04 MED ORDER — TAMSULOSIN HCL 0.4 MG PO CAPS: 0.4000 mg | ORAL_CAPSULE | Freq: Every day | ORAL | Status: DC

## 2012-09-04 NOTE — Telephone Encounter (Signed)
Pt called requesting a 10 day supply until his Rx comes from [facility]

## 2012-09-04 NOTE — Telephone Encounter (Signed)
Opened encounter in error  

## 2012-09-06 ENCOUNTER — Other Ambulatory Visit: Payer: Self-pay | Admitting: Internal Medicine

## 2012-09-06 DIAGNOSIS — E785 Hyperlipidemia, unspecified: Secondary | ICD-10-CM | POA: Diagnosis not present

## 2012-09-06 DIAGNOSIS — I10 Essential (primary) hypertension: Secondary | ICD-10-CM | POA: Diagnosis not present

## 2012-09-06 DIAGNOSIS — G589 Mononeuropathy, unspecified: Secondary | ICD-10-CM | POA: Diagnosis not present

## 2012-09-06 DIAGNOSIS — L97509 Non-pressure chronic ulcer of other part of unspecified foot with unspecified severity: Secondary | ICD-10-CM | POA: Diagnosis not present

## 2012-09-06 DIAGNOSIS — E1169 Type 2 diabetes mellitus with other specified complication: Secondary | ICD-10-CM | POA: Diagnosis not present

## 2012-09-06 DIAGNOSIS — I2510 Atherosclerotic heart disease of native coronary artery without angina pectoris: Secondary | ICD-10-CM | POA: Diagnosis not present

## 2012-09-06 MED ORDER — TAMSULOSIN HCL 0.4 MG PO CAPS: 0.4000 mg | ORAL_CAPSULE | Freq: Every day | ORAL | Status: DC

## 2012-09-06 NOTE — Telephone Encounter (Signed)
Please new rx to [facility], pt states that he takes 2 tabs once a day of flomax. Must be done today.

## 2012-09-11 ENCOUNTER — Telehealth: Payer: Self-pay | Admitting: Internal Medicine

## 2012-09-11 DIAGNOSIS — T8189XA Other complications of procedures, not elsewhere classified, initial encounter: Secondary | ICD-10-CM | POA: Diagnosis not present

## 2012-09-11 NOTE — Telephone Encounter (Signed)
The patient called and is hoping to get a call back regarding his prescriptions.  His callback number is [phone], thanks!

## 2012-09-12 ENCOUNTER — Other Ambulatory Visit: Payer: Self-pay | Admitting: *Deleted

## 2012-09-12 ENCOUNTER — Telehealth: Payer: Self-pay | Admitting: *Deleted

## 2012-09-12 MED ORDER — TAMSULOSIN HCL 0.4 MG PO CAPS: 0.4000 mg | ORAL_CAPSULE | Freq: Every day | ORAL | Status: DC

## 2012-09-12 MED ORDER — TAMSULOSIN HCL 0.4 MG PO CAPS: 0.4000 mg | ORAL_CAPSULE | Freq: Two times a day (BID) | ORAL | Status: DC

## 2012-09-12 NOTE — Telephone Encounter (Signed)
Rx clarified and completed for tamulosin (flomax) 0.4mg  2 tablets at bedtime. #180 refills 2.

## 2012-09-12 NOTE — Telephone Encounter (Signed)
[facility] called and they need further information in order to fill this medication, you can call [phone]

## 2012-09-12 NOTE — Telephone Encounter (Signed)
Open in error

## 2012-09-12 NOTE — Telephone Encounter (Signed)
Patient says that he needs to have his flomax (generic) sent in to [facility] for 2 tablets a day a 90 day supply 180 tablets

## 2012-09-13 ENCOUNTER — Other Ambulatory Visit: Payer: Self-pay | Admitting: Internal Medicine

## 2012-09-13 ENCOUNTER — Encounter (HOSPITAL_BASED_OUTPATIENT_CLINIC_OR_DEPARTMENT_OTHER): Payer: Medicare Other | Attending: General Surgery

## 2012-09-13 DIAGNOSIS — L97509 Non-pressure chronic ulcer of other part of unspecified foot with unspecified severity: Secondary | ICD-10-CM | POA: Insufficient documentation

## 2012-09-13 DIAGNOSIS — E1169 Type 2 diabetes mellitus with other specified complication: Secondary | ICD-10-CM | POA: Diagnosis not present

## 2012-09-13 MED ORDER — TAMSULOSIN HCL 0.4 MG PO CAPS: 0.4000 mg | ORAL_CAPSULE | Freq: Every day | ORAL | Status: DC

## 2012-09-18 DIAGNOSIS — L97509 Non-pressure chronic ulcer of other part of unspecified foot with unspecified severity: Secondary | ICD-10-CM | POA: Diagnosis not present

## 2012-09-18 DIAGNOSIS — E1169 Type 2 diabetes mellitus with other specified complication: Secondary | ICD-10-CM | POA: Diagnosis not present

## 2012-09-18 LAB — GLUCOSE, CAPILLARY
Glucose-Capillary: 168 mg/dL — ABNORMAL HIGH (ref 70–99)
Glucose-Capillary: 111 mg/dL — ABNORMAL HIGH (ref 70–99)

## 2012-09-19 DIAGNOSIS — E1169 Type 2 diabetes mellitus with other specified complication: Secondary | ICD-10-CM | POA: Diagnosis not present

## 2012-09-19 DIAGNOSIS — L97509 Non-pressure chronic ulcer of other part of unspecified foot with unspecified severity: Secondary | ICD-10-CM | POA: Diagnosis not present

## 2012-09-19 LAB — GLUCOSE, CAPILLARY
Glucose-Capillary: 139 mg/dL — ABNORMAL HIGH (ref 70–99)
Glucose-Capillary: 82 mg/dL (ref 70–99)

## 2012-09-20 DIAGNOSIS — L97509 Non-pressure chronic ulcer of other part of unspecified foot with unspecified severity: Secondary | ICD-10-CM | POA: Diagnosis not present

## 2012-09-20 DIAGNOSIS — E1169 Type 2 diabetes mellitus with other specified complication: Secondary | ICD-10-CM | POA: Diagnosis not present

## 2012-09-20 LAB — GLUCOSE, CAPILLARY
Glucose-Capillary: 187 mg/dL — ABNORMAL HIGH (ref 70–99)
Glucose-Capillary: 106 mg/dL — ABNORMAL HIGH (ref 70–99)

## 2012-09-21 DIAGNOSIS — L97509 Non-pressure chronic ulcer of other part of unspecified foot with unspecified severity: Secondary | ICD-10-CM | POA: Diagnosis not present

## 2012-09-21 DIAGNOSIS — E1169 Type 2 diabetes mellitus with other specified complication: Secondary | ICD-10-CM | POA: Diagnosis not present

## 2012-09-21 LAB — GLUCOSE, CAPILLARY: Glucose-Capillary: 204 mg/dL — ABNORMAL HIGH (ref 70–99)

## 2012-09-22 ENCOUNTER — Encounter (HOSPITAL_BASED_OUTPATIENT_CLINIC_OR_DEPARTMENT_OTHER): Payer: Medicare Other

## 2012-09-22 DIAGNOSIS — L97509 Non-pressure chronic ulcer of other part of unspecified foot with unspecified severity: Secondary | ICD-10-CM | POA: Diagnosis not present

## 2012-09-22 DIAGNOSIS — E1169 Type 2 diabetes mellitus with other specified complication: Secondary | ICD-10-CM | POA: Diagnosis not present

## 2012-09-25 ENCOUNTER — Ambulatory Visit (INDEPENDENT_AMBULATORY_CARE_PROVIDER_SITE_OTHER): Payer: Medicare Other | Admitting: Pharmacist

## 2012-09-25 DIAGNOSIS — E1169 Type 2 diabetes mellitus with other specified complication: Secondary | ICD-10-CM | POA: Diagnosis not present

## 2012-09-25 DIAGNOSIS — IMO0002 Reserved for concepts with insufficient information to code with codable children: Secondary | ICD-10-CM

## 2012-09-25 DIAGNOSIS — Z7901 Long term (current) use of anticoagulants: Secondary | ICD-10-CM

## 2012-09-25 DIAGNOSIS — L97509 Non-pressure chronic ulcer of other part of unspecified foot with unspecified severity: Secondary | ICD-10-CM | POA: Diagnosis not present

## 2012-09-25 LAB — GLUCOSE, CAPILLARY
Glucose-Capillary: 149 mg/dL — ABNORMAL HIGH (ref 70–99)
Glucose-Capillary: 108 mg/dL — ABNORMAL HIGH (ref 70–99)

## 2012-09-25 LAB — POCT INR: INR: 2.5

## 2012-09-26 DIAGNOSIS — E1169 Type 2 diabetes mellitus with other specified complication: Secondary | ICD-10-CM | POA: Diagnosis not present

## 2012-09-26 DIAGNOSIS — L97509 Non-pressure chronic ulcer of other part of unspecified foot with unspecified severity: Secondary | ICD-10-CM | POA: Diagnosis not present

## 2012-09-26 LAB — GLUCOSE, CAPILLARY: Glucose-Capillary: 119 mg/dL — ABNORMAL HIGH (ref 70–99)

## 2012-09-27 DIAGNOSIS — L97509 Non-pressure chronic ulcer of other part of unspecified foot with unspecified severity: Secondary | ICD-10-CM | POA: Diagnosis not present

## 2012-09-27 DIAGNOSIS — E1169 Type 2 diabetes mellitus with other specified complication: Secondary | ICD-10-CM | POA: Diagnosis not present

## 2012-09-27 LAB — GLUCOSE, CAPILLARY: Glucose-Capillary: 153 mg/dL — ABNORMAL HIGH (ref 70–99)

## 2012-09-28 DIAGNOSIS — L97509 Non-pressure chronic ulcer of other part of unspecified foot with unspecified severity: Secondary | ICD-10-CM | POA: Diagnosis not present

## 2012-09-28 DIAGNOSIS — E1169 Type 2 diabetes mellitus with other specified complication: Secondary | ICD-10-CM | POA: Diagnosis not present

## 2012-09-28 LAB — GLUCOSE, CAPILLARY: Glucose-Capillary: 149 mg/dL — ABNORMAL HIGH (ref 70–99)

## 2012-09-29 ENCOUNTER — Other Ambulatory Visit: Payer: Self-pay | Admitting: Internal Medicine

## 2012-09-29 DIAGNOSIS — L97509 Non-pressure chronic ulcer of other part of unspecified foot with unspecified severity: Secondary | ICD-10-CM | POA: Diagnosis not present

## 2012-09-29 DIAGNOSIS — E1169 Type 2 diabetes mellitus with other specified complication: Secondary | ICD-10-CM | POA: Diagnosis not present

## 2012-10-02 DIAGNOSIS — E1169 Type 2 diabetes mellitus with other specified complication: Secondary | ICD-10-CM | POA: Diagnosis not present

## 2012-10-02 DIAGNOSIS — L98499 Non-pressure chronic ulcer of skin of other sites with unspecified severity: Secondary | ICD-10-CM | POA: Diagnosis not present

## 2012-10-02 DIAGNOSIS — T8189XA Other complications of procedures, not elsewhere classified, initial encounter: Secondary | ICD-10-CM | POA: Diagnosis not present

## 2012-10-02 DIAGNOSIS — L97509 Non-pressure chronic ulcer of other part of unspecified foot with unspecified severity: Secondary | ICD-10-CM | POA: Diagnosis not present

## 2012-10-02 LAB — GLUCOSE, CAPILLARY
Glucose-Capillary: 155 mg/dL — ABNORMAL HIGH (ref 70–99)
Glucose-Capillary: 124 mg/dL — ABNORMAL HIGH (ref 70–99)

## 2012-10-03 ENCOUNTER — Other Ambulatory Visit: Payer: Self-pay | Admitting: *Deleted

## 2012-10-03 MED ORDER — FLUTICASONE FUROATE 27.5 MCG/SPRAY NA SUSP: 2.0000 | Freq: Every day | NASAL | Status: DC

## 2012-10-09 IMAGING — CR DG CHEST 2V
2 series · 2 of 2 positions shown · non-contrast
Comparison: None.

CLINICAL DATA: Fever.

CHEST - 2 VIEW

[w chest lat]
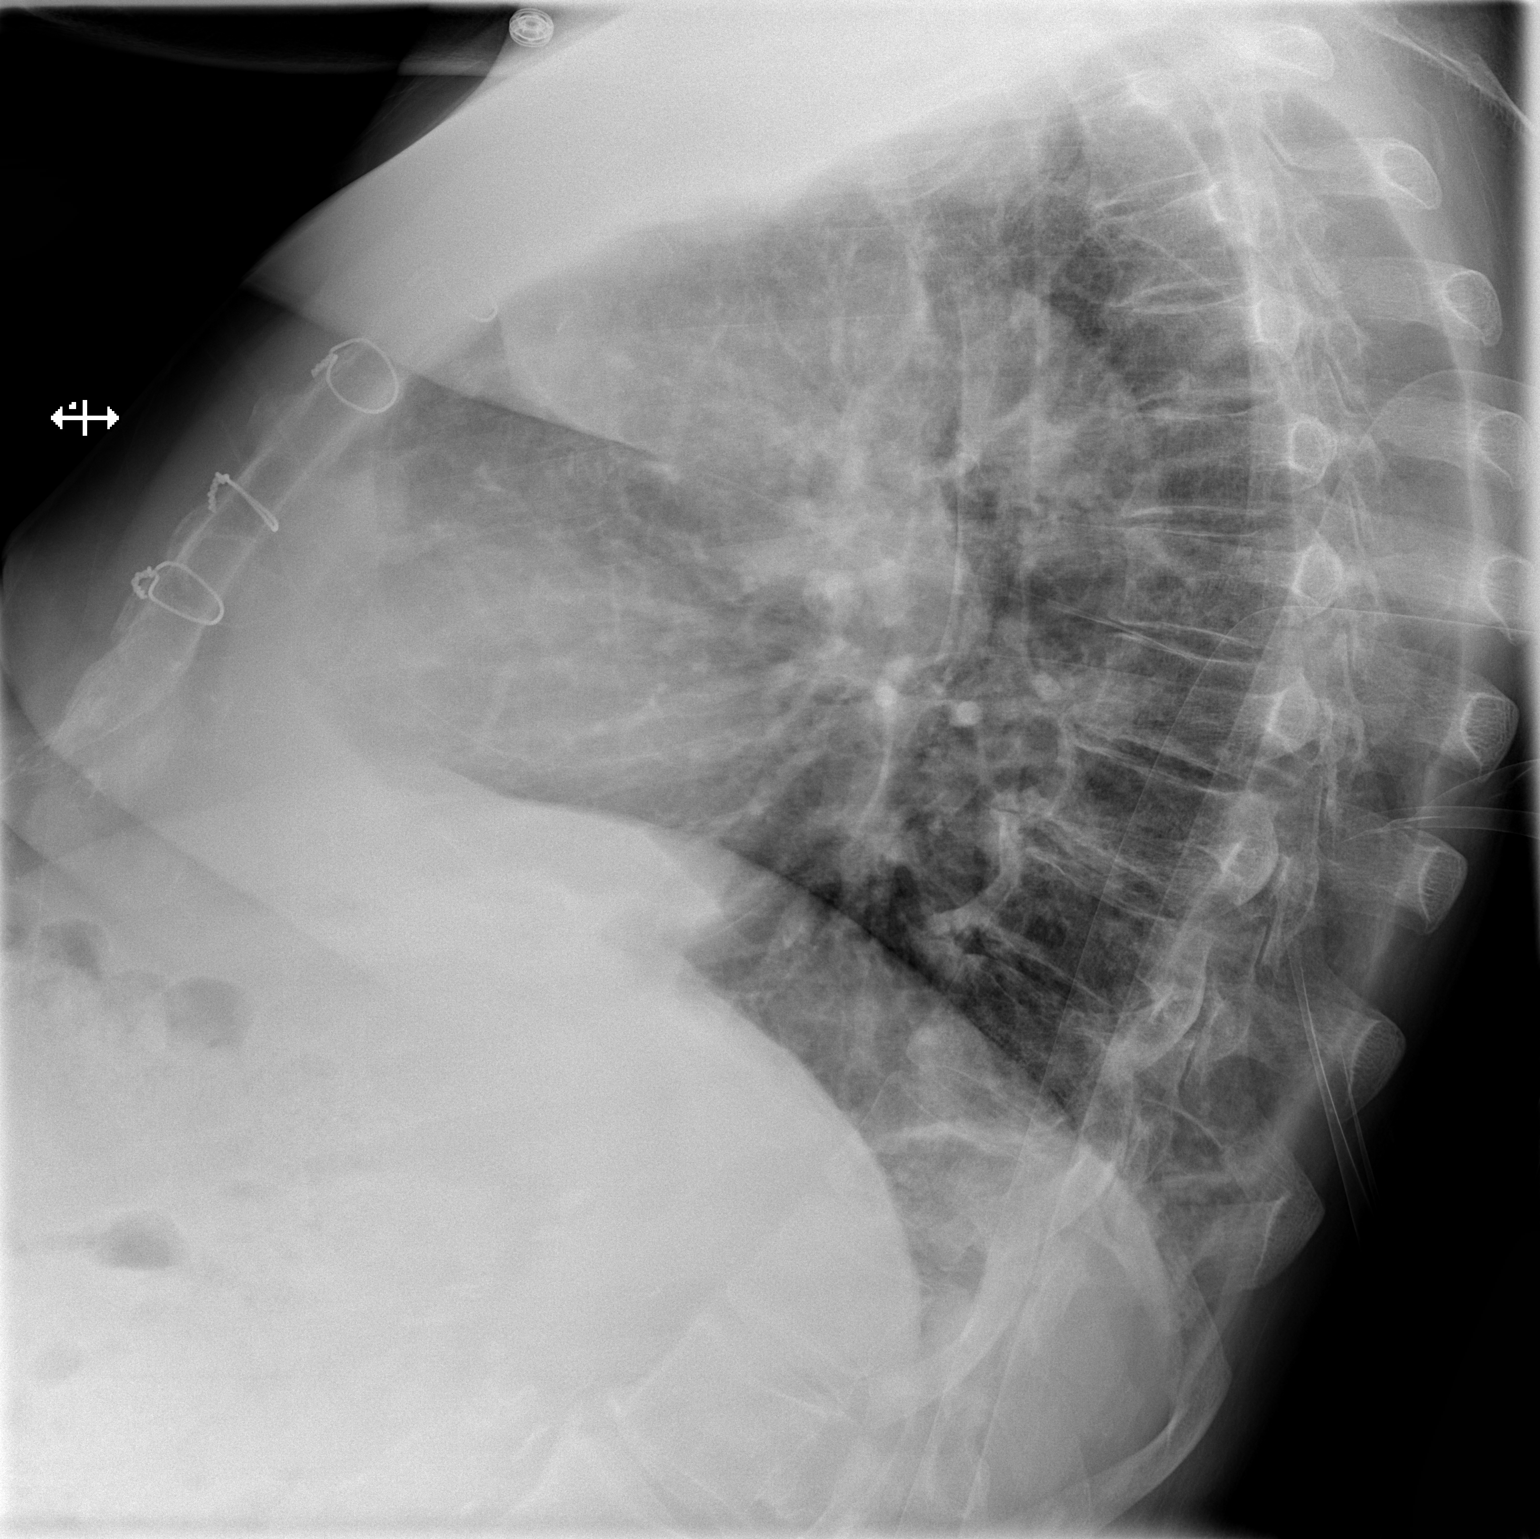

[x chest ap]
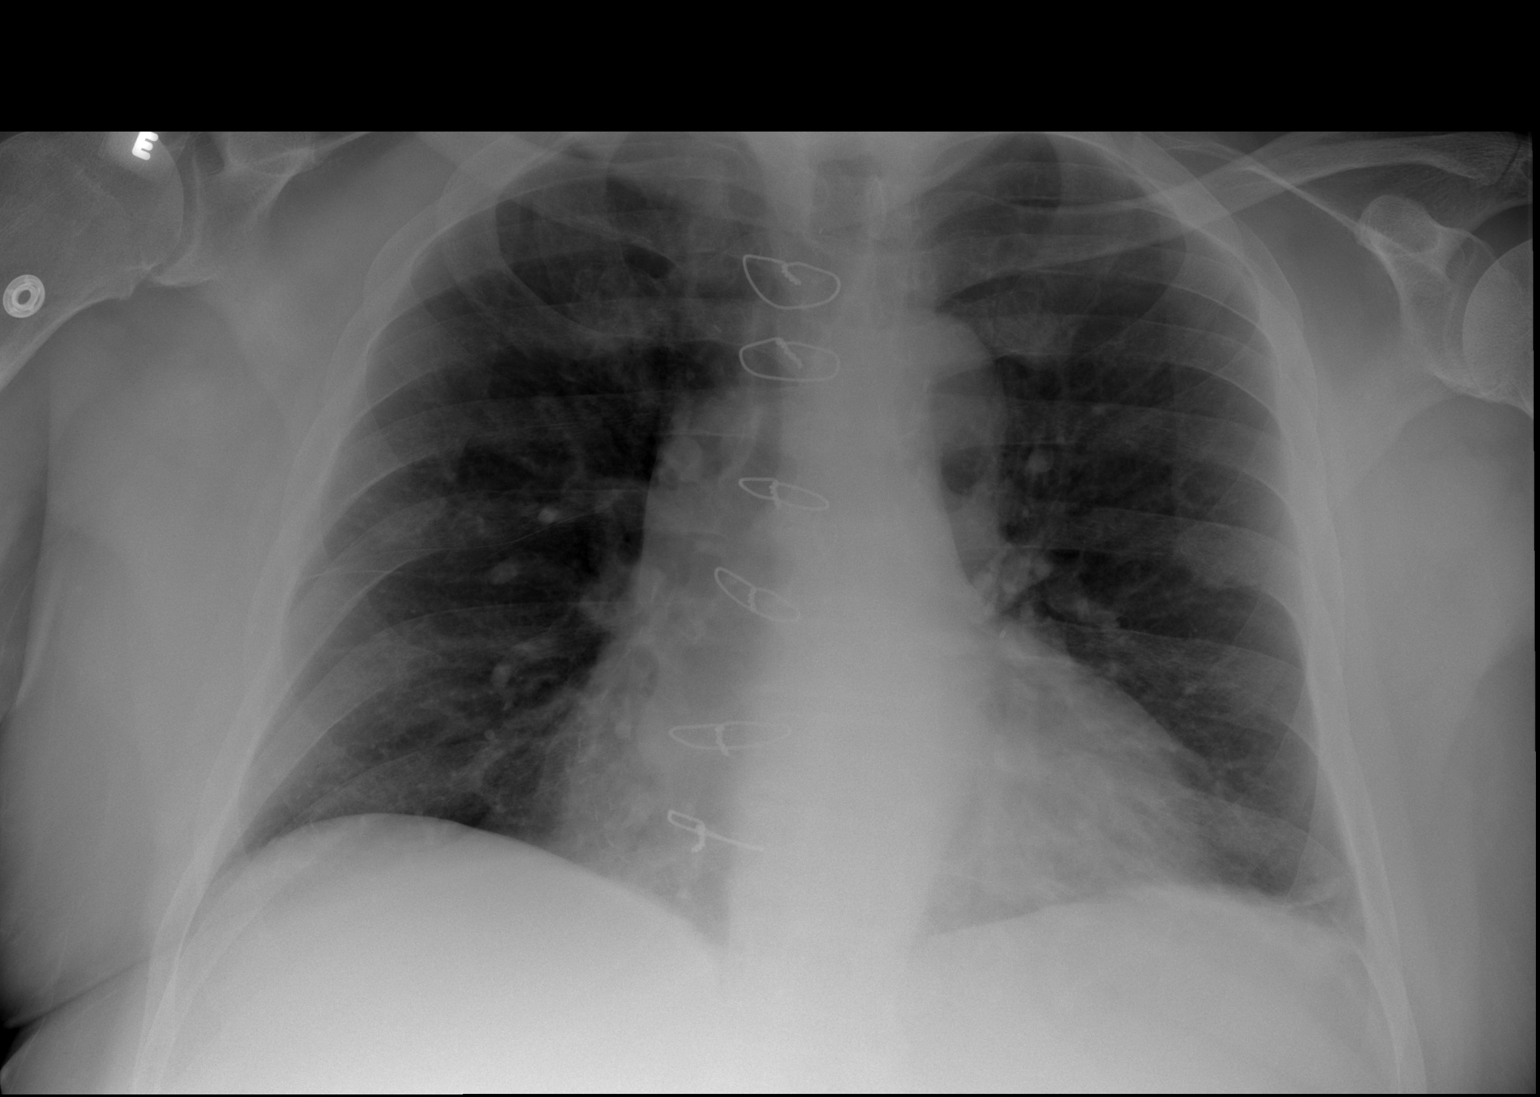

[2 of 2 positions shown; findings below may reference images not displayed]

FINDINGS: There is cardiomegaly without edema.  Mild subsegmental
atelectasis left lung base is noted.  Right lung clear.  No
consolidative process, pneumothorax or effusion.
IMPRESSION: Cardiomegaly without acute disease.

## 2012-10-12 ENCOUNTER — Encounter (HOSPITAL_BASED_OUTPATIENT_CLINIC_OR_DEPARTMENT_OTHER): Payer: Medicare Other | Attending: General Surgery

## 2012-10-12 DIAGNOSIS — L97509 Non-pressure chronic ulcer of other part of unspecified foot with unspecified severity: Secondary | ICD-10-CM | POA: Insufficient documentation

## 2012-10-12 DIAGNOSIS — E1169 Type 2 diabetes mellitus with other specified complication: Secondary | ICD-10-CM | POA: Insufficient documentation

## 2012-10-12 DIAGNOSIS — L84 Corns and callosities: Secondary | ICD-10-CM | POA: Insufficient documentation

## 2012-10-13 DIAGNOSIS — L84 Corns and callosities: Secondary | ICD-10-CM | POA: Diagnosis not present

## 2012-10-13 DIAGNOSIS — E1169 Type 2 diabetes mellitus with other specified complication: Secondary | ICD-10-CM | POA: Diagnosis not present

## 2012-10-13 DIAGNOSIS — L97509 Non-pressure chronic ulcer of other part of unspecified foot with unspecified severity: Secondary | ICD-10-CM | POA: Diagnosis not present

## 2012-10-13 LAB — GLUCOSE, CAPILLARY: Glucose-Capillary: 159 mg/dL — ABNORMAL HIGH (ref 70–99)

## 2012-10-16 DIAGNOSIS — E1169 Type 2 diabetes mellitus with other specified complication: Secondary | ICD-10-CM | POA: Diagnosis not present

## 2012-10-16 DIAGNOSIS — L97509 Non-pressure chronic ulcer of other part of unspecified foot with unspecified severity: Secondary | ICD-10-CM | POA: Diagnosis not present

## 2012-10-16 DIAGNOSIS — L84 Corns and callosities: Secondary | ICD-10-CM | POA: Diagnosis not present

## 2012-10-17 ENCOUNTER — Other Ambulatory Visit: Payer: Self-pay | Admitting: Internal Medicine

## 2012-10-17 DIAGNOSIS — L84 Corns and callosities: Secondary | ICD-10-CM | POA: Diagnosis not present

## 2012-10-17 DIAGNOSIS — E1169 Type 2 diabetes mellitus with other specified complication: Secondary | ICD-10-CM | POA: Diagnosis not present

## 2012-10-17 DIAGNOSIS — L97509 Non-pressure chronic ulcer of other part of unspecified foot with unspecified severity: Secondary | ICD-10-CM | POA: Diagnosis not present

## 2012-10-17 LAB — GLUCOSE, CAPILLARY
Glucose-Capillary: 164 mg/dL — ABNORMAL HIGH (ref 70–99)
Glucose-Capillary: 145 mg/dL — ABNORMAL HIGH (ref 70–99)

## 2012-10-18 DIAGNOSIS — L84 Corns and callosities: Secondary | ICD-10-CM | POA: Diagnosis not present

## 2012-10-18 DIAGNOSIS — L97509 Non-pressure chronic ulcer of other part of unspecified foot with unspecified severity: Secondary | ICD-10-CM | POA: Diagnosis not present

## 2012-10-18 DIAGNOSIS — E1169 Type 2 diabetes mellitus with other specified complication: Secondary | ICD-10-CM | POA: Diagnosis not present

## 2012-10-18 LAB — GLUCOSE, CAPILLARY
Glucose-Capillary: 176 mg/dL — ABNORMAL HIGH (ref 70–99)
Glucose-Capillary: 133 mg/dL — ABNORMAL HIGH (ref 70–99)

## 2012-10-19 DIAGNOSIS — L97509 Non-pressure chronic ulcer of other part of unspecified foot with unspecified severity: Secondary | ICD-10-CM | POA: Diagnosis not present

## 2012-10-19 DIAGNOSIS — E1169 Type 2 diabetes mellitus with other specified complication: Secondary | ICD-10-CM | POA: Diagnosis not present

## 2012-10-19 DIAGNOSIS — L84 Corns and callosities: Secondary | ICD-10-CM | POA: Diagnosis not present

## 2012-10-19 LAB — GLUCOSE, CAPILLARY: Glucose-Capillary: 141 mg/dL — ABNORMAL HIGH (ref 70–99)

## 2012-10-20 DIAGNOSIS — L84 Corns and callosities: Secondary | ICD-10-CM | POA: Diagnosis not present

## 2012-10-20 DIAGNOSIS — L97509 Non-pressure chronic ulcer of other part of unspecified foot with unspecified severity: Secondary | ICD-10-CM | POA: Diagnosis not present

## 2012-10-20 DIAGNOSIS — E1169 Type 2 diabetes mellitus with other specified complication: Secondary | ICD-10-CM | POA: Diagnosis not present

## 2012-10-20 LAB — GLUCOSE, CAPILLARY
Glucose-Capillary: 171 mg/dL — ABNORMAL HIGH (ref 70–99)
Glucose-Capillary: 140 mg/dL — ABNORMAL HIGH (ref 70–99)

## 2012-10-23 ENCOUNTER — Ambulatory Visit (INDEPENDENT_AMBULATORY_CARE_PROVIDER_SITE_OTHER): Payer: Medicare Other | Admitting: Endocrinology

## 2012-10-23 VITALS — BP 126/80 | HR 72 | Wt 216.0 lb

## 2012-10-23 DIAGNOSIS — E119 Type 2 diabetes mellitus without complications: Secondary | ICD-10-CM | POA: Diagnosis not present

## 2012-10-23 DIAGNOSIS — M79609 Pain in unspecified limb: Secondary | ICD-10-CM | POA: Diagnosis not present

## 2012-10-23 DIAGNOSIS — B351 Tinea unguium: Secondary | ICD-10-CM | POA: Diagnosis not present

## 2012-10-23 DIAGNOSIS — L84 Corns and callosities: Secondary | ICD-10-CM | POA: Diagnosis not present

## 2012-10-23 DIAGNOSIS — L97509 Non-pressure chronic ulcer of other part of unspecified foot with unspecified severity: Secondary | ICD-10-CM | POA: Diagnosis not present

## 2012-10-23 DIAGNOSIS — E1169 Type 2 diabetes mellitus with other specified complication: Secondary | ICD-10-CM | POA: Diagnosis not present

## 2012-10-23 LAB — HEMOGLOBIN A1C: Hgb A1c MFr Bld: 7 % — ABNORMAL HIGH (ref 4.6–6.5)

## 2012-10-23 LAB — GLUCOSE, CAPILLARY
Glucose-Capillary: 174 mg/dL — ABNORMAL HIGH (ref 70–99)
Glucose-Capillary: 138 mg/dL — ABNORMAL HIGH (ref 70–99)

## 2012-10-23 MED ORDER — BROMOCRIPTINE MESYLATE 2.5 MG PO TABS: 2.5000 mg | ORAL_TABLET | Freq: Every day | ORAL | Status: DC

## 2012-10-23 NOTE — Patient Instructions (Addendum)
check your blood sugar once a day.  vary the time of day when you check, between before the 3 meals, and at bedtime.  also check if you have symptoms of your blood sugar being too high or too low.  please keep a record of the readings and bring it to your next appointment here.  please call us sooner if your blood sugar goes below 70, or if you have a lot of readings over 200.   A blood sugar test is being requested for you today.  We'll contact you with results. Please come back for a follow-up appointment in 3 months.   Please change glimepiride to "bromocriptine," to help your blood sugar. It has possible side effects of nausea and dizziness.  These go away with time.  You can avoid these by taking it at bedtime, and by taking just take 1/2 pill for the first week.

## 2012-10-23 NOTE — Progress Notes (Signed)
Subjective:    Patient ID: Ralph Gray, male    DOB: 1934-12-06, 78 y.o.   MRN: [id number]  HPI pt returns for f/u of type 2 DM (dx'ed 2010; complicated by peripheral sensory neuropathy, foot ulcer, CAD and PAD; he has never been on insulin; he can't take actos due to edema).  no cbg record, but states cbg's are in the mid-100's.  pt states he feels well in general. Past Medical History  Diagnosis Date  . Hypertension   . Hyperlipidemia   . Coronary artery disease     MI '99  . Peripheral neuropathy   . Macular degeneration     has had intra-orbital injections. Has had laser treatment  . Prostate cancer     XRT 47 tx  . CLL (chronic lymphoblastic leukemia)   . Atrial fibrillation 2006    started 2006  . Type II diabetes mellitus   . Hepatitis C infection 1990's    Past Surgical History  Procedure Date  . Cardioversion   . Coronary artery bypass graft 1999    CABG [place]4; [place] [facility]     History   Social History  . Marital Status: Married    Spouse Name: N/A    Number of Children: N/A  . Years of Education: N/A   Occupational History  . Retired    Social History Main Topics  . Smoking status: Former Smoker -- 2.5 packs/day for 10 years    Types: Cigarettes    Quit date: 10/14/1963  . Smokeless tobacco: Never Used  . Alcohol Use: Yes     Comment: wine rarely  . Drug Use: No  . Sexually Active: Yes -- Male partner(s)   Other Topics Concern  . Not on file   Social History Narrative   [employer], [name], EDD - doctorate in education. Married '64 - 81yr/divorced ( severe bipolar disease); Married '07. 2 sons - '67, '66; 1 dtr - '70. 7 grandchildren. Exercise - 6 days a week: cardio and strengthening. 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 ACP- yes for CPR; no -long term ventilation; no heroic or futile measures.     Current Outpatient Prescriptions on  File Prior to Visit  Medication Sig Dispense Refill  . aspirin 81 MG tablet Take 81 mg by mouth daily.        Marland Kitchen atorvastatin (LIPITOR) 10 MG tablet Take 1 tablet (10 mg total) by mouth daily.  90 tablet  3  . cephALEXin (KEFLEX) 500 MG capsule       . doxycycline (DORYX) 100 MG DR capsule Take 100 mg by mouth 2 (two) times daily.      . felodipine (PLENDIL) 5 MG 24 hr tablet TAKE 1 TABLET DAILY  90 tablet  3  . fluticasone (VERAMYST) 27.5 MCG/SPRAY nasal spray Place 2 sprays into the nose daily.  10 g  3  . glucose blood (ONE TOUCH ULTRA TEST) test strip 1 each by Other route daily. And lancets 1/day 250.00  100 each  12  . IODOSORB 0.9 % gel       . L-Methylfolate-Algae-B12-B6 (METANX) 3-90.314-2-35 MG CAPS TAKE 1 CAPSULE DAILY  90 capsule  3  . Lancets (ONETOUCH ULTRASOFT) lancets       . losartan (COZAAR) 50 MG tablet TAKE 1 TABLET TWICE A DAY  180 tablet  0  . metformin (FORTAMET) 1000 MG (OSM) 24 hr tablet TAKE 1 TABLET  TWICE A DAY WITH MEALS  180 tablet  0  . metoprolol succinate (TOPROL-XL) 25 MG 24 hr tablet TAKE ONE-HALF (1/2) TABLET (12.5 MG TOTAL) DAILY  45 tablet  3  . montelukast (SINGULAIR) 10 MG tablet Take 1 tablet (10 mg total) by mouth at bedtime.  90 tablet  1  . pregabalin (LYRICA) 75 MG capsule Take 1 capsule (75 mg total) by mouth 2 (two) times daily.  180 capsule  3  . sitaGLIPtin (JANUVIA) 100 MG tablet Take 1 tablet (100 mg total) by mouth daily.  90 tablet  3  . Tamsulosin HCl (FLOMAX) 0.4 MG CAPS Take 1 capsule (0.4 mg total) by mouth daily.  90 capsule  3  . warfarin (COUMADIN) 6 MG tablet Take 6-9 mg by mouth daily. Takes 9 mg on Monday, Wednesday, Friday and Saturday and 6 mg all other days      . bromocriptine (PARLODEL) 2.5 MG tablet Take 1 tablet (2.5 mg total) by mouth daily.  90 tablet  3  . fluticasone (FLONASE) 50 MCG/ACT nasal spray Place 2 sprays into the nose daily.  27.5 g  3  . [DISCONTINUED] multivitamin (METANX) 3-35-2 MG TABS tablet Take 1 tablet by  mouth daily.  90 tablet  1    Allergies  Allergen Reactions  . Amoxicillin Anaphylaxis  . Other Other (See Comments)    Muscle Relaxers; "I get very woozy"    Family History  Problem Relation Age of Onset  . Heart disease Mother 62    died  . Breast cancer Mother     sarcoma  . Heart attack Father 40    died  . Heart disease Father     MI  . Diabetes Neg Hx   . COPD Neg Hx     BP 126/80  Pulse 72  Wt 216 lb (97.977 kg)  SpO2 96%   Review of Systems denies hypoglycemia    Objective:   Physical Exam VITAL SIGNS:  See vs page GENERAL: no distress PSYCH: Alert and oriented x 3.  Does not appear anxious nor depressed.   Lab Results  Component Value Date   HGBA1C 7.0* 10/23/2012      Assessment & Plan:  DM: he shoud transition off sulfonylurea if possible

## 2012-10-24 DIAGNOSIS — E1169 Type 2 diabetes mellitus with other specified complication: Secondary | ICD-10-CM | POA: Diagnosis not present

## 2012-10-24 DIAGNOSIS — L97509 Non-pressure chronic ulcer of other part of unspecified foot with unspecified severity: Secondary | ICD-10-CM | POA: Diagnosis not present

## 2012-10-24 DIAGNOSIS — L84 Corns and callosities: Secondary | ICD-10-CM | POA: Diagnosis not present

## 2012-10-25 ENCOUNTER — Other Ambulatory Visit: Payer: Self-pay | Admitting: *Deleted

## 2012-10-25 DIAGNOSIS — L97509 Non-pressure chronic ulcer of other part of unspecified foot with unspecified severity: Secondary | ICD-10-CM | POA: Diagnosis not present

## 2012-10-25 DIAGNOSIS — E1169 Type 2 diabetes mellitus with other specified complication: Secondary | ICD-10-CM | POA: Diagnosis not present

## 2012-10-25 DIAGNOSIS — L84 Corns and callosities: Secondary | ICD-10-CM | POA: Diagnosis not present

## 2012-10-25 LAB — GLUCOSE, CAPILLARY: Glucose-Capillary: 136 mg/dL — ABNORMAL HIGH (ref 70–99)

## 2012-10-25 MED ORDER — FLUTICASONE PROPIONATE 50 MCG/ACT NA SUSP: 2.0000 | Freq: Every day | NASAL | Status: DC

## 2012-10-26 DIAGNOSIS — L84 Corns and callosities: Secondary | ICD-10-CM | POA: Diagnosis not present

## 2012-10-26 DIAGNOSIS — E1169 Type 2 diabetes mellitus with other specified complication: Secondary | ICD-10-CM | POA: Diagnosis not present

## 2012-10-26 DIAGNOSIS — L97509 Non-pressure chronic ulcer of other part of unspecified foot with unspecified severity: Secondary | ICD-10-CM | POA: Diagnosis not present

## 2012-10-26 LAB — GLUCOSE, CAPILLARY
Glucose-Capillary: 150 mg/dL — ABNORMAL HIGH (ref 70–99)
Glucose-Capillary: 149 mg/dL — ABNORMAL HIGH (ref 70–99)

## 2012-10-27 DIAGNOSIS — L84 Corns and callosities: Secondary | ICD-10-CM | POA: Diagnosis not present

## 2012-10-27 DIAGNOSIS — E1169 Type 2 diabetes mellitus with other specified complication: Secondary | ICD-10-CM | POA: Diagnosis not present

## 2012-10-27 DIAGNOSIS — L97509 Non-pressure chronic ulcer of other part of unspecified foot with unspecified severity: Secondary | ICD-10-CM | POA: Diagnosis not present

## 2012-10-27 LAB — GLUCOSE, CAPILLARY: Glucose-Capillary: 138 mg/dL — ABNORMAL HIGH (ref 70–99)

## 2012-10-30 DIAGNOSIS — L84 Corns and callosities: Secondary | ICD-10-CM | POA: Diagnosis not present

## 2012-10-30 DIAGNOSIS — E1169 Type 2 diabetes mellitus with other specified complication: Secondary | ICD-10-CM | POA: Diagnosis not present

## 2012-10-30 DIAGNOSIS — L97509 Non-pressure chronic ulcer of other part of unspecified foot with unspecified severity: Secondary | ICD-10-CM | POA: Diagnosis not present

## 2012-10-30 LAB — GLUCOSE, CAPILLARY
Glucose-Capillary: 174 mg/dL — ABNORMAL HIGH (ref 70–99)
Glucose-Capillary: 160 mg/dL — ABNORMAL HIGH (ref 70–99)

## 2012-10-31 DIAGNOSIS — E1169 Type 2 diabetes mellitus with other specified complication: Secondary | ICD-10-CM | POA: Diagnosis not present

## 2012-10-31 DIAGNOSIS — L84 Corns and callosities: Secondary | ICD-10-CM | POA: Diagnosis not present

## 2012-10-31 DIAGNOSIS — L97509 Non-pressure chronic ulcer of other part of unspecified foot with unspecified severity: Secondary | ICD-10-CM | POA: Diagnosis not present

## 2012-11-01 ENCOUNTER — Ambulatory Visit (INDEPENDENT_AMBULATORY_CARE_PROVIDER_SITE_OTHER): Payer: Medicare Other | Admitting: Cardiovascular Disease

## 2012-11-01 ENCOUNTER — Encounter: Payer: Self-pay | Admitting: Cardiovascular Disease

## 2012-11-01 VITALS — BP 127/63 | Ht 71.0 in | Wt 215.0 lb

## 2012-11-01 DIAGNOSIS — L97509 Non-pressure chronic ulcer of other part of unspecified foot with unspecified severity: Secondary | ICD-10-CM | POA: Diagnosis not present

## 2012-11-01 DIAGNOSIS — I25810 Atherosclerosis of coronary artery bypass graft(s) without angina pectoris: Secondary | ICD-10-CM

## 2012-11-01 DIAGNOSIS — L84 Corns and callosities: Secondary | ICD-10-CM | POA: Diagnosis not present

## 2012-11-01 DIAGNOSIS — E1169 Type 2 diabetes mellitus with other specified complication: Secondary | ICD-10-CM | POA: Diagnosis not present

## 2012-11-01 LAB — GLUCOSE, CAPILLARY: Glucose-Capillary: 149 mg/dL — ABNORMAL HIGH (ref 70–99)

## 2012-11-01 NOTE — Patient Instructions (Addendum)
Your physician wants you to follow-up in:  6 months. You will receive a reminder letter in the mail two months in advance. If you don't receive a letter, please call our office to schedule the follow-up appointment.   

## 2012-11-01 NOTE — Progress Notes (Signed)
HPI:  78 year old gentleman presenting for followup evaluation. The patient has coronary artery disease status post CABG. Other cardiac problems include atrial fibrillation, hypertension, carotid stenosis, and hyperlipidemia. The patient has tolerated chronic anticoagulation with warfarin. He has not had bleeding problems. His biggest problem of late has been related to a nonhealing diabetic foot ulcer. His left foot has been in a boot and he has been unable to exercise now for several months.  Last summer the patient was noted to have a change in his EKG. He had an age-indeterminate anterolateral infarction pattern and a stress Myoview scan demonstrated a small apical scar with mild anterolateral ischemia. LVEF was reduced on the nuclear scan but an echocardiogram showed normal LV function. After careful discussion we elected to proceed with cardiac catheterization and that result as outlined below. Essentially the LIMA to LAD, free RIMA to acute marginal of the RCA, and saphenous vein graft to diagonal were all patent. The saphenous vein graft to distal right coronary artery was occluded and there was moderately severe stenosis of the native left circumflex. Considering his asymptomatic status, I recommended medical therapy.  The patient denies chest pain, chest pressure, palpitations, lightheadedness, dyspnea, or syncope. He's been frustrated by his inability to exercise because of his left foot problems.  Outpatient Encounter Prescriptions as of 11/01/2012  Medication Sig Dispense Refill  . aspirin 81 MG tablet Take 81 mg by mouth daily.        Marland Kitchen atorvastatin (LIPITOR) 10 MG tablet Take 1 tablet (10 mg total) by mouth daily.  90 tablet  3  . bromocriptine (PARLODEL) 2.5 MG tablet Take 1 tablet (2.5 mg total) by mouth daily.  90 tablet  3  . cephALEXin (KEFLEX) 500 MG capsule       . ciprofloxacin (CIPRO) 500 MG tablet       . doxycycline (DORYX) 100 MG DR capsule Take 100 mg by mouth 2 (two)  times daily.      . felodipine (PLENDIL) 5 MG 24 hr tablet TAKE 1 TABLET DAILY  90 tablet  3  . fluticasone (FLONASE) 50 MCG/ACT nasal spray Place 2 sprays into the nose daily.  27.5 g  3  . fluticasone (VERAMYST) 27.5 MCG/SPRAY nasal spray Place 2 sprays into the nose daily.  10 g  3  . glucose blood (ONE TOUCH ULTRA TEST) test strip 1 each by Other route daily. And lancets 1/day 250.00  100 each  12  . IODOSORB 0.9 % gel       . L-Methylfolate-Algae-B12-B6 (METANX) 3-90.314-2-35 MG CAPS TAKE 1 CAPSULE DAILY  90 capsule  3  . Lancets (ONETOUCH ULTRASOFT) lancets       . losartan (COZAAR) 50 MG tablet TAKE 1 TABLET TWICE A DAY  180 tablet  0  . metformin (FORTAMET) 1000 MG (OSM) 24 hr tablet TAKE 1 TABLET TWICE A DAY WITH MEALS  180 tablet  0  . metoprolol succinate (TOPROL-XL) 25 MG 24 hr tablet TAKE ONE-HALF (1/2) TABLET (12.5 MG TOTAL) DAILY  45 tablet  3  . montelukast (SINGULAIR) 10 MG tablet Take 1 tablet (10 mg total) by mouth at bedtime.  90 tablet  1  . pregabalin (LYRICA) 75 MG capsule Take 1 capsule (75 mg total) by mouth 2 (two) times daily.  180 capsule  3  . sitaGLIPtin (JANUVIA) 100 MG tablet Take 1 tablet (100 mg total) by mouth daily.  90 tablet  3  . Tamsulosin HCl (FLOMAX) 0.4 MG CAPS Take 1  capsule (0.4 mg total) by mouth daily.  90 capsule  3  . warfarin (COUMADIN) 6 MG tablet Take 6-9 mg by mouth daily. Takes 9 mg on Monday, Wednesday, Friday and Saturday and 6 mg all other days        Allergies  Allergen Reactions  . Amoxicillin Anaphylaxis  . Other Other (See Comments)    Muscle Relaxers; "I get very woozy"    Past Medical History  Diagnosis Date  . Hypertension   . Hyperlipidemia   . Coronary artery disease     MI '99  . Peripheral neuropathy   . Macular degeneration     has had intra-orbital injections. Has had laser treatment  . Prostate cancer     XRT 45 tx  . CLL (chronic lymphoblastic leukemia)   . Atrial fibrillation 2006    started 2006  . Type  II diabetes mellitus   . Hepatitis C infection 1990's    ROS: Negative except as per HPI  BP 127/63  Ht 5\' 11"  (1.803 m)  Wt 97.523 kg (215 lb)  BMI 29.99 kg/m2  PHYSICAL EXAM: Pt is alert and oriented, pleasant overweight male in NAD HEENT: normal Neck: JVP - normal, carotids 2+= without bruits Lungs: CTA bilaterally CV: RRR without murmur or gallop Abd: soft, NT, Positive BS, no hepatomegaly Ext: Trace edema left ankle, distal pulses intact and equal. The left foot is in a soft boot. Skin: warm/dry no rash  Cardiac Cath (08/29/2012) Coronary angiography:  Coronary dominance: right  Left mainstem: Heavily calcified. 30% distal left main stenosis.  Left anterior descending (LAD): Total occlusion in the proximal vessel. Heavy calcification is present.  Left circumflex (LCx): There is a large intermediate branch that divides into twin vessels. There is no significant stenosis present. The AV groove circumflex is calcified. There is a long segment 80% stenosis in the proximal circumflex. This extends into the first obtuse marginal branch.  Right coronary artery (RCA): Heavily calcified. Totally occluded in the proximal segment. The PDA and posterolateral branch fill from left to right collaterals supplied by the distal AV groove circumflex.  Saphenous vein graft to first diagonal: Widely patent throughout. There is no significant stenosis. The diagonal anastomotic site is widely patent. This graft also retrograde fills the LAD which shows no significant stenosis beyond the graft insertion site.  Saphenous vein graft to distal RCA: Severe degenerative disease noted. Totally occluded in the mid body of the graft.  Free RIMA to acute marginal branch of the RCA: It was imaged subselectively, patent to the acute marginal branch. This graft is in best seen filling on injections of the saphenous vein graft to RCA.  LIMA to LAD: Widely patent throughout. Competitive filling at the anastomotic  site is noted.  Left ventriculography: there is severe hypokinesis of the basal inferior wall. The anterolateral wall is hypokinetic. The estimated left ventricular ejection fraction is 40%. I do not appreciate any significant mitral regurgitation.  Final Conclusions:  1. Severe native three-vessel coronary artery disease  2. Status post aortocoronary bypass surgery with continued patency of the LIMA to LAD, free RIMA to acute marginal branch of the RCA, and saphenous vein graft to diagonal.  3. Total occlusion of the saphenous vein graft to distal right coronary artery.  4. Moderate segmental left ventricular systolic dysfunction.  Recommendations: The patient has significant stenosis of the native LCx which is not grafted. I do not see any other targets for revascularization. Considering his good functional status and minimal  symptoms (essentially no angina), I would recommend continued medical therapy.  Tonny Bollman  08/29/2012, 9:40 AM  ASSESSMENT AND PLAN: 1. Coronary artery disease status post CABG. The patient remains free of angina. We'll continue his current medical program. We discussed the importance of lifestyle modification at length. I would really like to see him lose some weight. His type 2 diabetes has been more difficult to control and he understands that he may be able to reduce some of his medication with significant weight loss.  2. Hypertension. Blood pressure is controlled on losartan and long-acting metoprolol.  3. Paroxysmal atrial fibrillation. He has been in sinus rhythm since I have been following him over the past few years. He remains anticoagulated with warfarin.  4. Hyperlipidemia. Lipids one year ago showed a cholesterol of 142, HDL 51, and LDL 60. He remains on low-dose atorvastatin with an excellent lipid panel.  For followup, I would like to see him back in 6 months.  Tonny Bollman 11/01/2012 1:58 PM

## 2012-11-02 DIAGNOSIS — E1169 Type 2 diabetes mellitus with other specified complication: Secondary | ICD-10-CM | POA: Diagnosis not present

## 2012-11-02 DIAGNOSIS — L97509 Non-pressure chronic ulcer of other part of unspecified foot with unspecified severity: Secondary | ICD-10-CM | POA: Diagnosis not present

## 2012-11-02 DIAGNOSIS — L84 Corns and callosities: Secondary | ICD-10-CM | POA: Diagnosis not present

## 2012-11-06 ENCOUNTER — Ambulatory Visit (INDEPENDENT_AMBULATORY_CARE_PROVIDER_SITE_OTHER): Payer: Medicare Other | Admitting: Pharmacist

## 2012-11-06 DIAGNOSIS — IMO0002 Reserved for concepts with insufficient information to code with codable children: Secondary | ICD-10-CM

## 2012-11-06 DIAGNOSIS — Z7901 Long term (current) use of anticoagulants: Secondary | ICD-10-CM

## 2012-11-06 DIAGNOSIS — E1169 Type 2 diabetes mellitus with other specified complication: Secondary | ICD-10-CM | POA: Diagnosis not present

## 2012-11-06 DIAGNOSIS — L97509 Non-pressure chronic ulcer of other part of unspecified foot with unspecified severity: Secondary | ICD-10-CM | POA: Diagnosis not present

## 2012-11-06 DIAGNOSIS — L84 Corns and callosities: Secondary | ICD-10-CM | POA: Diagnosis not present

## 2012-11-06 LAB — POCT INR: INR: 1.8

## 2012-11-06 LAB — GLUCOSE, CAPILLARY: Glucose-Capillary: 154 mg/dL — ABNORMAL HIGH (ref 70–99)

## 2012-11-07 DIAGNOSIS — L97509 Non-pressure chronic ulcer of other part of unspecified foot with unspecified severity: Secondary | ICD-10-CM | POA: Diagnosis not present

## 2012-11-07 DIAGNOSIS — E1169 Type 2 diabetes mellitus with other specified complication: Secondary | ICD-10-CM | POA: Diagnosis not present

## 2012-11-07 DIAGNOSIS — L84 Corns and callosities: Secondary | ICD-10-CM | POA: Diagnosis not present

## 2012-11-07 LAB — GLUCOSE, CAPILLARY: Glucose-Capillary: 149 mg/dL — ABNORMAL HIGH (ref 70–99)

## 2012-11-09 DIAGNOSIS — E1169 Type 2 diabetes mellitus with other specified complication: Secondary | ICD-10-CM | POA: Diagnosis not present

## 2012-11-09 DIAGNOSIS — L97509 Non-pressure chronic ulcer of other part of unspecified foot with unspecified severity: Secondary | ICD-10-CM | POA: Diagnosis not present

## 2012-11-09 DIAGNOSIS — L84 Corns and callosities: Secondary | ICD-10-CM | POA: Diagnosis not present

## 2012-11-10 DIAGNOSIS — L97509 Non-pressure chronic ulcer of other part of unspecified foot with unspecified severity: Secondary | ICD-10-CM | POA: Diagnosis not present

## 2012-11-10 DIAGNOSIS — E1169 Type 2 diabetes mellitus with other specified complication: Secondary | ICD-10-CM | POA: Diagnosis not present

## 2012-11-10 DIAGNOSIS — L84 Corns and callosities: Secondary | ICD-10-CM | POA: Diagnosis not present

## 2012-11-10 LAB — GLUCOSE, CAPILLARY: Glucose-Capillary: 123 mg/dL — ABNORMAL HIGH (ref 70–99)

## 2012-11-13 ENCOUNTER — Encounter (HOSPITAL_BASED_OUTPATIENT_CLINIC_OR_DEPARTMENT_OTHER): Payer: Medicare Other | Attending: General Surgery

## 2012-11-13 DIAGNOSIS — E1169 Type 2 diabetes mellitus with other specified complication: Secondary | ICD-10-CM | POA: Insufficient documentation

## 2012-11-13 DIAGNOSIS — L97409 Non-pressure chronic ulcer of unspecified heel and midfoot with unspecified severity: Secondary | ICD-10-CM | POA: Insufficient documentation

## 2012-11-13 LAB — GLUCOSE, CAPILLARY: Glucose-Capillary: 170 mg/dL — ABNORMAL HIGH (ref 70–99)

## 2012-11-14 DIAGNOSIS — L97409 Non-pressure chronic ulcer of unspecified heel and midfoot with unspecified severity: Secondary | ICD-10-CM | POA: Diagnosis not present

## 2012-11-14 DIAGNOSIS — E1169 Type 2 diabetes mellitus with other specified complication: Secondary | ICD-10-CM | POA: Diagnosis not present

## 2012-11-14 NOTE — Progress Notes (Signed)
[facility]  NAME:  [name], [name] NO.:  [id number]  MEDICAL RECORD NO.:  [id number]      DATE OF BIRTH:  1935/04/16  PHYSICIAN:  Ardath Sax, M.D.      VISIT DATE:  11/13/2012                                  OFFICE VISIT   Ralph Gray is a 78 year old gentleman who is a diabetic and has a diabetic foot ulcer on his left heel.  It has been graded as a Wagner 3 diabetic foot ulcer.  We have been treating it with antibiotics and offloading for the last month.  He has been getting hyperbaric oxygen treatments.  I feel that the hyperbaric oxygen treatments have been the primary reason for the marked improvement his ulcer has made, and I would like to put him on for another series of hyperbaric oxygen treatments as I think this will heal over this diabetic foot ulcer.     Ardath Sax, M.D.     PP/MEDQ  D:  11/13/2012  T:  11/14/2012  Job:  [id number]  cc:   [employer]

## 2012-11-15 DIAGNOSIS — L97409 Non-pressure chronic ulcer of unspecified heel and midfoot with unspecified severity: Secondary | ICD-10-CM | POA: Diagnosis not present

## 2012-11-15 DIAGNOSIS — E1169 Type 2 diabetes mellitus with other specified complication: Secondary | ICD-10-CM | POA: Diagnosis not present

## 2012-11-15 LAB — GLUCOSE, CAPILLARY
Glucose-Capillary: 169 mg/dL — ABNORMAL HIGH (ref 70–99)
Glucose-Capillary: 128 mg/dL — ABNORMAL HIGH (ref 70–99)

## 2012-11-16 ENCOUNTER — Encounter (HOSPITAL_BASED_OUTPATIENT_CLINIC_OR_DEPARTMENT_OTHER): Payer: Medicare Other

## 2012-11-16 DIAGNOSIS — E1169 Type 2 diabetes mellitus with other specified complication: Secondary | ICD-10-CM | POA: Diagnosis not present

## 2012-11-16 DIAGNOSIS — L97409 Non-pressure chronic ulcer of unspecified heel and midfoot with unspecified severity: Secondary | ICD-10-CM | POA: Diagnosis not present

## 2012-11-16 LAB — GLUCOSE, CAPILLARY: Glucose-Capillary: 136 mg/dL — ABNORMAL HIGH (ref 70–99)

## 2012-11-17 DIAGNOSIS — L97409 Non-pressure chronic ulcer of unspecified heel and midfoot with unspecified severity: Secondary | ICD-10-CM | POA: Diagnosis not present

## 2012-11-17 DIAGNOSIS — E1169 Type 2 diabetes mellitus with other specified complication: Secondary | ICD-10-CM | POA: Diagnosis not present

## 2012-11-20 ENCOUNTER — Other Ambulatory Visit: Payer: Self-pay | Admitting: *Deleted

## 2012-11-20 DIAGNOSIS — E1169 Type 2 diabetes mellitus with other specified complication: Secondary | ICD-10-CM | POA: Diagnosis not present

## 2012-11-20 DIAGNOSIS — L97409 Non-pressure chronic ulcer of unspecified heel and midfoot with unspecified severity: Secondary | ICD-10-CM | POA: Diagnosis not present

## 2012-11-20 LAB — GLUCOSE, CAPILLARY
Glucose-Capillary: 173 mg/dL — ABNORMAL HIGH (ref 70–99)
Glucose-Capillary: 153 mg/dL — ABNORMAL HIGH (ref 70–99)

## 2012-11-21 DIAGNOSIS — E1169 Type 2 diabetes mellitus with other specified complication: Secondary | ICD-10-CM | POA: Diagnosis not present

## 2012-11-21 DIAGNOSIS — L97409 Non-pressure chronic ulcer of unspecified heel and midfoot with unspecified severity: Secondary | ICD-10-CM | POA: Diagnosis not present

## 2012-11-21 LAB — GLUCOSE, CAPILLARY: Glucose-Capillary: 164 mg/dL — ABNORMAL HIGH (ref 70–99)

## 2012-11-21 MED ORDER — WARFARIN SODIUM 6 MG PO TABS: ORAL_TABLET | ORAL | Status: DC

## 2012-11-22 DIAGNOSIS — L97409 Non-pressure chronic ulcer of unspecified heel and midfoot with unspecified severity: Secondary | ICD-10-CM | POA: Diagnosis not present

## 2012-11-22 DIAGNOSIS — E1169 Type 2 diabetes mellitus with other specified complication: Secondary | ICD-10-CM | POA: Diagnosis not present

## 2012-11-22 LAB — GLUCOSE, CAPILLARY
Glucose-Capillary: 177 mg/dL — ABNORMAL HIGH (ref 70–99)
Glucose-Capillary: 188 mg/dL — ABNORMAL HIGH (ref 70–99)

## 2012-11-27 ENCOUNTER — Telehealth: Payer: Self-pay | Admitting: Internal Medicine

## 2012-11-27 DIAGNOSIS — H6690 Otitis media, unspecified, unspecified ear: Secondary | ICD-10-CM | POA: Diagnosis not present

## 2012-11-27 DIAGNOSIS — H65199 Other acute nonsuppurative otitis media, unspecified ear: Secondary | ICD-10-CM | POA: Diagnosis not present

## 2012-11-27 DIAGNOSIS — H6980 Other specified disorders of Eustachian tube, unspecified ear: Secondary | ICD-10-CM | POA: Diagnosis not present

## 2012-11-27 NOTE — Telephone Encounter (Signed)
Forward  4 pages from [facility] to Dr. Illene Regulus for review on 11-27-12 ym

## 2012-12-01 ENCOUNTER — Telehealth: Payer: Self-pay | Admitting: Internal Medicine

## 2012-12-01 DIAGNOSIS — H6120 Impacted cerumen, unspecified ear: Secondary | ICD-10-CM | POA: Diagnosis not present

## 2012-12-01 NOTE — Telephone Encounter (Signed)
Forward 5 pages form [facility] to Dr. Illene Regulus for review on 12-01-12 ym

## 2012-12-04 ENCOUNTER — Other Ambulatory Visit: Payer: Self-pay | Admitting: Internal Medicine

## 2012-12-04 ENCOUNTER — Ambulatory Visit (INDEPENDENT_AMBULATORY_CARE_PROVIDER_SITE_OTHER): Payer: Medicare Other

## 2012-12-04 DIAGNOSIS — Z7901 Long term (current) use of anticoagulants: Secondary | ICD-10-CM | POA: Diagnosis not present

## 2012-12-04 DIAGNOSIS — IMO0002 Reserved for concepts with insufficient information to code with codable children: Secondary | ICD-10-CM

## 2012-12-04 DIAGNOSIS — E1169 Type 2 diabetes mellitus with other specified complication: Secondary | ICD-10-CM | POA: Diagnosis not present

## 2012-12-04 DIAGNOSIS — L97409 Non-pressure chronic ulcer of unspecified heel and midfoot with unspecified severity: Secondary | ICD-10-CM | POA: Diagnosis not present

## 2012-12-04 LAB — POCT INR: INR: 2.5

## 2012-12-04 IMAGING — CR DG FOOT COMPLETE 3+V*L*
3 series · 3 of 3 positions shown · non-contrast
Comparison: MRI 06/28/2012

CLINICAL DATA: Foot wound

LEFT FOOT - COMPLETE 3+ VIEW

[t foot ap left]
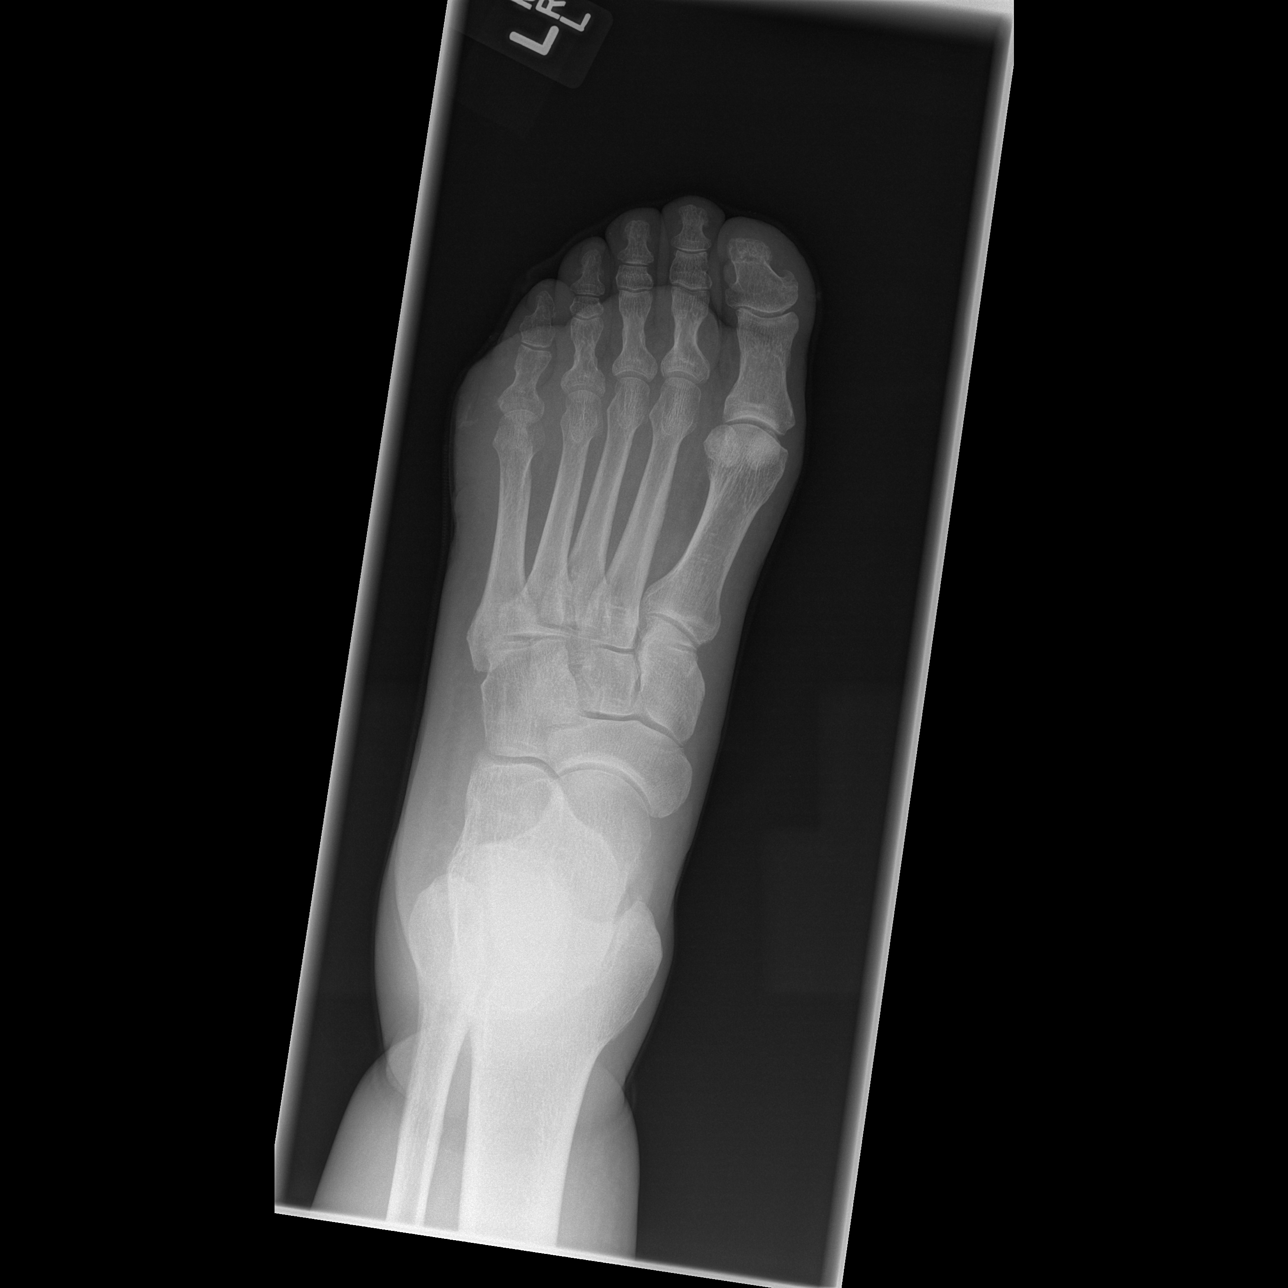

[t foot oblique left]
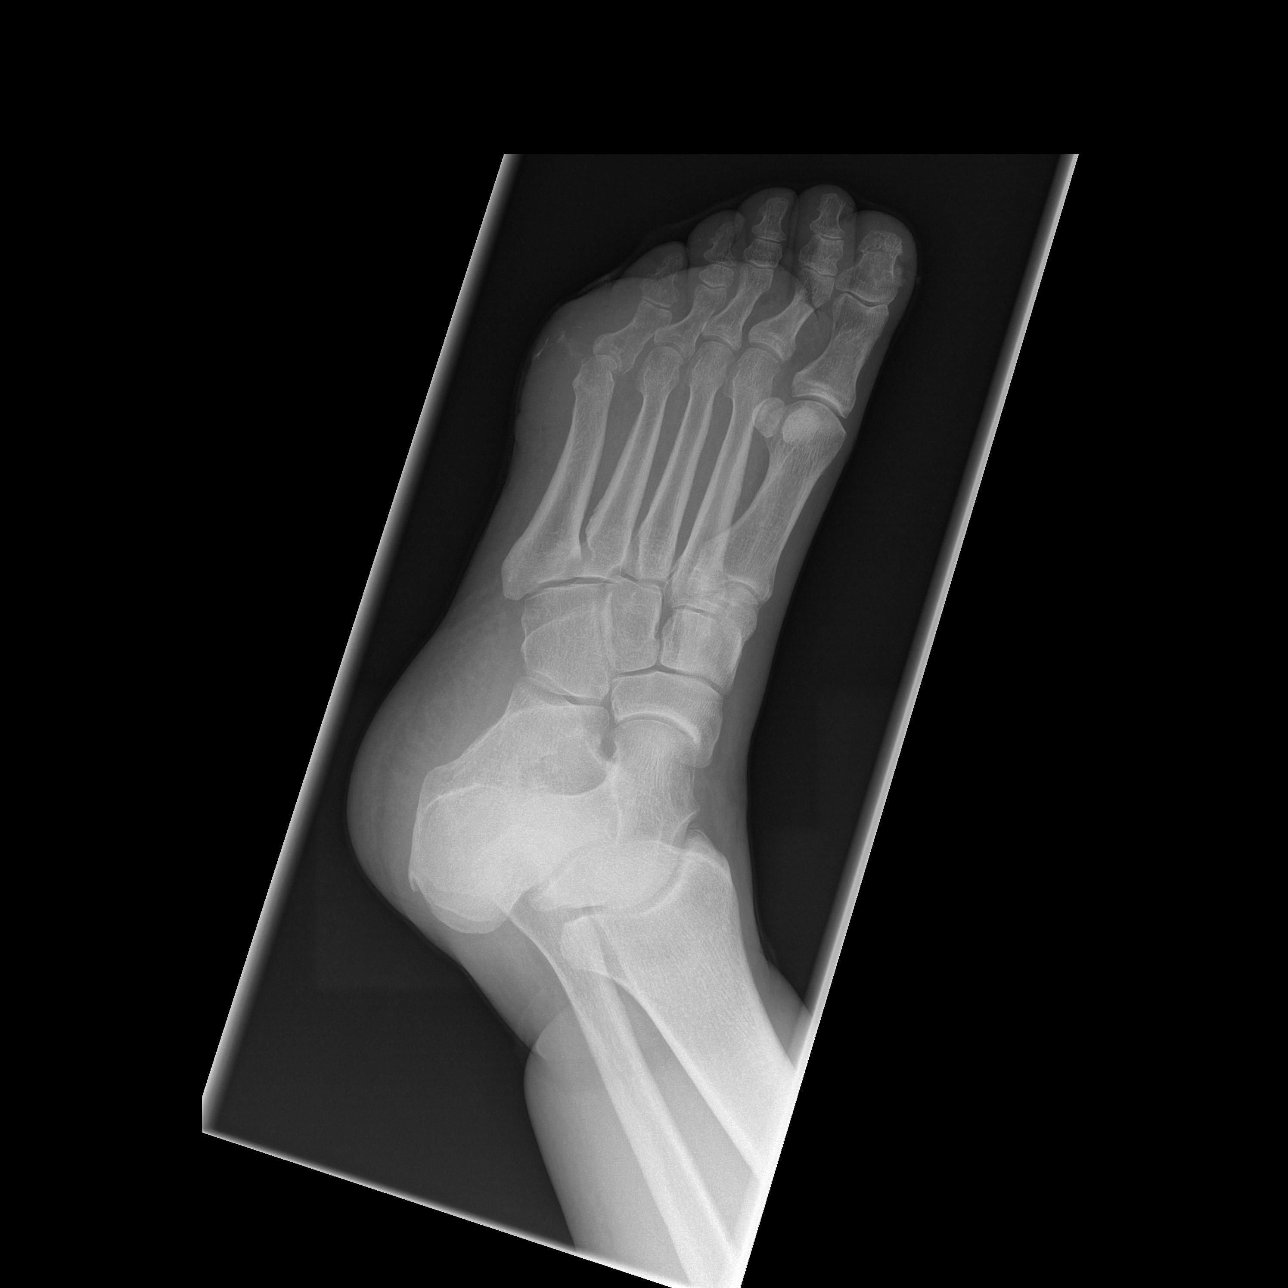

[t foot lat left]
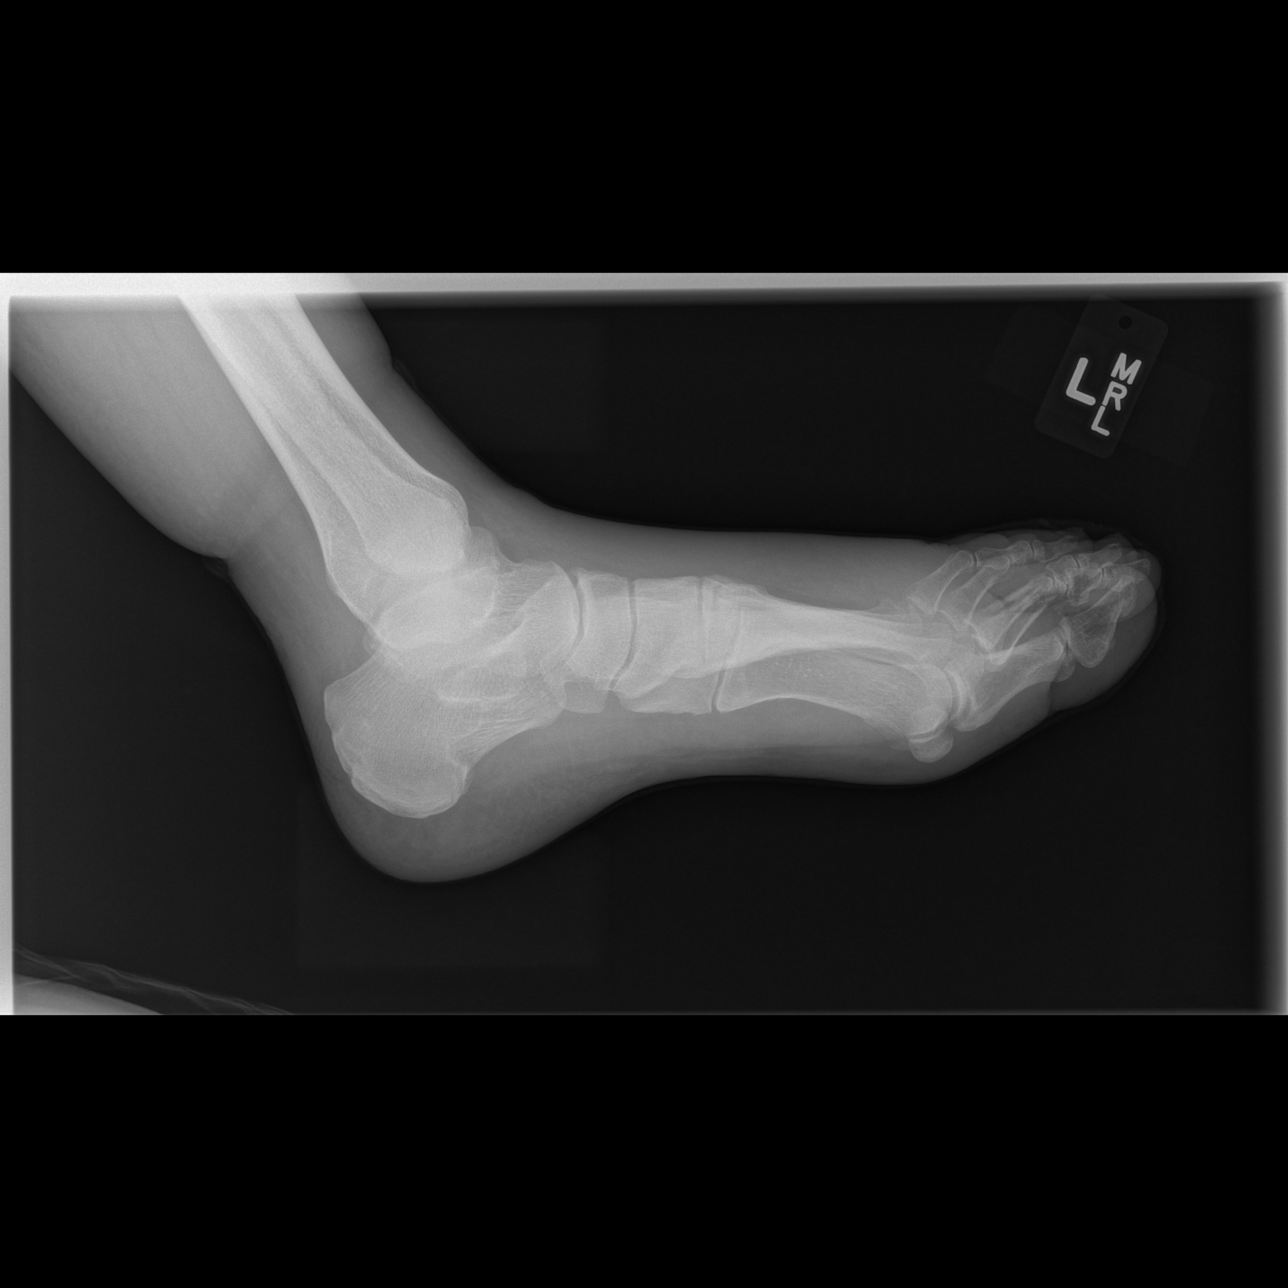

[3 of 3 positions shown; findings below may reference images not displayed]

FINDINGS: Marked soft tissue swelling in the dorsum of the forefoot
is noted.  There is also soft tissue swelling lateral to the fifth
metatarsophalangeal joint associated with nonspecific radio-opaque
linear opacities.  No acute fracture.  No dislocation.  No bony
destruction. Mild degenerative changes are noted.
IMPRESSION: Soft tissue swelling. Soft tissue swelling lateral to the fifth
metatarsophalangeal joint contains a linear radiopaque densities.
Foreign body is not excluded.

No acute bony pathology.

## 2012-12-04 NOTE — Telephone Encounter (Signed)
Med filled.  

## 2012-12-05 ENCOUNTER — Telehealth: Payer: Self-pay | Admitting: Internal Medicine

## 2012-12-05 DIAGNOSIS — H6980 Other specified disorders of Eustachian tube, unspecified ear: Secondary | ICD-10-CM | POA: Diagnosis not present

## 2012-12-05 DIAGNOSIS — H9210 Otorrhea, unspecified ear: Secondary | ICD-10-CM | POA: Diagnosis not present

## 2012-12-05 DIAGNOSIS — J310 Chronic rhinitis: Secondary | ICD-10-CM | POA: Diagnosis not present

## 2012-12-05 DIAGNOSIS — J3489 Other specified disorders of nose and nasal sinuses: Secondary | ICD-10-CM | POA: Diagnosis not present

## 2012-12-05 NOTE — Telephone Encounter (Signed)
Received 5 pages from [facility], sent to Dr. Debby Bud. 12/05/12/sd

## 2012-12-05 NOTE — Telephone Encounter (Signed)
Forward  5 pages from [facility] to Dr. Illene Regulus for review on 12-05-12 ym

## 2012-12-06 DIAGNOSIS — E1169 Type 2 diabetes mellitus with other specified complication: Secondary | ICD-10-CM | POA: Diagnosis not present

## 2012-12-06 DIAGNOSIS — L97409 Non-pressure chronic ulcer of unspecified heel and midfoot with unspecified severity: Secondary | ICD-10-CM | POA: Diagnosis not present

## 2012-12-06 LAB — GLUCOSE, CAPILLARY
Glucose-Capillary: 183 mg/dL — ABNORMAL HIGH (ref 70–99)
Glucose-Capillary: 164 mg/dL — ABNORMAL HIGH (ref 70–99)

## 2012-12-11 ENCOUNTER — Encounter (HOSPITAL_BASED_OUTPATIENT_CLINIC_OR_DEPARTMENT_OTHER): Payer: Medicare Other | Attending: General Surgery

## 2012-12-11 DIAGNOSIS — L97509 Non-pressure chronic ulcer of other part of unspecified foot with unspecified severity: Secondary | ICD-10-CM | POA: Diagnosis not present

## 2012-12-11 DIAGNOSIS — E1169 Type 2 diabetes mellitus with other specified complication: Secondary | ICD-10-CM | POA: Insufficient documentation

## 2012-12-12 LAB — GLUCOSE, CAPILLARY: Glucose-Capillary: 179 mg/dL — ABNORMAL HIGH (ref 70–99)

## 2012-12-13 ENCOUNTER — Telehealth: Payer: Self-pay | Admitting: Internal Medicine

## 2012-12-13 DIAGNOSIS — E1169 Type 2 diabetes mellitus with other specified complication: Secondary | ICD-10-CM | POA: Diagnosis not present

## 2012-12-13 DIAGNOSIS — L97509 Non-pressure chronic ulcer of other part of unspecified foot with unspecified severity: Secondary | ICD-10-CM | POA: Diagnosis not present

## 2012-12-13 LAB — GLUCOSE, CAPILLARY: Glucose-Capillary: 189 mg/dL — ABNORMAL HIGH (ref 70–99)

## 2012-12-13 NOTE — Telephone Encounter (Signed)
Rec'd from [facility] forward 5 pages to Dr. Norins °

## 2012-12-14 DIAGNOSIS — L97509 Non-pressure chronic ulcer of other part of unspecified foot with unspecified severity: Secondary | ICD-10-CM | POA: Diagnosis not present

## 2012-12-14 DIAGNOSIS — E1169 Type 2 diabetes mellitus with other specified complication: Secondary | ICD-10-CM | POA: Diagnosis not present

## 2012-12-18 DIAGNOSIS — H6980 Other specified disorders of Eustachian tube, unspecified ear: Secondary | ICD-10-CM | POA: Diagnosis not present

## 2012-12-18 DIAGNOSIS — E1169 Type 2 diabetes mellitus with other specified complication: Secondary | ICD-10-CM | POA: Diagnosis not present

## 2012-12-18 DIAGNOSIS — H9209 Otalgia, unspecified ear: Secondary | ICD-10-CM | POA: Diagnosis not present

## 2012-12-18 DIAGNOSIS — R0982 Postnasal drip: Secondary | ICD-10-CM | POA: Diagnosis not present

## 2012-12-18 DIAGNOSIS — L97509 Non-pressure chronic ulcer of other part of unspecified foot with unspecified severity: Secondary | ICD-10-CM | POA: Diagnosis not present

## 2012-12-18 DIAGNOSIS — H905 Unspecified sensorineural hearing loss: Secondary | ICD-10-CM | POA: Diagnosis not present

## 2012-12-18 DIAGNOSIS — H906 Mixed conductive and sensorineural hearing loss, bilateral: Secondary | ICD-10-CM | POA: Diagnosis not present

## 2012-12-18 LAB — GLUCOSE, CAPILLARY: Glucose-Capillary: 173 mg/dL — ABNORMAL HIGH (ref 70–99)

## 2012-12-19 DIAGNOSIS — H43819 Vitreous degeneration, unspecified eye: Secondary | ICD-10-CM | POA: Diagnosis not present

## 2012-12-19 DIAGNOSIS — H35349 Macular cyst, hole, or pseudohole, unspecified eye: Secondary | ICD-10-CM | POA: Diagnosis not present

## 2012-12-19 DIAGNOSIS — E1139 Type 2 diabetes mellitus with other diabetic ophthalmic complication: Secondary | ICD-10-CM | POA: Diagnosis not present

## 2012-12-19 DIAGNOSIS — H353290 Exudative age-related macular degeneration, unspecified eye, stage unspecified: Secondary | ICD-10-CM | POA: Diagnosis not present

## 2012-12-20 DIAGNOSIS — L97509 Non-pressure chronic ulcer of other part of unspecified foot with unspecified severity: Secondary | ICD-10-CM | POA: Diagnosis not present

## 2012-12-20 DIAGNOSIS — E1169 Type 2 diabetes mellitus with other specified complication: Secondary | ICD-10-CM | POA: Diagnosis not present

## 2012-12-20 LAB — GLUCOSE, CAPILLARY: Glucose-Capillary: 157 mg/dL — ABNORMAL HIGH (ref 70–99)

## 2012-12-21 ENCOUNTER — Telehealth: Payer: Self-pay | Admitting: Internal Medicine

## 2012-12-21 DIAGNOSIS — L97509 Non-pressure chronic ulcer of other part of unspecified foot with unspecified severity: Secondary | ICD-10-CM | POA: Diagnosis not present

## 2012-12-21 DIAGNOSIS — E1169 Type 2 diabetes mellitus with other specified complication: Secondary | ICD-10-CM | POA: Diagnosis not present

## 2012-12-21 LAB — GLUCOSE, CAPILLARY
Glucose-Capillary: 188 mg/dL — ABNORMAL HIGH (ref 70–99)
Glucose-Capillary: 161 mg/dL — ABNORMAL HIGH (ref 70–99)

## 2012-12-21 NOTE — Telephone Encounter (Signed)
rec'd from [facility] forward 5 pages to Dr. Debby Bud 3//13/14

## 2012-12-22 DIAGNOSIS — E1169 Type 2 diabetes mellitus with other specified complication: Secondary | ICD-10-CM | POA: Diagnosis not present

## 2012-12-22 DIAGNOSIS — L97509 Non-pressure chronic ulcer of other part of unspecified foot with unspecified severity: Secondary | ICD-10-CM | POA: Diagnosis not present

## 2012-12-25 DIAGNOSIS — B351 Tinea unguium: Secondary | ICD-10-CM | POA: Diagnosis not present

## 2012-12-25 DIAGNOSIS — Q828 Other specified congenital malformations of skin: Secondary | ICD-10-CM | POA: Diagnosis not present

## 2012-12-27 DIAGNOSIS — L97509 Non-pressure chronic ulcer of other part of unspecified foot with unspecified severity: Secondary | ICD-10-CM | POA: Diagnosis not present

## 2012-12-27 DIAGNOSIS — E1169 Type 2 diabetes mellitus with other specified complication: Secondary | ICD-10-CM | POA: Diagnosis not present

## 2013-01-08 ENCOUNTER — Ambulatory Visit (INDEPENDENT_AMBULATORY_CARE_PROVIDER_SITE_OTHER): Payer: Medicare Other | Admitting: Pharmacist

## 2013-01-08 DIAGNOSIS — Z7901 Long term (current) use of anticoagulants: Secondary | ICD-10-CM

## 2013-01-08 DIAGNOSIS — IMO0002 Reserved for concepts with insufficient information to code with codable children: Secondary | ICD-10-CM

## 2013-01-15 DIAGNOSIS — E119 Type 2 diabetes mellitus without complications: Secondary | ICD-10-CM | POA: Diagnosis not present

## 2013-01-15 DIAGNOSIS — H43819 Vitreous degeneration, unspecified eye: Secondary | ICD-10-CM | POA: Diagnosis not present

## 2013-01-15 DIAGNOSIS — H35079 Retinal telangiectasis, unspecified eye: Secondary | ICD-10-CM | POA: Diagnosis not present

## 2013-01-15 DIAGNOSIS — H35349 Macular cyst, hole, or pseudohole, unspecified eye: Secondary | ICD-10-CM | POA: Diagnosis not present

## 2013-01-29 ENCOUNTER — Encounter: Payer: Self-pay | Admitting: Cardiovascular Disease

## 2013-01-29 ENCOUNTER — Encounter: Payer: Self-pay | Admitting: Endocrinology

## 2013-01-29 ENCOUNTER — Ambulatory Visit (INDEPENDENT_AMBULATORY_CARE_PROVIDER_SITE_OTHER): Payer: Medicare Other | Admitting: Cardiovascular Disease

## 2013-01-29 ENCOUNTER — Ambulatory Visit (INDEPENDENT_AMBULATORY_CARE_PROVIDER_SITE_OTHER): Payer: Medicare Other | Admitting: Endocrinology

## 2013-01-29 VITALS — BP 132/80 | HR 59 | Wt 215.0 lb

## 2013-01-29 VITALS — BP 134/60 | HR 60 | Ht 71.0 in | Wt 218.0 lb

## 2013-01-29 DIAGNOSIS — E1149 Type 2 diabetes mellitus with other diabetic neurological complication: Secondary | ICD-10-CM

## 2013-01-29 DIAGNOSIS — E1151 Type 2 diabetes mellitus with diabetic peripheral angiopathy without gangrene: Secondary | ICD-10-CM | POA: Insufficient documentation

## 2013-01-29 DIAGNOSIS — E119 Type 2 diabetes mellitus without complications: Secondary | ICD-10-CM | POA: Diagnosis not present

## 2013-01-29 DIAGNOSIS — I25810 Atherosclerosis of coronary artery bypass graft(s) without angina pectoris: Secondary | ICD-10-CM

## 2013-01-29 NOTE — Patient Instructions (Addendum)
Your physician wants you to follow-up in: 6 MONTHS with Dr Excell Seltzer.  You will receive a reminder letter in the mail two months in advance. If you don't receive a letter, please call our office to schedule the follow-up appointment.  Your physician recommends that you continue on your current medications as directed. Please refer to the Current Medication list given to you today.  Your physician has requested that you have a carotid duplex in 6 MONTHS. This test is an ultrasound of the carotid arteries in your neck. It looks at blood flow through these arteries that supply the brain with blood. Allow one hour for this exam. There are no restrictions or special instructions.  Your physician recommends that you return for a FASTING LIPID and LIVER profile in 6 MONTHS--nothing to eat or drink after midnight, lab opens at 7:30

## 2013-01-29 NOTE — Progress Notes (Signed)
HPI:  78 year old gentleman presenting for followup evaluation. The patient has coronary artery disease status post CABG. He's also followed for atrial fibrillation, hypertension, carotid stenosis, and hyperlipidemia. Cardiac catheterization in 2013 demonstrated continued patency of the LIMA to LAD, freely 2 acute marginal branch of the RCA, and saphenous vein graft to diagonal. The saphenous vein graft to distal RCA was occluded and there was moderately severe stenosis of the native left circumflex. Medical therapy was recommended. Lipids in 2013 showed a cholesterol of 142, triglycerides 156, HDL 51, and LDL 60.  He has struggled for some time now with a diabetic foot ulcer. He's undergone extensive treatment at the wound care center with hyperbaric oxygen. His foot has finally healed he's been able to get back to regular exercise. He's been going to the gym regularly and denies exertional chest pain or pressure. He denies dyspnea, leg swelling, or palpitations.  He saw Dr. Everardo All with endocrinology this morning. His hemoglobin A1c was 7.3. His weight is up a few more pounds from his last visit here.  He is most concerned about progressive vision problems. He's been to see 2 or 3 different ophthalmologists and there is some consideration for surgical intervention. He is going back to see his ophthalmologist in [facility], [place], for another opinion.  Outpatient Encounter Prescriptions as of 01/29/2013  Medication Sig Dispense Refill  . aspirin 81 MG tablet Take 81 mg by mouth daily.        Marland Kitchen atorvastatin (LIPITOR) 10 MG tablet Take 1 tablet (10 mg total) by mouth daily.  90 tablet  3  . bromocriptine (PARLODEL) 2.5 MG tablet Take 1 tablet (2.5 mg total) by mouth daily.  90 tablet  3  . felodipine (PLENDIL) 5 MG 24 hr tablet TAKE 1 TABLET DAILY  90 tablet  3  . fluticasone (VERAMYST) 27.5 MCG/SPRAY nasal spray Place 2 sprays into the nose daily.  10 g  3  . glucose blood (ONE TOUCH ULTRA TEST) test  strip 1 each by Other route daily. And lancets 1/day 250.00  100 each  12  . L-Methylfolate-Algae-B12-B6 (METANX) 3-90.314-2-35 MG CAPS TAKE 1 CAPSULE DAILY  90 capsule  3  . Lancets (ONETOUCH ULTRASOFT) lancets       . losartan (COZAAR) 50 MG tablet TAKE 1 TABLET TWICE A DAY  180 tablet  0  . metformin (FORTAMET) 1000 MG (OSM) 24 hr tablet TAKE 1 TABLET TWICE A DAY WITH MEALS  180 tablet  0  . metoprolol succinate (TOPROL-XL) 25 MG 24 hr tablet TAKE ONE-HALF (1/2) TABLET (12.5 MG TOTAL) DAILY  45 tablet  3  . montelukast (SINGULAIR) 10 MG tablet TAKE 1 TABLET AT BEDTIME  90 tablet  0  . pregabalin (LYRICA) 75 MG capsule Take 1 capsule (75 mg total) by mouth 2 (two) times daily.  180 capsule  3  . sitaGLIPtin (JANUVIA) 100 MG tablet Take 1 tablet (100 mg total) by mouth daily.  90 tablet  3  . Tamsulosin HCl (FLOMAX) 0.4 MG CAPS Take 1 capsule (0.4 mg total) by mouth daily.  90 capsule  3  . warfarin (COUMADIN) 6 MG tablet Take as directed by [facility]  120 tablet  1  . [DISCONTINUED] ciprofloxacin (CIPRO) 500 MG tablet       . [DISCONTINUED] fluticasone (FLONASE) 50 MCG/ACT nasal spray Place 2 sprays into the nose daily.  27.5 g  3  . [DISCONTINUED] IODOSORB 0.9 % gel        No  facility-administered encounter medications on file as of 01/29/2013.    Allergies  Allergen Reactions  . Amoxicillin Anaphylaxis  . Other Other (See Comments)    Muscle Relaxers; "I get very woozy"    Past Medical History  Diagnosis Date  . Hypertension   . Hyperlipidemia   . Coronary artery disease     MI '99  . Peripheral neuropathy   . Macular degeneration     has had intra-orbital injections. Has had laser treatment  . Prostate cancer     XRT 45 tx  . CLL (chronic lymphoblastic leukemia)   . Atrial fibrillation 2006    started 2006  . Type II diabetes mellitus   . Hepatitis C infection 1990's    ROS: Negative except as per HPI  BP 134/60  Pulse 60  Ht 5\' 11"  (1.803 m)  Wt 98.884 kg  (218 lb)  BMI 30.42 kg/m2  SpO2 94%  PHYSICAL EXAM: Pt is alert and oriented, pleasant overweight male in NAD HEENT: normal Neck: JVP - normal, carotids 2+= without bruits Lungs: CTA bilaterally CV: RRR without murmur or gallop Abd: soft, NT, Positive BS, no hepatomegaly Ext: no C/C/E, distal pulses intact and equal Skin: warm/dry no rash  ASSESSMENT AND PLAN: 1. Coronary artery disease, native vessel. The patient is stable without anginal symptoms. He is on aspirin for antiplatelet therapy, beta blocker, and an ARB in the setting of his diabetes. He is also tolerating atorvastatin. I will see him back in 6 months for followup.  2. Hypertension. Blood pressure is well controlled on his current program.  3. Hyperlipidemia. Lipids as above. He should have repeat lab studies done before his return.  4. Paroxysmal atrial fibrillation. He remains anticoagulated with warfarin. He is in sinus rhythm today by exam.  5. Type 2 diabetes. Lengthy discussion about his weight is a major issue. We discussed weight loss strategies with specific dietary recommendations. He understands that without weight loss, his medication list will likely grow and diabetes control will worsen.  6. Carotid stenosis. He has 60-79% stenosis on the right and 40-59% stenosis on the left. Recommend repeat duplex at the time of his next office visit. This will be about 18 month followup from his previous scan.  I will see him back in 6 months for followup.  Tonny Bollman 01/29/2013 3:07 PM

## 2013-01-29 NOTE — Progress Notes (Signed)
Subjective:    Patient ID: Ralph Gray, male    DOB: 1935/02/28, 78 y.o.   MRN: [id number]  HPI pt returns for f/u of type 2 DM (dx'ed 2010; complicated by peripheral sensory neuropathy, foot ulcer, CAD and PAD; he has never been on insulin; he can't take actos due to edema).  no cbg record, but states cbg's are in the mid-100's.  pt states he feels well in general.   Past Medical History  Diagnosis Date  . Hypertension   . Hyperlipidemia   . Coronary artery disease     MI '99  . Peripheral neuropathy   . Macular degeneration     has had intra-orbital injections. Has had laser treatment  . Prostate cancer     XRT 45 tx  . CLL (chronic lymphoblastic leukemia)   . Atrial fibrillation 2006    started 2006  . Type II diabetes mellitus   . Hepatitis C infection 1990's    Past Surgical History  Procedure Laterality Date  . Cardioversion    . Coronary artery bypass graft  1999    CABG [place]4; [place] [facility]     History   Social History  . Marital Status: Married    Spouse Name: N/A    Number of Children: N/A  . Years of Education: N/A   Occupational History  . Retired    Social History Main Topics  . Smoking status: Former Smoker -- 2.50 packs/day for 10 years    Types: Cigarettes    Quit date: 10/14/1963  . Smokeless tobacco: Never Used  . Alcohol Use: Yes     Comment: wine rarely  . Drug Use: No  . Sexually Active: Yes -- Male partner(s)   Other Topics Concern  . Not on file   Social History Narrative   [employer], [name], EDD - doctorate in education. Married '64 - 24yr/divorced ( severe bipolar disease); Married '07. 2 sons - '67, '66; 1 dtr - '70. 7 grandchildren. Exercise - 6 days a week: cardio and strengthening. 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 ACP- yes for CPR; no -long term ventilation; no heroic or futile measures.     Current Outpatient  Prescriptions on File Prior to Visit  Medication Sig Dispense Refill  . aspirin 81 MG tablet Take 81 mg by mouth daily.        Marland Kitchen atorvastatin (LIPITOR) 10 MG tablet Take 1 tablet (10 mg total) by mouth daily.  90 tablet  3  . bromocriptine (PARLODEL) 2.5 MG tablet Take 1 tablet (2.5 mg total) by mouth daily.  90 tablet  3  . felodipine (PLENDIL) 5 MG 24 hr tablet TAKE 1 TABLET DAILY  90 tablet  3  . fluticasone (VERAMYST) 27.5 MCG/SPRAY nasal spray Place 2 sprays into the nose daily.  10 g  3  . glucose blood (ONE TOUCH ULTRA TEST) test strip 1 each by Other route daily. And lancets 1/day 250.00  100 each  12  . L-Methylfolate-Algae-B12-B6 (METANX) 3-90.314-2-35 MG CAPS TAKE 1 CAPSULE DAILY  90 capsule  3  . Lancets (ONETOUCH ULTRASOFT) lancets       . losartan (COZAAR) 50 MG tablet TAKE 1 TABLET TWICE A DAY  180 tablet  0  . metformin (FORTAMET) 1000 MG (OSM) 24 hr tablet TAKE 1 TABLET TWICE A DAY WITH MEALS  180 tablet  0  . metoprolol succinate (TOPROL-XL) 25 MG 24  hr tablet TAKE ONE-HALF (1/2) TABLET (12.5 MG TOTAL) DAILY  45 tablet  3  . montelukast (SINGULAIR) 10 MG tablet TAKE 1 TABLET AT BEDTIME  90 tablet  0  . pregabalin (LYRICA) 75 MG capsule Take 1 capsule (75 mg total) by mouth 2 (two) times daily.  180 capsule  3  . sitaGLIPtin (JANUVIA) 100 MG tablet Take 1 tablet (100 mg total) by mouth daily.  90 tablet  3  . Tamsulosin HCl (FLOMAX) 0.4 MG CAPS Take 1 capsule (0.4 mg total) by mouth daily.  90 capsule  3  . warfarin (COUMADIN) 6 MG tablet Take as directed by [facility]  120 tablet  1  . [DISCONTINUED] multivitamin (METANX) 3-35-2 MG TABS tablet Take 1 tablet by mouth daily.  90 tablet  1   No current facility-administered medications on file prior to visit.    Allergies  Allergen Reactions  . Amoxicillin Anaphylaxis  . Other Other (See Comments)    Muscle Relaxers; "I get very woozy"    Family History  Problem Relation Age of Onset  . Heart disease Mother 18     died  . Breast cancer Mother     sarcoma  . Heart attack Father 40    died  . Heart disease Father     MI  . Diabetes Neg Hx   . COPD Neg Hx     BP 132/80  Pulse 59  Wt 215 lb (97.523 kg)  BMI 30 kg/m2  SpO2 97%   Review of Systems denies hypoglycemia.      Objective:   Physical Exam VITAL SIGNS:  See vs page GENERAL: no distress EXTEMITIES: sees podiatry. 1+ bilat edema.  There are bilat varicosities, and rust-colored skin.   Pulses: dorsalis pedis intact bilat.   NEURO:  sensation is intact to touch, but decreased from normal on the feet.    Lab Results  Component Value Date   HGBA1C 7.3* 01/29/2013      Assessment & Plan:  DM: he needs increased rx, if it can be done with a regimen that avoids or minimizes hypoglycemia.

## 2013-01-29 NOTE — Patient Instructions (Addendum)
check your blood sugar once a day.  vary the time of day when you check, between before the 3 meals, and at bedtime.  also check if you have symptoms of your blood sugar being too high or too low.  please keep a record of the readings and bring it to your next appointment here.  please call us sooner if your blood sugar goes below 70, or if you have a lot of readings over 200.   A blood sugar test is being requested for you today.  We'll contact you with results. If the sugar is high, we'll add "invokana."   Please come back for a follow-up appointment in 3 months.

## 2013-01-30 MED ORDER — CANAGLIFLOZIN 100 MG PO TABS: 1.0000 | ORAL_TABLET | Freq: Every day | ORAL | Status: DC

## 2013-02-01 ENCOUNTER — Telehealth: Payer: Self-pay | Admitting: Internal Medicine

## 2013-02-01 NOTE — Telephone Encounter (Signed)
Pt called req [facility] to [facility] Lyrica. Pt req 90 day supply and talking twice a day. Please advise.

## 2013-02-02 ENCOUNTER — Telehealth: Payer: Self-pay | Admitting: Endocrinology

## 2013-02-02 MED ORDER — PREGABALIN 75 MG PO CAPS: 75.0000 mg | ORAL_CAPSULE | Freq: Two times a day (BID) | ORAL | Status: DC

## 2013-02-02 NOTE — Telephone Encounter (Signed)
There is no generic.  Please take 1 per day

## 2013-02-02 NOTE — Telephone Encounter (Signed)
Left message on machine for pt 

## 2013-02-02 NOTE — Telephone Encounter (Signed)
Done hardcopy to robin  

## 2013-02-02 NOTE — Telephone Encounter (Signed)
Please call regarding new script - Invokana? Pt wants to know if there is a generic and how much is is he supposed to take - 1/2 or 1 table / Sherri S.

## 2013-02-02 NOTE — Telephone Encounter (Signed)
[facility]

## 2013-02-05 ENCOUNTER — Other Ambulatory Visit: Payer: Self-pay | Admitting: Internal Medicine

## 2013-02-16 ENCOUNTER — Ambulatory Visit (INDEPENDENT_AMBULATORY_CARE_PROVIDER_SITE_OTHER): Payer: Medicare Other | Admitting: *Deleted

## 2013-02-16 DIAGNOSIS — Z7901 Long term (current) use of anticoagulants: Secondary | ICD-10-CM

## 2013-02-16 DIAGNOSIS — IMO0002 Reserved for concepts with insufficient information to code with codable children: Secondary | ICD-10-CM

## 2013-02-16 LAB — POCT INR: INR: 1.9

## 2013-02-20 DIAGNOSIS — H35079 Retinal telangiectasis, unspecified eye: Secondary | ICD-10-CM | POA: Diagnosis not present

## 2013-02-20 DIAGNOSIS — H353290 Exudative age-related macular degeneration, unspecified eye, stage unspecified: Secondary | ICD-10-CM | POA: Diagnosis not present

## 2013-02-20 DIAGNOSIS — E119 Type 2 diabetes mellitus without complications: Secondary | ICD-10-CM | POA: Diagnosis not present

## 2013-02-20 DIAGNOSIS — H43819 Vitreous degeneration, unspecified eye: Secondary | ICD-10-CM | POA: Diagnosis not present

## 2013-02-28 DIAGNOSIS — B351 Tinea unguium: Secondary | ICD-10-CM | POA: Diagnosis not present

## 2013-02-28 DIAGNOSIS — E1149 Type 2 diabetes mellitus with other diabetic neurological complication: Secondary | ICD-10-CM | POA: Diagnosis not present

## 2013-02-28 DIAGNOSIS — E1159 Type 2 diabetes mellitus with other circulatory complications: Secondary | ICD-10-CM | POA: Diagnosis not present

## 2013-02-28 DIAGNOSIS — Q828 Other specified congenital malformations of skin: Secondary | ICD-10-CM | POA: Diagnosis not present

## 2013-03-27 DIAGNOSIS — H35429 Microcystoid degeneration of retina, unspecified eye: Secondary | ICD-10-CM | POA: Diagnosis not present

## 2013-03-27 DIAGNOSIS — H35349 Macular cyst, hole, or pseudohole, unspecified eye: Secondary | ICD-10-CM | POA: Diagnosis not present

## 2013-03-27 DIAGNOSIS — H43819 Vitreous degeneration, unspecified eye: Secondary | ICD-10-CM | POA: Diagnosis not present

## 2013-03-27 DIAGNOSIS — H2510 Age-related nuclear cataract, unspecified eye: Secondary | ICD-10-CM | POA: Diagnosis not present

## 2013-03-30 ENCOUNTER — Ambulatory Visit (INDEPENDENT_AMBULATORY_CARE_PROVIDER_SITE_OTHER): Payer: Medicare Other | Admitting: Pharmacist

## 2013-03-30 DIAGNOSIS — Z7901 Long term (current) use of anticoagulants: Secondary | ICD-10-CM | POA: Diagnosis not present

## 2013-03-30 DIAGNOSIS — IMO0002 Reserved for concepts with insufficient information to code with codable children: Secondary | ICD-10-CM

## 2013-04-01 ENCOUNTER — Other Ambulatory Visit: Payer: Self-pay | Admitting: Internal Medicine

## 2013-04-09 DIAGNOSIS — I70219 Atherosclerosis of native arteries of extremities with intermittent claudication, unspecified extremity: Secondary | ICD-10-CM | POA: Diagnosis not present

## 2013-04-09 DIAGNOSIS — I25810 Atherosclerosis of coronary artery bypass graft(s) without angina pectoris: Secondary | ICD-10-CM | POA: Diagnosis not present

## 2013-04-13 ENCOUNTER — Other Ambulatory Visit: Payer: Self-pay | Admitting: Internal Medicine

## 2013-04-16 ENCOUNTER — Other Ambulatory Visit: Payer: Self-pay | Admitting: Internal Medicine

## 2013-04-17 ENCOUNTER — Other Ambulatory Visit: Payer: Self-pay | Admitting: *Deleted

## 2013-04-17 ENCOUNTER — Other Ambulatory Visit: Payer: Self-pay | Admitting: Endocrinology

## 2013-04-17 MED ORDER — SITAGLIPTIN PHOSPHATE 100 MG PO TABS: 100.0000 mg | ORAL_TABLET | Freq: Every day | ORAL | Status: DC

## 2013-04-25 ENCOUNTER — Other Ambulatory Visit: Payer: Self-pay

## 2013-04-25 MED ORDER — LOSARTAN POTASSIUM 50 MG PO TABS: 50.0000 mg | ORAL_TABLET | Freq: Two times a day (BID) | ORAL | Status: DC

## 2013-04-25 NOTE — Telephone Encounter (Signed)
Phone call from patient stating the script sent in [facility]artan [facility] was incorrect. He takes it twice daily instead of once daily. This was corrected and patient notified.

## 2013-04-27 ENCOUNTER — Ambulatory Visit (INDEPENDENT_AMBULATORY_CARE_PROVIDER_SITE_OTHER): Payer: Medicare Other | Admitting: *Deleted

## 2013-04-27 DIAGNOSIS — Z7901 Long term (current) use of anticoagulants: Secondary | ICD-10-CM

## 2013-04-27 DIAGNOSIS — IMO0002 Reserved for concepts with insufficient information to code with codable children: Secondary | ICD-10-CM

## 2013-04-30 ENCOUNTER — Ambulatory Visit (INDEPENDENT_AMBULATORY_CARE_PROVIDER_SITE_OTHER): Payer: Medicare Other | Admitting: Endocrinology

## 2013-04-30 ENCOUNTER — Encounter: Payer: Self-pay | Admitting: Endocrinology

## 2013-04-30 VITALS — BP 126/80 | HR 80 | Ht 71.0 in | Wt 208.0 lb

## 2013-04-30 DIAGNOSIS — E1149 Type 2 diabetes mellitus with other diabetic neurological complication: Secondary | ICD-10-CM | POA: Diagnosis not present

## 2013-04-30 LAB — BASIC METABOLIC PANEL
BUN: 24 mg/dL — ABNORMAL HIGH (ref 6–23)
Creatinine, Ser: 1.4 mg/dL (ref 0.4–1.5)
GFR: 53.7 mL/min — ABNORMAL LOW (ref >60.00)

## 2013-04-30 LAB — HEMOGLOBIN A1C: Hgb A1c MFr Bld: 6.8 % — ABNORMAL HIGH (ref 4.6–6.5)

## 2013-04-30 NOTE — Patient Instructions (Addendum)
check your blood sugar once a day.  vary the time of day when you check, between before the 3 meals, and at bedtime.  also check if you have symptoms of your blood sugar being too high or too low.  please keep a record of the readings and bring it to your next appointment here.  please call us sooner if your blood sugar goes below 70, or if you have a lot of readings over 200.   A blood sugar test is being requested for you today.  We'll contact you with results.  Please come back for a follow-up appointment in 3 months.

## 2013-04-30 NOTE — Progress Notes (Signed)
Subjective:    Patient ID: Ralph Gray, male    DOB: Nov 29, 1934, 78 y.o.   MRN: [id number]  HPI pt returns for f/u of type 2 DM (dx'ed 2010; he has moderate neuropathy of the lower extremities, and associated foot ulcer, CAD and PAD; he has never been on insulin; he can't take actos due to edema).  no cbg record, but states cbg's are in the mid-100's.  pt states he feels well in general.   Past Medical History  Diagnosis Date  . Hypertension   . Hyperlipidemia   . Coronary artery disease     MI '99  . Peripheral neuropathy   . Macular degeneration     has had intra-orbital injections. Has had laser treatment  . Prostate cancer     XRT 45 tx  . CLL (chronic lymphoblastic leukemia)   . Atrial fibrillation 2006    started 2006  . Type II diabetes mellitus   . Hepatitis C infection 1990's    Past Surgical History  Procedure Laterality Date  . Cardioversion    . Coronary artery bypass graft  1999    CABG X4; [place] [facility].     History   Social History  . Marital Status: Married    Spouse Name: N/A    Number of Children: N/A  . Years of Education: N/A   Occupational History  . Retired    Social History Main Topics  . Smoking status: Former Smoker -- 2.50 packs/day for 10 years    Types: Cigarettes    Quit date: 10/14/1963  . Smokeless tobacco: Never Used  . Alcohol Use: Yes     Comment: wine rarely  . Drug Use: No  . Sexually Active: Yes -- Male partner(s)   Other Topics Concern  . Not on file   Social History Narrative   HSG, [name], EDD - doctorate in education. Married '64 - 57yr/divorced ( severe bipolar disease); Married '07. 2 sons - '67, '66; 1 dtr - '70. 7 grandchildren. Exercise - 6 days a week: cardio and strengthening. 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 ACP- yes for CPR; no -long term ventilation; no heroic or futile measures.      Current Outpatient Prescriptions on File Prior to Visit  Medication Sig Dispense Refill  . aspirin 81 MG tablet Take 81 mg by mouth daily.        Marland Kitchen atorvastatin (LIPITOR) 10 MG tablet Take 1 tablet (10 mg total) by mouth daily.  90 tablet  3  . bromocriptine (PARLODEL) 2.5 MG tablet Take 1 tablet (2.5 mg total) by mouth daily.  90 tablet  3  . Canagliflozin (INVOKANA) 100 MG TABS Take 1 tablet (100 mg total) by mouth daily.  30 tablet  11  . felodipine (PLENDIL) 5 MG 24 hr tablet TAKE 1 TABLET DAILY  90 tablet  3  . fluticasone (VERAMYST) 27.5 MCG/SPRAY nasal spray Place 2 sprays into the nose daily.  10 g  3  . glucose blood (ONE TOUCH ULTRA TEST) test strip 1 each by Other route daily. And lancets 1/day 250.00  100 each  12  . L-Methylfolate-Algae-B12-B6 (METANX) 3-90.314-2-35 MG CAPS TAKE 1 CAPSULE DAILY  90 capsule  3  . Lancets (ONETOUCH ULTRASOFT) lancets       . losartan (COZAAR) 50 MG tablet Take 1 tablet (50 mg total) by mouth 2 (two) times daily.  180 tablet  1  . metformin (FORTAMET) 1000 MG (OSM) 24 hr tablet TAKE 1 TABLET TWICE A DAY WITH MEALS  180 tablet  0  . metoprolol succinate (TOPROL-XL) 25 MG 24 hr tablet TAKE ONE-HALF (1/2) TABLET (12.5 MG TOTAL) DAILY  45 tablet  3  . montelukast (SINGULAIR) 10 MG tablet TAKE 1 TABLET AT BEDTIME  90 tablet  3  . pregabalin (LYRICA) 75 MG capsule Take 1 capsule (75 mg total) by mouth 2 (two) times daily.  180 capsule  1  . sitaGLIPtin (JANUVIA) 100 MG tablet Take 1 tablet (100 mg total) by mouth daily.  90 tablet  2  . Tamsulosin HCl (FLOMAX) 0.4 MG CAPS Take 1 capsule (0.4 mg total) by mouth daily.  90 capsule  3  . warfarin (COUMADIN) 6 MG tablet Take as directed by [facility]  120 tablet  1  . [DISCONTINUED] multivitamin (METANX) 3-35-2 MG TABS tablet Take 1 tablet by mouth daily.  90 tablet  1   No current facility-administered medications on file prior to visit.    Allergies  Allergen Reactions  . Amoxicillin  Anaphylaxis  . Other Other (See Comments)    Muscle Relaxers; "I get very woozy"    Family History  Problem Relation Age of Onset  . Heart disease Mother 1    died  . Breast cancer Mother     sarcoma  . Heart attack Father 40    died  . Heart disease Father     MI  . Diabetes Neg Hx   . COPD Neg Hx     BP 126/80  Pulse 80  Ht 5\' 11"  (1.803 m)  Wt 208 lb (94.348 kg)  BMI 29.02 kg/m2  SpO2 98%  Review of Systems denies hypoglycemia.  He has lost weight, due to his efforts.    Objective:   Physical Exam VITAL SIGNS:  See vs page GENERAL: no distress  Lab Results  Component Value Date   HGBA1C 6.8* 04/30/2013      Assessment & Plan:  DM: well-controlled.  there are nine oral agents available for type 2 diabetes.  This regimen gives the best risk-benefit ratio.

## 2013-05-01 ENCOUNTER — Other Ambulatory Visit: Payer: Self-pay | Admitting: Cardiology

## 2013-05-02 DIAGNOSIS — B351 Tinea unguium: Secondary | ICD-10-CM | POA: Diagnosis not present

## 2013-05-02 DIAGNOSIS — Q828 Other specified congenital malformations of skin: Secondary | ICD-10-CM | POA: Diagnosis not present

## 2013-05-02 DIAGNOSIS — E1149 Type 2 diabetes mellitus with other diabetic neurological complication: Secondary | ICD-10-CM | POA: Diagnosis not present

## 2013-05-02 DIAGNOSIS — M79609 Pain in unspecified limb: Secondary | ICD-10-CM | POA: Diagnosis not present

## 2013-05-25 ENCOUNTER — Ambulatory Visit (INDEPENDENT_AMBULATORY_CARE_PROVIDER_SITE_OTHER): Payer: Medicare Other | Admitting: Pharmacist

## 2013-05-25 DIAGNOSIS — Z7901 Long term (current) use of anticoagulants: Secondary | ICD-10-CM

## 2013-05-28 ENCOUNTER — Telehealth: Payer: Self-pay | Admitting: Internal Medicine

## 2013-05-28 MED ORDER — FLUTICASONE FUROATE 27.5 MCG/SPRAY NA SUSP: 2.0000 | Freq: Every day | NASAL | Status: DC

## 2013-05-28 NOTE — Telephone Encounter (Signed)
Requesting a refill on Flonase generic to be sent to his mail order for 90 day supply.

## 2013-06-01 ENCOUNTER — Telehealth: Payer: Self-pay | Admitting: *Deleted

## 2013-06-01 NOTE — Telephone Encounter (Signed)
Pt called and lvm stating that [facility] about his rx of fluticasone. ZOX#[id number]. Pt needs 90 day supply.

## 2013-06-03 ENCOUNTER — Other Ambulatory Visit: Payer: Self-pay | Admitting: Internal Medicine

## 2013-06-04 NOTE — Telephone Encounter (Signed)
This [facility] see 05/28/13 phone note

## 2013-06-12 ENCOUNTER — Other Ambulatory Visit: Payer: Self-pay | Admitting: Internal Medicine

## 2013-06-13 ENCOUNTER — Encounter: Payer: Self-pay | Admitting: Sports Medicine

## 2013-06-13 ENCOUNTER — Ambulatory Visit (INDEPENDENT_AMBULATORY_CARE_PROVIDER_SITE_OTHER): Payer: Medicare Other | Admitting: Sports Medicine

## 2013-06-13 VITALS — BP 120/68 | HR 60 | Ht 71.0 in | Wt 208.0 lb

## 2013-06-13 DIAGNOSIS — M7710 Lateral epicondylitis, unspecified elbow: Secondary | ICD-10-CM

## 2013-06-13 DIAGNOSIS — M25529 Pain in unspecified elbow: Secondary | ICD-10-CM | POA: Diagnosis not present

## 2013-06-13 DIAGNOSIS — G8929 Other chronic pain: Secondary | ICD-10-CM | POA: Diagnosis not present

## 2013-06-13 DIAGNOSIS — M7711 Lateral epicondylitis, right elbow: Secondary | ICD-10-CM

## 2013-06-13 NOTE — Progress Notes (Signed)
  Subjective:    Patient ID: Ralph Gray, male    DOB: Aug 30, 1935, 78 y.o.   MRN: [id number]  HPI chief complaint: Right elbow pain and swelling  Very pleasant 78 year old right-hand-dominant male comes in today complaining of 1 year of lateral right elbow pain. No specific trauma but gradual onset of pain that began while playing tennis. Since that time he has been unable to play tennis not only because of his elbow but because of other medical problems as well. He is not really bothered by his elbow with day-to-day activities or with working out in the gym but when he tries to hit a tennis ball he has returning discomfort. He has recently begun to notice some swelling over the lateral elbow which is persistent. It is painful if he strikes it against something. He denies any medial elbow pain. No associated numbness or tingling. He has tried a counterforce brace with some success. No prior elbow surgery. Main reason for today's visit is "to find out what the swelling is in my elbow".  Past medical history and current medications are reviewed. He has a history of coronary artery disease, atrial fibrillation, diabetes, and macular degeneration He is allergic to amoxicillin Former smoker Retired    Review of Systems     Objective:   Physical Exam Well-developed, well-nourished. No acute distress. Awake alert and oriented x3  Right elbow: Full range of motion. No effusion. There is a large palpable spur over the lateral epicondyle. It is tender to palpation. Mild tenderness to palpation just distal to this at the common extensor tendon but minimal pain with resisted wrist extension and middle finger extension. No tenderness to palpation over the medial elbow. Good grip strength. Good radial and ulnar pulses.  MSK ultrasound of the right elbow: Images in both long and short view were obtained. There is a large spur off of the lateral epicondyle which is digging into the common extensor tendon  resulting in hypoechoic changes. There are also several areas of calcification seen within the body of the tendon. This can be appreciated on both long and short view. Minimal neovascularity. Small joint effusion.       Assessment & Plan:  1. Right elbow pain secondary to lateral epicondylitis/common extensor tendon tendinopathy and large concomitant bone spur  We discussed treatment options but the patient states that his symptoms are quite tolerable. His macular degeneration has caused him to have to really cut back on his tennis and his pain is not present with any other activity. I gave him a comprehensive home exercise program and I'll recheck him in 4 weeks. I've mentioned a possible trial of topical nitroglycerin if his symptoms warrant. I would not recommend surgical excision. Call with questions or concerns in the interim.

## 2013-06-21 DIAGNOSIS — H6120 Impacted cerumen, unspecified ear: Secondary | ICD-10-CM | POA: Diagnosis not present

## 2013-06-21 DIAGNOSIS — H60399 Other infective otitis externa, unspecified ear: Secondary | ICD-10-CM | POA: Diagnosis not present

## 2013-06-21 DIAGNOSIS — H6980 Other specified disorders of Eustachian tube, unspecified ear: Secondary | ICD-10-CM | POA: Diagnosis not present

## 2013-06-21 DIAGNOSIS — J310 Chronic rhinitis: Secondary | ICD-10-CM | POA: Diagnosis not present

## 2013-06-21 DIAGNOSIS — J3489 Other specified disorders of nose and nasal sinuses: Secondary | ICD-10-CM | POA: Diagnosis not present

## 2013-06-21 DIAGNOSIS — H9209 Otalgia, unspecified ear: Secondary | ICD-10-CM | POA: Diagnosis not present

## 2013-06-22 ENCOUNTER — Ambulatory Visit (INDEPENDENT_AMBULATORY_CARE_PROVIDER_SITE_OTHER): Payer: Medicare Other | Admitting: Pharmacist

## 2013-06-22 ENCOUNTER — Telehealth: Payer: Self-pay | Admitting: Endocrinology

## 2013-06-22 DIAGNOSIS — Z7901 Long term (current) use of anticoagulants: Secondary | ICD-10-CM | POA: Diagnosis not present

## 2013-06-22 NOTE — Telephone Encounter (Signed)
Recd records from [facility],Forward 5pg to Dr.Ellison

## 2013-07-09 ENCOUNTER — Other Ambulatory Visit: Payer: Self-pay | Admitting: Internal Medicine

## 2013-07-10 DIAGNOSIS — H43829 Vitreomacular adhesion, unspecified eye: Secondary | ICD-10-CM | POA: Diagnosis not present

## 2013-07-10 DIAGNOSIS — H35349 Macular cyst, hole, or pseudohole, unspecified eye: Secondary | ICD-10-CM | POA: Diagnosis not present

## 2013-07-10 DIAGNOSIS — H43819 Vitreous degeneration, unspecified eye: Secondary | ICD-10-CM | POA: Diagnosis not present

## 2013-07-10 DIAGNOSIS — H353290 Exudative age-related macular degeneration, unspecified eye, stage unspecified: Secondary | ICD-10-CM | POA: Diagnosis not present

## 2013-07-11 ENCOUNTER — Encounter: Payer: Self-pay | Admitting: Internal Medicine

## 2013-07-11 ENCOUNTER — Other Ambulatory Visit (INDEPENDENT_AMBULATORY_CARE_PROVIDER_SITE_OTHER): Payer: Medicare Other

## 2013-07-11 ENCOUNTER — Ambulatory Visit (INDEPENDENT_AMBULATORY_CARE_PROVIDER_SITE_OTHER): Payer: Medicare Other | Admitting: Sports Medicine

## 2013-07-11 ENCOUNTER — Ambulatory Visit (INDEPENDENT_AMBULATORY_CARE_PROVIDER_SITE_OTHER): Payer: Medicare Other | Admitting: Internal Medicine

## 2013-07-11 ENCOUNTER — Encounter: Payer: Self-pay | Admitting: Sports Medicine

## 2013-07-11 VITALS — BP 105/61 | Ht 71.0 in | Wt 210.0 lb

## 2013-07-11 VITALS — BP 110/60 | HR 52 | Temp 96.8°F | Wt 208.0 lb

## 2013-07-11 DIAGNOSIS — E785 Hyperlipidemia, unspecified: Secondary | ICD-10-CM

## 2013-07-11 DIAGNOSIS — I10 Essential (primary) hypertension: Secondary | ICD-10-CM

## 2013-07-11 DIAGNOSIS — Z23 Encounter for immunization: Secondary | ICD-10-CM | POA: Diagnosis not present

## 2013-07-11 DIAGNOSIS — G8929 Other chronic pain: Secondary | ICD-10-CM | POA: Diagnosis not present

## 2013-07-11 DIAGNOSIS — M779 Enthesopathy, unspecified: Secondary | ICD-10-CM | POA: Insufficient documentation

## 2013-07-11 DIAGNOSIS — M25529 Pain in unspecified elbow: Secondary | ICD-10-CM | POA: Diagnosis not present

## 2013-07-11 DIAGNOSIS — C61 Malignant neoplasm of prostate: Secondary | ICD-10-CM | POA: Diagnosis not present

## 2013-07-11 DIAGNOSIS — M7711 Lateral epicondylitis, right elbow: Secondary | ICD-10-CM

## 2013-07-11 DIAGNOSIS — E119 Type 2 diabetes mellitus without complications: Secondary | ICD-10-CM

## 2013-07-11 DIAGNOSIS — G629 Polyneuropathy, unspecified: Secondary | ICD-10-CM

## 2013-07-11 DIAGNOSIS — C9110 Chronic lymphocytic leukemia of B-cell type not having achieved remission: Secondary | ICD-10-CM

## 2013-07-11 DIAGNOSIS — M7710 Lateral epicondylitis, unspecified elbow: Secondary | ICD-10-CM

## 2013-07-11 DIAGNOSIS — Z0000 Encounter for general adult medical examination without abnormal findings: Secondary | ICD-10-CM

## 2013-07-11 DIAGNOSIS — I2510 Atherosclerotic heart disease of native coronary artery without angina pectoris: Secondary | ICD-10-CM

## 2013-07-11 DIAGNOSIS — I878 Other specified disorders of veins: Secondary | ICD-10-CM | POA: Insufficient documentation

## 2013-07-11 DIAGNOSIS — I872 Venous insufficiency (chronic) (peripheral): Secondary | ICD-10-CM

## 2013-07-11 DIAGNOSIS — N401 Enlarged prostate with lower urinary tract symptoms: Secondary | ICD-10-CM

## 2013-07-11 DIAGNOSIS — H3530 Unspecified macular degeneration: Secondary | ICD-10-CM

## 2013-07-11 DIAGNOSIS — M898X9 Other specified disorders of bone, unspecified site: Secondary | ICD-10-CM

## 2013-07-11 DIAGNOSIS — N138 Other obstructive and reflux uropathy: Secondary | ICD-10-CM

## 2013-07-11 LAB — COMPREHENSIVE METABOLIC PANEL
ALT: 25 U/L (ref 0–53)
Albumin: 4.4 g/dL (ref 3.5–5.2)
CO2: 27 mEq/L (ref 19–32)
Calcium: 9.8 mg/dL (ref 8.4–10.5)
Chloride: 103 mEq/L (ref 96–112)
Creatinine, Ser: 1.3 mg/dL (ref 0.4–1.5)
GFR: 55.07 mL/min — ABNORMAL LOW (ref >60.00)
Potassium: 4.4 mEq/L (ref 3.5–5.1)
Total Bilirubin: 0.7 mg/dL (ref 0.3–1.2)

## 2013-07-11 LAB — LIPID PANEL
HDL: 43.5 mg/dL (ref >39.00)
Triglycerides: 257 mg/dL — ABNORMAL HIGH (ref 0.0–149.0)

## 2013-07-11 LAB — HEPATIC FUNCTION PANEL
Albumin: 4.4 g/dL (ref 3.5–5.2)
Alkaline Phosphatase: 46 U/L (ref 39–117)
Bilirubin, Direct: 0.1 mg/dL (ref 0.0–0.3)
Total Protein: 7 g/dL (ref 6.0–8.3)

## 2013-07-11 LAB — LDL CHOLESTEROL, DIRECT: Direct LDL: 59.4 mg/dL

## 2013-07-11 MED ORDER — ATORVASTATIN CALCIUM 10 MG PO TABS: 10.0000 mg | ORAL_TABLET | Freq: Every day | ORAL | Status: DC

## 2013-07-11 MED ORDER — PREGABALIN 75 MG PO CAPS: 75.0000 mg | ORAL_CAPSULE | Freq: Two times a day (BID) | ORAL | Status: DC

## 2013-07-11 NOTE — Assessment & Plan Note (Signed)
Continue current management

## 2013-07-11 NOTE — Assessment & Plan Note (Addendum)
Continue daily low dose aspirin and warfarin, managed by cardiologist, Dr. Excell Seltzer.

## 2013-07-11 NOTE — Assessment & Plan Note (Addendum)
Has not had follow up since moving to [place]. He has been asymptomatic.  Plan PSA to be checked with next lab draw.

## 2013-07-11 NOTE — Assessment & Plan Note (Signed)
Consider nitroglycerin patch. Use topical NSAID such as voltaren gel or patch to relive pain as well topical pain relievers like icy-hot.

## 2013-07-11 NOTE — Assessment & Plan Note (Signed)
Continue follow-up with Dr. Allyne Gee.

## 2013-07-11 NOTE — Assessment & Plan Note (Signed)
Continue current management with pregabalin.

## 2013-07-11 NOTE — Assessment & Plan Note (Signed)
Continue current management and follow-up with cardiology. Monitor for any symptoms of chest pain, SOB, DOE, or palpitations.

## 2013-07-11 NOTE — Assessment & Plan Note (Signed)
Will check CMP to evaluate renal function today. Patient was advised to use compression stockings, but deferred.

## 2013-07-11 NOTE — Progress Notes (Signed)
  Subjective:    Patient ID: Ralph Gray, male    DOB: 02-18-1935, 78 y.o.   MRN: [id number]  HPI 78 yo presents for 4 week f/u regarding lateral epicondylitis and extensor tendonopathy  Patient has been doing the exercises but is unsure if its been helpful Pt does not feel like its limiting his activities since it was tennis that really aggravated it He no longer has the ability to play tennis due to his macular degeneration Sometimes he hit it on something but overall he says that he can "deal with it" Given pt other comorbid conditions, he is apprehensive about starting any pharmacologic treatment No hx of migraines Not on any phosphodiesterase inhibitors for erectile dysfunction       Review of Systems     Objective:   Physical Exam General: no acute distress, well developed, well nourished MSK: Well-developed, well-nourished. No acute distress. Awake alert and oriented x3  Right elbow: Full range of motion. No effusion. There is a large palpable spur over the lateral epicondyle. It is tender to palpation. Mild tenderness to palpation just distal to this at the common extensor tendon. Moderate pain with resisted wrist extension and middle finger extension. No tenderness to palpation over the medial elbow. Good grip strength. Good radial and ulnar pulses.       Assessment & Plan:  Lateral epicondylitis and extensor tendonopathy: stable. Currently not significantly hindering his lifestyle. Given hx of CABG and DM2, he is hesitant to start any new medications and does not want to start NGT at this time.Pt not interested in having surgery.  - cont HEP - wear counterforce brace prn - F/U prn  Areta Haber, PGY-3

## 2013-07-11 NOTE — Assessment & Plan Note (Addendum)
Continue follow-up with cardiology. Will re-check lipids today, as well as CMP to evaluate for possible hypokalemia as etiology for muscle cramps. LFTs to evaluate for statin side effects.

## 2013-07-11 NOTE — Assessment & Plan Note (Addendum)
Continue exercise plan and medication management with sitagliptin, metformin, and pregabalin. Continued follow up with Dr. Everardo All for endocrinology and Dr. Charlsie Merles For podiatry.

## 2013-07-11 NOTE — Progress Notes (Signed)
Subjective:     Patient ID: Ralph Gray, male   DOB: May 04, 1935, 78 y.o.   MRN: [id number]  HPI The patient is here for annual Medicare wellness examination and management of other chronic and acute problems. Ralph Gray presents with bone spur on right elbow, for which he recently saw Dr. Margaretha Sheffield, a sports medicine fellow, who recommended a "nitro patch" or surgical treatment. This spur does not cause constant pain, but is painful when he plays tennis.    The risk factors are reflected in the social history.  The roster of all physicians providing medical care to patient - is listed in the Snapshot section of the chart.  Activities of daily living:  The patient is 100% inedpendent in all ADLs: dressing, toileting, feeding as well as independent mobility. Lives with wife and both perform their own ADLs. 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 Wife works as the Psychologist, clinical at [employer].   Home safety : The patient has smoke detectors in the home. They wear seatbelts. No firearms at home. There is no violence in the home.   There is no risks for hepatitis, STDs or HIV. There is no  history of blood transfusion. They have no travel history to infectious disease endemic areas of the world. Some travel outside the country but not outside of [place].   The patient has seen their dentist in the last six month. Patient reports seeing Dr. Allyne Gee for opthamology last on 07/10/13 for wet macular degeneration with "macular holes."  Reports that eyesight is limiting his activity, and is now unable to play tennis like he used to. He admits to hearing difficulty, and has had normal audiologic testing in the past year with Dr. Emeline Darling. Has had 2 tubes placed in the past year after having pressure in his ears after hyperbaric treatment for foot wound. Roughly 2 weeks ago, was given an antibiotic by Dr. Emeline Darling for a possible ear infection but notes no improvement in hearing and continues  to feel like ear is clogged. Plans to follow-up with Dr. Emeline Darling pending continued symptoms. Diminished hearing does not currently limit activities. They do not have excessive sun exposure. Discussed the need for sun protection: hats, long sleeves and use of sunscreen if there is significant sun exposure.   Diet: the importance of a healthy diet is discussed. Reports a 10 pound weight los since the last visit because of attempts to lose weight by diet. Eats at home--diet mostly consists of green vegetables, salads, fish, [place], chicken.   The patient has a regular exercise program: ellipitcal, biking, walking on the treadmill, and lifting weights at [employer] [facility], for 20 minutes on each, 6 times per week.  The benefits of regular aerobic exercise were discussed.  Depression screen: there are no signs or vegative symptoms of depression- irritability, change in appetite, anhedonia, sadness/tearfullness.  Cognitive assessment: the patient manages all their financial and personal affairs and is actively engaged.   The following portions of the patient's history were reviewed and updated as appropriate: allergies, current medications, past family history, past medical history,  past surgical history, past social history  and problem list.  Vision, hearing, body mass index were assessed and reviewed.   During the course of the visit the patient was educated and counseled about appropriate screening and preventive services including : fall prevention , diabetes screening, nutrition counseling, colorectal cancer screening, and recommended immunizations. Last colonoscopy was 2 years ago, after which they recommended  having a repeat in 1 year for possible adenomatous polyps. He saw Dr. Christella Hartigan in November of 2013 who suggested a follow-up appointment in about a year.   Past Medical History  Diagnosis Date  . Hypertension   . Hyperlipidemia   . Coronary artery disease     MI '99  . Peripheral  neuropathy   . Macular degeneration     has had intra-orbital injections. Has had laser treatment  . Prostate cancer     XRT 47 tx  . CLL (chronic lymphoblastic leukemia)   . Atrial fibrillation 2006    started 2006  . Type II diabetes mellitus   . Hepatitis C infection 1990's   Past Surgical History  Procedure Laterality Date  . Cardioversion    . Coronary artery bypass graft  1999    CABG X4; [place] [facility].    Family History  Problem Relation Age of Onset  . Heart disease Mother 78    died  . Breast cancer Mother     sarcoma  . Heart attack Father 40    died  . Heart disease Father     MI  . Diabetes Neg Hx   . COPD Neg Hx    History   Social History  . Marital Status: Married    Spouse Name: N/A    Number of Children: N/A  . Years of Education: N/A   Occupational History  . Retired    Social History Main Topics  . Smoking status: Former Smoker -- 2.50 packs/day for 10 years    Types: Cigarettes    Quit date: 10/14/1963  . Smokeless tobacco: Never Used  . Alcohol Use: No  . Drug Use: No  . Sexual Activity: Yes    Partners: Female   Other Topics Concern  . Not on file   Social History Narrative   HSG, [name], EDD - doctorate in education. Married '64 - 32yr/divorced ( severe bipolar disease); Married '07. 2 sons - '67, '66; 1 dtr - '70. 7 grandchildren. Exercise - 6 days a week: cardio and strengthening. 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 ACP- yes for CPR; no -long term ventilation; no heroic or futile measures.     Current Outpatient Prescriptions on File Prior to Visit  Medication Sig Dispense Refill  . aspirin 81 MG tablet Take 81 mg by mouth daily.        . bromocriptine (PARLODEL) 2.5 MG tablet Take 1 tablet (2.5 mg total) by mouth daily.  90 tablet  3  . Canagliflozin (INVOKANA) 100 MG TABS Take 1 tablet (100 mg total) by mouth  daily.  30 tablet  11  . felodipine (PLENDIL) 5 MG 24 hr tablet TAKE 1 TABLET DAILY  90 tablet  3  . fluticasone (FLONASE) 50 MCG/ACT nasal spray USE 2 SPRAYS NASALLY DAILY  48 g  3  . glucose blood (ONE TOUCH ULTRA TEST) test strip 1 each by Other route daily. And lancets 1/day 250.00  100 each  12  . L-Methylfolate-Algae-B12-B6 (METANX) 3-90.314-2-35 MG CAPS TAKE 1 CAPSULE DAILY  90 capsule  2  . Lancets (ONETOUCH ULTRASOFT) lancets       . losartan (COZAAR) 50 MG tablet Take 1 tablet (50 mg total) by mouth 2 (two) times daily.  180 tablet  1  . metformin (FORTAMET) 1000 MG (OSM) 24 hr tablet TAKE 1 TABLET TWICE A DAY WITH MEALS  180 tablet  0  . metoprolol succinate (TOPROL-XL) 25 MG 24 hr tablet TAKE ONE-HALF (1/2) TABLET (12.5 MG TOTAL) DAILY  45 tablet  3  . montelukast (SINGULAIR) 10 MG tablet TAKE 1 TABLET AT BEDTIME  90 tablet  3  . sitaGLIPtin (JANUVIA) 100 MG tablet Take 1 tablet (100 mg total) by mouth daily.  90 tablet  2  . tamsulosin (FLOMAX) 0.4 MG CAPS capsule TAKE 1 CAPSULE TWICE A DAY  180 capsule  1  . Tamsulosin HCl (FLOMAX) 0.4 MG CAPS Take 1 capsule (0.4 mg total) by mouth daily.  90 capsule  3  . warfarin (COUMADIN) 6 MG tablet TAKE AS DIRECTED BY COUMADIN CLINIC  120 tablet  1  . [DISCONTINUED] multivitamin (METANX) 3-35-2 MG TABS tablet Take 1 tablet by mouth daily.  90 tablet  1   No current facility-administered medications on file prior to visit.     Review of Systems Constitutional:  Negative for fever, chills, activity change. Lost weight intentionally through diet and exercise.  HEENT: Sensation of ear pressure and pain on right ear since tubes placed as documented in HPI. Negative for congestion, neck stiffness and postnasal drip. Negative for sore throat or swallowing problems. Negative for dental complaints.   Eyes: Stable loss of central vision due to macular degeneration.  Respiratory: Negative for chest tightness and wheezing. Negative for DOE.    Cardiovascular: Negative for chest pain or palpitations. No decreased exercise tolerance.  Gastrointestinal: Occasional constipation with associated abdominal cramps, attributed to diabetes medications. No bloating or gas. No reflux or indigestion.  Genitourinary: Negative for flank pain and difficulty urinating. Increased urinary frequency, including waking up to urinate 3 times nightly.  Musculoskeletal: Negative for back pain and gait problem. Bone spur on the right elbow. Painful to the touch and when playing tennis. Occasional muscle cramps at night, attributed to dehydration.  Neurological: Negative for dizziness, tremors, weakness and headaches.  Hematological: Negative for adenopathy.  Psychiatric/Behavioral: Negative for behavioral problems and dysphoric mood.      Objective:   Physical Exam Filed Vitals:   07/11/13 1345  BP: 110/60  Pulse: 52  Temp: 96.8 F (36 C)   Wt Readings from Last 3 Encounters:  07/11/13 208 lb (94.348 kg)  07/11/13 210 lb (95.255 kg)  06/13/13 208 lb (94.348 kg)   Gen'l: Well nourished well developed obese male in no acute distress  HEENT: Head: Normocephalic and atraumatic. Right Ear: External ear normal. TM obscured by wax. Left Ear: External ear normal.  EAC/TM normal with tympanostomy tube in place. Nose: Nose normal. Mouth/Throat: Oropharynx is clear and moist. Dentition - native, in good repair. No buccal or palatal lesions. Posterior pharynx clear. Eyes: Conjunctivae and sclera clear. EOM and accommadation intact.No eye discharge.  Neck: Normal range of motion. Neck supple. No thyromegaly present.  Cardiovascular: Normal rate, regular rhythm, no gallop, no friction rub, no murmur heard.      Quiet precordium. 2+ radial and DP pulses . No carotid bruits Pulmonary/Chest: Effort normal. No respiratory distress or increased WOB, no wheezes, no rales. No chest wall deformity or CVAT. Abdomen: Soft, non-tender. Hiatal hernia in the central abdomen  visible on straining, which is not painful. Bowel sounds are normal in all quadrants. No hepatosplenomegaly. Musculoskeletal: 2+ bilateral symmetric lower extremity edema up to mid-calf. On right elbow has non-erythematous, non-swollen, tender bone spur. Pain on right elbow rotation. No other joint pain or restriction in motion.  Lymphadenopathy: He has no cervical or supraclavicular  adenopathy.  Neurological: Bilaterally lower extremity diminished sensation across plantar surface of feet. Improving sensation on dorsum of feet, and normal sensation over shins/legs. He is alert and oriented to person, place, and time. CN II-XII intact. DTRs 2+ and symmetrical biceps and patellar tendons. Cerebellar function normal with no tremor, rigidity, normal gait and station.  Skin: Skin is warm and dry. Scattered seborrheic keratosis and cherry angiomas. No rash noted. No erythema.  Psychiatric: He has a normal mood and affect. His behavior is normal. Thought content normal.     Assessment:     Ralph Gray is a 78 year old male with multiple medical comorbidities including HTN, CAD with history of MI, atrial flutter, diabetes with history of peripheral neuropathy and diabetic foot ulcer, hyperlipidemia, prostate cancer, and CLL. Ralph Gray' acute medical complaint today includes management of a painful bone spur on his elbow. The history and physical were most consistent with lateral epicondylitis causing the pain on palpation and movement.     Plan:     See plan by problem list.

## 2013-07-12 ENCOUNTER — Other Ambulatory Visit: Payer: Self-pay | Admitting: Internal Medicine

## 2013-07-12 DIAGNOSIS — Z0000 Encounter for general adult medical examination without abnormal findings: Secondary | ICD-10-CM | POA: Insufficient documentation

## 2013-07-12 DIAGNOSIS — N401 Enlarged prostate with lower urinary tract symptoms: Secondary | ICD-10-CM | POA: Insufficient documentation

## 2013-07-12 NOTE — Assessment & Plan Note (Signed)
Symptoms, especially nocturia, are adequately controlled.  Plan Continue Tamsulosin

## 2013-07-12 NOTE — Assessment & Plan Note (Signed)
Interval history - since his last visit he has had close follow up with cardiology, endocrinology and has seen sports medicine for tennis elbow. With all this he is generally feeling well and remains very active. Physical exam is notable for tenderness right lateral epicondyle and associated tendons otherwise normal. He is current with colorectal cancer screening. Immunization data needs to be brought up to date. ACP is documented.  In summary -  A nice man with many problems but who appears medically stable at this exam. He will return as needed or in 1 year.

## 2013-07-13 ENCOUNTER — Encounter: Payer: Self-pay | Admitting: Internal Medicine

## 2013-07-19 ENCOUNTER — Encounter: Payer: Self-pay | Admitting: Podiatry

## 2013-07-19 ENCOUNTER — Ambulatory Visit (INDEPENDENT_AMBULATORY_CARE_PROVIDER_SITE_OTHER): Payer: Medicare Other | Admitting: Podiatry

## 2013-07-19 VITALS — BP 145/79 | HR 55 | Resp 16

## 2013-07-19 DIAGNOSIS — B351 Tinea unguium: Secondary | ICD-10-CM

## 2013-07-19 DIAGNOSIS — M79609 Pain in unspecified limb: Secondary | ICD-10-CM

## 2013-07-19 NOTE — Progress Notes (Signed)
Subjective:     Patient ID: Ralph Gray, male   DOB: [birth year], 78 y.o.   MRN: [id number]  HPI patient states my toenails are bothering me and I cannot cut them. Painful in shoes.   Review of Systems  All other systems reviewed and are negative.       Objective:   Physical Exam  Cardiovascular: Intact distal pulses.   Neurological: He is alert.  Skin: Skin is warm.   nails are incurvated and elongated 1-5 bilateral.     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    Plan:     Cutting of toenails 1-5 bilateral no iatrogenic bleeding noted

## 2013-07-30 ENCOUNTER — Encounter: Payer: Self-pay | Admitting: Endocrinology

## 2013-07-30 ENCOUNTER — Ambulatory Visit (INDEPENDENT_AMBULATORY_CARE_PROVIDER_SITE_OTHER): Payer: Medicare Other | Admitting: Endocrinology

## 2013-07-30 VITALS — BP 110/82 | HR 60 | Temp 97.5°F | Ht 71.0 in | Wt 205.6 lb

## 2013-07-30 DIAGNOSIS — E1149 Type 2 diabetes mellitus with other diabetic neurological complication: Secondary | ICD-10-CM | POA: Diagnosis not present

## 2013-07-30 DIAGNOSIS — E119 Type 2 diabetes mellitus without complications: Secondary | ICD-10-CM

## 2013-07-30 LAB — MICROALBUMIN / CREATININE URINE RATIO: Creatinine,U: 106.9 mg/dL

## 2013-07-30 NOTE — Progress Notes (Signed)
Subjective:    Patient ID: Ralph Gray, male    DOB: [birth year], 78 y.o.   MRN: [id number]  HPI pt returns for f/u of type 2 DM (dx'ed 2010; he has moderate neuropathy of the lower extremities, and associated foot ulcer, CAD and PAD; he has never been on insulin; he can't take actos due to edema).  no cbg record, but states cbg's are in the low-100's.  pt states he feels well in general.  Past Medical History  Diagnosis Date  . Hypertension   . Hyperlipidemia   . Coronary artery disease     MI '99  . Peripheral neuropathy   . Macular degeneration     has had intra-orbital injections. Has had laser treatment  . Prostate cancer     XRT 45 tx  . CLL (chronic lymphoblastic leukemia)   . Atrial fibrillation 2006    started 2006  . Type II diabetes mellitus   . Hepatitis C infection 1990's    Past Surgical History  Procedure Laterality Date  . Cardioversion    . Coronary artery bypass graft  1999    CABG X4; [place] [facility].     History   Social History  . Marital Status: Married    Spouse Name: N/A    Number of Children: N/A  . Years of Education: N/A   Occupational History  . Retired    Social History Main Topics  . Smoking status: Former Smoker -- 2.50 packs/day for 10 years    Types: Cigarettes    Quit date: 10/14/1963  . Smokeless tobacco: Never Used  . Alcohol Use: No  . Drug Use: No  . Sexual Activity: Yes    Partners: Female   Other Topics Concern  . Not on file   Social History Narrative   HSG, [name], [employer] - doctorate in education. Married '64 - 52yr/divorced ( severe bipolar disease); Married '07. 2 sons - '67, '66; 1 dtr - '70. 7 grandchildren. Exercise - 6 days a week: cardio and strengthening. 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 ACP- yes for CPR; no -long term ventilation; no heroic or futile measures.     Current Outpatient  Prescriptions on File Prior to Visit  Medication Sig Dispense Refill  . aspirin 81 MG tablet Take 81 mg by mouth daily.        Marland Kitchen atorvastatin (LIPITOR) 10 MG tablet Take 1 tablet (10 mg total) by mouth daily.  90 tablet  3  . bromocriptine (PARLODEL) 2.5 MG tablet Take 1 tablet (2.5 mg total) by mouth daily.  90 tablet  3  . Canagliflozin (INVOKANA) 100 MG TABS Take 1 tablet (100 mg total) by mouth daily.  30 tablet  11  . felodipine (PLENDIL) 5 MG 24 hr tablet TAKE 1 TABLET DAILY  90 tablet  3  . fluticasone (FLONASE) 50 MCG/ACT nasal spray USE 2 SPRAYS NASALLY DAILY  48 g  3  . glucose blood (ONE TOUCH ULTRA TEST) test strip 1 each by Other route daily. And lancets 1/day 250.00  100 each  12  . L-Methylfolate-Algae-B12-B6 (METANX) 3-90.314-2-35 MG CAPS TAKE 1 CAPSULE DAILY  90 capsule  2  . Lancets (ONETOUCH ULTRASOFT) lancets       . losartan (COZAAR) 50 MG tablet Take 1 tablet (50 mg total) by mouth 2 (two) times daily.  180 tablet  1  . metformin (FORTAMET) 1000 MG (OSM)  24 hr tablet TAKE 1 TABLET TWICE A DAY WITH MEALS  180 tablet  1  . metoprolol succinate (TOPROL-XL) 25 MG 24 hr tablet TAKE ONE-HALF (1/2) TABLET (12.5 MG TOTAL) DAILY  45 tablet  3  . montelukast (SINGULAIR) 10 MG tablet TAKE 1 TABLET AT BEDTIME  90 tablet  3  . pregabalin (LYRICA) 75 MG capsule Take 1 capsule (75 mg total) by mouth 2 (two) times daily.  180 capsule  1  . sitaGLIPtin (JANUVIA) 100 MG tablet Take 1 tablet (100 mg total) by mouth daily.  90 tablet  2  . tamsulosin (FLOMAX) 0.4 MG CAPS capsule TAKE 1 CAPSULE TWICE A DAY  180 capsule  1  . Tamsulosin HCl (FLOMAX) 0.4 MG CAPS Take 1 capsule (0.4 mg total) by mouth daily.  90 capsule  3  . warfarin (COUMADIN) 6 MG tablet TAKE AS DIRECTED BY COUMADIN CLINIC  120 tablet  1  . [DISCONTINUED] multivitamin (METANX) 3-35-2 MG TABS tablet Take 1 tablet by mouth daily.  90 tablet  1   No current facility-administered medications on file prior to visit.    Allergies   Allergen Reactions  . Amoxicillin Anaphylaxis  . Other Other (See Comments)    Muscle Relaxers; "I get very woozy"    Family History  Problem Relation Age of Onset  . Heart disease Mother 37    died  . Breast cancer Mother     sarcoma  . Heart attack Father 40    died  . Heart disease Father     MI  . Diabetes Neg Hx   . COPD Neg Hx    BP 110/82  Pulse 60  Temp(Src) 97.5 F (36.4 C) (Oral)  Ht 5\' 11"  (1.803 m)  Wt 205 lb 9.6 oz (93.26 kg)  BMI 28.69 kg/m2  SpO2 96%  Review of Systems denies hypoglycemia.  He has lost weight, due to his efforts.    Objective:   Physical Exam VITAL SIGNS:  See vs page GENERAL: no distress  Lab Results  Component Value Date   HGBA1C 6.7* 07/30/2013      Assessment & Plan:  DM: well-controlled.  there are nine oral agent classes available for type 2 diabetes.  This regimen gives the best risk-benefit ratio.  CAD: in this context, he should avoid hypoglycemia.

## 2013-07-30 NOTE — Patient Instructions (Addendum)
check your blood sugar once a day.  vary the time of day when you check, between before the 3 meals, and at bedtime.  also check if you have symptoms of your blood sugar being too high or too low.  please keep a record of the readings and bring it to your next appointment here.  please call us sooner if your blood sugar goes below 70, or if you have a lot of readings over 200.   A blood sugar test is being requested for you today.  We'll contact you with results.  Please come back for a follow-up appointment in 4 months.       

## 2013-08-01 ENCOUNTER — Ambulatory Visit (INDEPENDENT_AMBULATORY_CARE_PROVIDER_SITE_OTHER): Payer: Medicare Other | Admitting: Cardiovascular Disease

## 2013-08-01 ENCOUNTER — Ambulatory Visit (INDEPENDENT_AMBULATORY_CARE_PROVIDER_SITE_OTHER): Payer: Medicare Other | Admitting: Pharmacist

## 2013-08-01 ENCOUNTER — Encounter: Payer: Self-pay | Admitting: Cardiovascular Disease

## 2013-08-01 VITALS — BP 122/68 | HR 53 | Ht 71.0 in | Wt 208.0 lb

## 2013-08-01 DIAGNOSIS — I2510 Atherosclerotic heart disease of native coronary artery without angina pectoris: Secondary | ICD-10-CM | POA: Diagnosis not present

## 2013-08-01 DIAGNOSIS — I10 Essential (primary) hypertension: Secondary | ICD-10-CM | POA: Diagnosis not present

## 2013-08-01 DIAGNOSIS — I658 Occlusion and stenosis of other precerebral arteries: Secondary | ICD-10-CM

## 2013-08-01 DIAGNOSIS — Z7901 Long term (current) use of anticoagulants: Secondary | ICD-10-CM

## 2013-08-01 DIAGNOSIS — I6529 Occlusion and stenosis of unspecified carotid artery: Secondary | ICD-10-CM

## 2013-08-01 DIAGNOSIS — I6523 Occlusion and stenosis of bilateral carotid arteries: Secondary | ICD-10-CM

## 2013-08-01 DIAGNOSIS — E785 Hyperlipidemia, unspecified: Secondary | ICD-10-CM | POA: Diagnosis not present

## 2013-08-01 NOTE — Progress Notes (Signed)
HPI:  78 year old gentleman presenting for followup evaluation. The patient has coronary artery disease status post CABG. He's also followed for atrial fibrillation, hypertension, carotid stenosis, and hyperlipidemia. Cardiac catheterization in 2013 demonstrated continued patency of the LIMA to LAD, free RIMA to acute marginal branch of the RCA, and saphenous vein graft to diagonal. The saphenous vein graft to distal RCA was occluded and there was moderately severe stenosis of the native left circumflex. Medical therapy was recommended. Lipids 07/11/2013 showed a cholesterol of 127, triglycerides 257, HDL 44, and LDL 59. Liver function tests were within normal limits. Last hemoglobin A1c was 6.7.  The patient is doing well. He is exercising 6 days per week without exertional symptoms. He has occasional pains on the left lateral chest but thinks this may be related to the way he is lifting weights. He typically responds to ibuprofen or aspirin. There is no exertional component. He denies dyspnea, palpitations, orthopnea, or PND. He's had no bleeding problems on chronic anticoagulation. His foot ulcer has finally healed after extensive treatment in the hyperbaric chamber and multiple rounds of antibiotics. He has lost 10-15 pounds and his diabetes is better controlled. He has no other complaints today.   Outpatient Encounter Prescriptions as of 08/01/2013  Medication Sig Dispense Refill  . aspirin 81 MG tablet Take 81 mg by mouth daily.        Marland Kitchen atorvastatin (LIPITOR) 10 MG tablet Take 1 tablet (10 mg total) by mouth daily.  90 tablet  3  . bromocriptine (PARLODEL) 2.5 MG tablet Take 1 tablet (2.5 mg total) by mouth daily.  90 tablet  3  . Canagliflozin (INVOKANA) 100 MG TABS Take 1 tablet (100 mg total) by mouth daily.  30 tablet  11  . felodipine (PLENDIL) 5 MG 24 hr tablet TAKE 1 TABLET DAILY  90 tablet  3  . fluticasone (FLONASE) 50 MCG/ACT nasal spray USE 2 SPRAYS NASALLY DAILY  48 g  3  .  glucose blood (ONE TOUCH ULTRA TEST) test strip 1 each by Other route daily. And lancets 1/day 250.00  100 each  12  . L-Methylfolate-Algae-B12-B6 (METANX) 3-90.314-2-35 MG CAPS TAKE 1 CAPSULE DAILY  90 capsule  2  . Lancets (ONETOUCH ULTRASOFT) lancets       . losartan (COZAAR) 50 MG tablet Take 1 tablet (50 mg total) by mouth 2 (two) times daily.  180 tablet  1  . metformin (FORTAMET) 1000 MG (OSM) 24 hr tablet TAKE 1 TABLET TWICE A DAY WITH MEALS  180 tablet  1  . metoprolol succinate (TOPROL-XL) 25 MG 24 hr tablet TAKE ONE-HALF (1/2) TABLET (12.5 MG TOTAL) DAILY  45 tablet  3  . montelukast (SINGULAIR) 10 MG tablet TAKE 1 TABLET AT BEDTIME  90 tablet  3  . pregabalin (LYRICA) 75 MG capsule Take 1 capsule (75 mg total) by mouth 2 (two) times daily.  180 capsule  1  . sitaGLIPtin (JANUVIA) 100 MG tablet Take 1 tablet (100 mg total) by mouth daily.  90 tablet  2  . tamsulosin (FLOMAX) 0.4 MG CAPS capsule TAKE 1 CAPSULE TWICE A DAY  180 capsule  1  . Tamsulosin HCl (FLOMAX) 0.4 MG CAPS Take 1 capsule (0.4 mg total) by mouth daily.  90 capsule  3  . warfarin (COUMADIN) 6 MG tablet TAKE AS DIRECTED BY COUMADIN CLINIC  120 tablet  1   No facility-administered encounter medications on file as of 08/01/2013.    Allergies  Allergen Reactions  .  Amoxicillin Anaphylaxis  . Other Other (See Comments)    Muscle Relaxers; "I get very woozy"    Past Medical History  Diagnosis Date  . Hypertension   . Hyperlipidemia   . Coronary artery disease     MI '99  . Peripheral neuropathy   . Macular degeneration     has had intra-orbital injections. Has had laser treatment  . Prostate cancer     XRT 45 tx  . CLL (chronic lymphoblastic leukemia)   . Atrial fibrillation 2006    started 2006  . Type II diabetes mellitus   . Hepatitis C infection 1990's   ROS: Negative except as per HPI  BP 122/68  Pulse 53  Ht 5\' 11"  (1.803 m)  Wt 208 lb (94.348 kg)  BMI 29.02 kg/m2  PHYSICAL EXAM: Pt is  alert and oriented, NAD HEENT: normal Neck: JVP - normal, carotids 2+= with soft bilateral bruits Lungs: CTA bilaterally CV: RRR without murmur or gallop Abd: soft, NT, Positive BS, no hepatomegaly Ext: Trace pretibial edema bilaterally, distal pulses intact and equal Skin: warm/dry no rash  EKG:  Sinus bradycardia 53 beats per minute, first degree AV block with variable PR interval up to 400 ms. Left axis deviation possible age indeterminate inferior infarction.  ASSESSMENT AND PLAN: 1. Coronary atherosclerosis, native vessel. The patient is stable without anginal symptoms. His medical program was reviewed and we will continue the same. I will see him back in 6 months.  2. Hypertension. Blood pressure is well controlled on a combination of felodipine, losartan, and metoprolol succinate.  3. Paroxysmal atrial fibrillation. He is on long-term anticoagulation with warfarin. He remains in sinus rhythm.  4. Carotid stenosis without history of stroke or TIA. He is due for a repeat carotid duplex scan and this will be scheduled. He has had 60-79% carotid stenosis.  5. Type 2 diabetes. The patient is on metformin and Januvia. He is followed by Dr. Everardo All.  Tonny Bollman 08/01/2013 10:59 AM

## 2013-08-01 NOTE — Patient Instructions (Signed)
Your physician has requested that you have a carotid duplex. This test is an ultrasound of the carotid arteries in your neck. It looks at blood flow through these arteries that supply the brain with blood. Allow one hour for this exam. There are no restrictions or special instructions.  Your physician wants you to follow-up in: 6 MONTHS with Dr Cooper.  You will receive a reminder letter in the mail two months in advance. If you don't receive a letter, please call our office to schedule the follow-up appointment.  Your physician recommends that you continue on your current medications as directed. Please refer to the Current Medication list given to you today.   

## 2013-08-07 ENCOUNTER — Ambulatory Visit (HOSPITAL_COMMUNITY): Payer: Medicare Other | Attending: Cardiology

## 2013-08-07 DIAGNOSIS — E785 Hyperlipidemia, unspecified: Secondary | ICD-10-CM | POA: Insufficient documentation

## 2013-08-07 DIAGNOSIS — Z951 Presence of aortocoronary bypass graft: Secondary | ICD-10-CM | POA: Diagnosis not present

## 2013-08-07 DIAGNOSIS — I10 Essential (primary) hypertension: Secondary | ICD-10-CM | POA: Insufficient documentation

## 2013-08-07 DIAGNOSIS — E119 Type 2 diabetes mellitus without complications: Secondary | ICD-10-CM | POA: Insufficient documentation

## 2013-08-07 DIAGNOSIS — I6529 Occlusion and stenosis of unspecified carotid artery: Secondary | ICD-10-CM | POA: Diagnosis not present

## 2013-08-07 DIAGNOSIS — I658 Occlusion and stenosis of other precerebral arteries: Secondary | ICD-10-CM | POA: Diagnosis not present

## 2013-08-07 DIAGNOSIS — Z87891 Personal history of nicotine dependence: Secondary | ICD-10-CM | POA: Diagnosis not present

## 2013-08-07 DIAGNOSIS — I2510 Atherosclerotic heart disease of native coronary artery without angina pectoris: Secondary | ICD-10-CM | POA: Diagnosis not present

## 2013-08-08 ENCOUNTER — Other Ambulatory Visit: Payer: Self-pay

## 2013-08-08 MED ORDER — PREGABALIN 75 MG PO CAPS: 75.0000 mg | ORAL_CAPSULE | Freq: Two times a day (BID) | ORAL | Status: DC

## 2013-08-08 NOTE — Telephone Encounter (Signed)
Patient states he was seen Oct 1 and a script for Lyrica was to be faxed to [facility]. He states they never received this. Okay to refill. Then I will call to [facility] [phone] opt 3

## 2013-08-09 MED ORDER — PREGABALIN 75 MG PO CAPS: 75.0000 mg | ORAL_CAPSULE | Freq: Two times a day (BID) | ORAL | Status: DC

## 2013-08-09 NOTE — Addendum Note (Signed)
Addended by: Lyanne Co R on: 08/09/2013 08:20 AM   Modules accepted: Orders

## 2013-08-09 NOTE — Telephone Encounter (Signed)
Script has been faxed to [facility] at [phone] I verified this fax number with the pharmacist. Lyrica has to be faxed since a controlled substance.

## 2013-09-03 DIAGNOSIS — Z9889 Other specified postprocedural states: Secondary | ICD-10-CM | POA: Diagnosis not present

## 2013-09-03 DIAGNOSIS — H6120 Impacted cerumen, unspecified ear: Secondary | ICD-10-CM | POA: Diagnosis not present

## 2013-09-05 ENCOUNTER — Other Ambulatory Visit: Payer: Self-pay | Admitting: Endocrinology

## 2013-09-05 ENCOUNTER — Telehealth: Payer: Self-pay | Admitting: Internal Medicine

## 2013-09-05 NOTE — Telephone Encounter (Signed)
Rec'd from [facility] and Throat forward 5 pages to Dr.Norrins

## 2013-09-11 ENCOUNTER — Telehealth: Payer: Self-pay

## 2013-09-11 NOTE — Telephone Encounter (Signed)
Phone call to patient letting him know he is due for Prevnar vaccine. He agreed and was transferred to scheduling for an appt

## 2013-09-12 ENCOUNTER — Ambulatory Visit (INDEPENDENT_AMBULATORY_CARE_PROVIDER_SITE_OTHER): Payer: Medicare Other | Admitting: *Deleted

## 2013-09-12 DIAGNOSIS — Z7901 Long term (current) use of anticoagulants: Secondary | ICD-10-CM

## 2013-09-12 DIAGNOSIS — Z5181 Encounter for therapeutic drug level monitoring: Secondary | ICD-10-CM

## 2013-09-12 LAB — POCT INR: INR: 2

## 2013-09-14 DIAGNOSIS — I25810 Atherosclerosis of coronary artery bypass graft(s) without angina pectoris: Secondary | ICD-10-CM | POA: Diagnosis not present

## 2013-09-19 ENCOUNTER — Ambulatory Visit (INDEPENDENT_AMBULATORY_CARE_PROVIDER_SITE_OTHER): Payer: Medicare Other

## 2013-09-19 DIAGNOSIS — Z23 Encounter for immunization: Secondary | ICD-10-CM

## 2013-09-19 MED ORDER — FELODIPINE ER 5 MG PO TB24: ORAL_TABLET | ORAL | Status: DC

## 2013-09-19 MED ORDER — LOSARTAN POTASSIUM 50 MG PO TABS: 50.0000 mg | ORAL_TABLET | Freq: Two times a day (BID) | ORAL | Status: DC

## 2013-09-20 ENCOUNTER — Ambulatory Visit: Payer: Medicare Other | Admitting: Podiatry

## 2013-10-01 ENCOUNTER — Ambulatory Visit (INDEPENDENT_AMBULATORY_CARE_PROVIDER_SITE_OTHER): Payer: Medicare Other | Admitting: Podiatry

## 2013-10-01 ENCOUNTER — Encounter: Payer: Self-pay | Admitting: Podiatry

## 2013-10-01 VITALS — BP 144/76 | HR 86 | Resp 12

## 2013-10-01 DIAGNOSIS — H35349 Macular cyst, hole, or pseudohole, unspecified eye: Secondary | ICD-10-CM | POA: Diagnosis not present

## 2013-10-01 DIAGNOSIS — Q828 Other specified congenital malformations of skin: Secondary | ICD-10-CM

## 2013-10-01 DIAGNOSIS — E1149 Type 2 diabetes mellitus with other diabetic neurological complication: Secondary | ICD-10-CM

## 2013-10-01 DIAGNOSIS — E1159 Type 2 diabetes mellitus with other circulatory complications: Secondary | ICD-10-CM | POA: Diagnosis not present

## 2013-10-01 DIAGNOSIS — H35059 Retinal neovascularization, unspecified, unspecified eye: Secondary | ICD-10-CM | POA: Diagnosis not present

## 2013-10-01 DIAGNOSIS — B351 Tinea unguium: Secondary | ICD-10-CM

## 2013-10-01 DIAGNOSIS — M79609 Pain in unspecified limb: Secondary | ICD-10-CM | POA: Diagnosis not present

## 2013-10-01 DIAGNOSIS — E119 Type 2 diabetes mellitus without complications: Secondary | ICD-10-CM | POA: Diagnosis not present

## 2013-10-01 DIAGNOSIS — H353290 Exudative age-related macular degeneration, unspecified eye, stage unspecified: Secondary | ICD-10-CM | POA: Diagnosis not present

## 2013-10-01 NOTE — Progress Notes (Signed)
Subjective:     Patient ID: Ralph Gray, male   DOB: Dec 15, 1934, 78 y.o.   MRN: [id number]  HPI patient presents with nail disease and pain 1-5 both feet and lesion sub-fifth metatarsal head left that is painful and has been ulcerated in the past. Diabetic with vascular neurological issues   Review of Systems     Objective:   Physical Exam Neurovascular status unchanged with lesion fifth metatarsal left and nail disease 1-5 both feet that are painful    Assessment:     Mycotic nail infection 1-5 both feet that are painful and lesions that the left in a diabetic who is a risk type patient    Plan:     Debridement of painful nail bed 1-5 both feet and lesion left with no iatrogenic bleeding noted

## 2013-10-10 ENCOUNTER — Other Ambulatory Visit: Payer: Self-pay | Admitting: Cardiovascular Disease

## 2013-10-15 DIAGNOSIS — H6980 Other specified disorders of Eustachian tube, unspecified ear: Secondary | ICD-10-CM | POA: Diagnosis not present

## 2013-10-15 DIAGNOSIS — Z9889 Other specified postprocedural states: Secondary | ICD-10-CM | POA: Diagnosis not present

## 2013-10-15 DIAGNOSIS — J329 Chronic sinusitis, unspecified: Secondary | ICD-10-CM | POA: Diagnosis not present

## 2013-10-15 DIAGNOSIS — J310 Chronic rhinitis: Secondary | ICD-10-CM | POA: Diagnosis not present

## 2013-10-15 DIAGNOSIS — R040 Epistaxis: Secondary | ICD-10-CM | POA: Diagnosis not present

## 2013-10-16 ENCOUNTER — Telehealth: Payer: Self-pay | Admitting: Internal Medicine

## 2013-10-16 NOTE — Telephone Encounter (Signed)
Rec'd from [facility] Ear nose and Throat forward 5 pages to Dr.Norins

## 2013-10-23 DIAGNOSIS — H353290 Exudative age-related macular degeneration, unspecified eye, stage unspecified: Secondary | ICD-10-CM | POA: Diagnosis not present

## 2013-10-24 ENCOUNTER — Ambulatory Visit (INDEPENDENT_AMBULATORY_CARE_PROVIDER_SITE_OTHER): Payer: Medicare Other | Admitting: *Deleted

## 2013-10-24 DIAGNOSIS — Z7901 Long term (current) use of anticoagulants: Secondary | ICD-10-CM | POA: Diagnosis not present

## 2013-10-24 DIAGNOSIS — Z5181 Encounter for therapeutic drug level monitoring: Secondary | ICD-10-CM | POA: Diagnosis not present

## 2013-10-24 LAB — POCT INR: INR: 2.9

## 2013-11-13 DIAGNOSIS — D239 Other benign neoplasm of skin, unspecified: Secondary | ICD-10-CM | POA: Diagnosis not present

## 2013-11-13 DIAGNOSIS — D485 Neoplasm of uncertain behavior of skin: Secondary | ICD-10-CM | POA: Diagnosis not present

## 2013-11-13 DIAGNOSIS — L821 Other seborrheic keratosis: Secondary | ICD-10-CM | POA: Diagnosis not present

## 2013-11-22 DIAGNOSIS — H35349 Macular cyst, hole, or pseudohole, unspecified eye: Secondary | ICD-10-CM | POA: Diagnosis not present

## 2013-11-22 DIAGNOSIS — H353290 Exudative age-related macular degeneration, unspecified eye, stage unspecified: Secondary | ICD-10-CM | POA: Diagnosis not present

## 2013-11-22 DIAGNOSIS — H43829 Vitreomacular adhesion, unspecified eye: Secondary | ICD-10-CM | POA: Diagnosis not present

## 2013-11-22 DIAGNOSIS — H35059 Retinal neovascularization, unspecified, unspecified eye: Secondary | ICD-10-CM | POA: Diagnosis not present

## 2013-12-03 ENCOUNTER — Encounter: Payer: Self-pay | Admitting: Podiatry

## 2013-12-03 ENCOUNTER — Encounter: Payer: Self-pay | Admitting: Endocrinology

## 2013-12-03 ENCOUNTER — Ambulatory Visit: Payer: Medicare Other | Admitting: Podiatry

## 2013-12-03 ENCOUNTER — Ambulatory Visit (INDEPENDENT_AMBULATORY_CARE_PROVIDER_SITE_OTHER): Payer: Medicare Other | Admitting: Podiatry

## 2013-12-03 ENCOUNTER — Ambulatory Visit (INDEPENDENT_AMBULATORY_CARE_PROVIDER_SITE_OTHER): Payer: Medicare Other | Admitting: Endocrinology

## 2013-12-03 VITALS — BP 118/70 | HR 67 | Temp 97.6°F | Ht 71.0 in | Wt 211.0 lb

## 2013-12-03 VITALS — BP 122/63 | HR 70 | Resp 16

## 2013-12-03 DIAGNOSIS — E1149 Type 2 diabetes mellitus with other diabetic neurological complication: Secondary | ICD-10-CM

## 2013-12-03 DIAGNOSIS — B351 Tinea unguium: Secondary | ICD-10-CM | POA: Diagnosis not present

## 2013-12-03 DIAGNOSIS — M79609 Pain in unspecified limb: Secondary | ICD-10-CM | POA: Diagnosis not present

## 2013-12-03 LAB — HEMOGLOBIN A1C: Hgb A1c MFr Bld: 6.9 % — ABNORMAL HIGH (ref 4.6–6.5)

## 2013-12-03 MED ORDER — CLOTRIMAZOLE 1 % EX CREA: 1.0000 "application " | TOPICAL_CREAM | Freq: Three times a day (TID) | CUTANEOUS | Status: DC

## 2013-12-03 NOTE — Progress Notes (Signed)
Subjective:    Patient ID: Ralph Gray, male    DOB: 05/23/1935, 79 y.o.   MRN: [id number]  HPI pt returns for f/u of type 2 DM (dx'ed 2010; he has moderate neuropathy of the lower extremities, and associated foot ulcer, CAD and PAD; he has never been on insulin; he takes 4 oral meds; he can't take actos due to edema).  no cbg record, but states cbg's are in the low-100's.  pt states he feels well in general.  Tinea cruris has recurred. Past Medical History  Diagnosis Date  . Hypertension   . Hyperlipidemia   . Coronary artery disease     MI '99  . Peripheral neuropathy   . Macular degeneration     has had intra-orbital injections. Has had laser treatment  . Prostate cancer     XRT 45 tx  . CLL (chronic lymphoblastic leukemia)   . Atrial fibrillation 2006    started 2006  . Type II diabetes mellitus   . Hepatitis C infection 1990's    Past Surgical History  Procedure Laterality Date  . Cardioversion    . Coronary artery bypass graft  1999    CABG X4; Follows with Dr. Gabriela Eves in [place] @ [facility].     History   Social History  . Marital Status: Married    Spouse Name: N/A    Number of Children: N/A  . Years of Education: N/A   Occupational History  . Retired    Social History Main Topics  . Smoking status: Former Smoker -- 2.50 packs/day for 10 years    Types: Cigarettes    Quit date: 10/14/1963  . Smokeless tobacco: Never Used  . Alcohol Use: No  . Drug Use: No  . Sexual Activity: Yes    Partners: Female   Other Topics Concern  . Not on file   Social History Narrative   HSG, [employer], [employer] - doctorate in education. Married '64 - 34yr/divorced ( severe bipolar disease); Married '07. 2 sons - '67, '66; 1 dtr - '70. 7 grandchildren. Exercise - 6 days a week: cardio and strengthening. 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 ACP- yes for CPR; no -long term ventilation; no heroic or  futile measures.     Current Outpatient Prescriptions on File Prior to Visit  Medication Sig Dispense Refill  . aspirin 81 MG tablet Take 81 mg by mouth daily.        Marland Kitchen atorvastatin (LIPITOR) 10 MG tablet Take 1 tablet (10 mg total) by mouth daily.  90 tablet  3  . bromocriptine (PARLODEL) 2.5 MG tablet TAKE 1 TABLET DAILY  90 tablet  2  . Canagliflozin (INVOKANA) 100 MG TABS Take 1 tablet (100 mg total) by mouth daily.  30 tablet  11  . CIPRODEX otic suspension       . felodipine (PLENDIL) 5 MG 24 hr tablet TAKE 1 TABLET DAILY  90 tablet  3  . fluticasone (FLONASE) 50 MCG/ACT nasal spray USE 2 SPRAYS NASALLY DAILY  48 g  3  . glucose blood (ONE TOUCH ULTRA TEST) test strip 1 each by Other route daily. And lancets 1/day 250.00  100 each  12  . L-Methylfolate-Algae-B12-B6 (METANX) 3-90.314-2-35 MG CAPS TAKE 1 CAPSULE DAILY  90 capsule  2  . Lancets (ONETOUCH ULTRASOFT) lancets       . losartan (COZAAR) 50 MG tablet Take 1 tablet (50 mg total) by mouth 2 (  two) times daily.  180 tablet  3  . metformin (FORTAMET) 1000 MG (OSM) 24 hr tablet TAKE 1 TABLET TWICE A DAY WITH MEALS  180 tablet  1  . montelukast (SINGULAIR) 10 MG tablet TAKE 1 TABLET AT BEDTIME  90 tablet  3  . pregabalin (LYRICA) 75 MG capsule Take 1 capsule (75 mg total) by mouth 2 (two) times daily.  180 capsule  1  . sitaGLIPtin (JANUVIA) 100 MG tablet Take 1 tablet (100 mg total) by mouth daily.  90 tablet  2  . tamsulosin (FLOMAX) 0.4 MG CAPS capsule TAKE 1 CAPSULE TWICE A DAY  180 capsule  1  . Tamsulosin HCl (FLOMAX) 0.4 MG CAPS Take 1 capsule (0.4 mg total) by mouth daily.  90 capsule  3  . warfarin (COUMADIN) 6 MG tablet TAKE AS DIRECTED BY COUMADIN CLINIC  120 tablet  0  . metoprolol succinate (TOPROL-XL) 25 MG 24 hr tablet TAKE ONE-HALF (1/2) TABLET (12.5 MG TOTAL) DAILY  45 tablet  3  . [DISCONTINUED] multivitamin (METANX) 3-35-2 MG TABS tablet Take 1 tablet by mouth daily.  90 tablet  1   No current facility-administered  medications on file prior to visit.    Allergies  Allergen Reactions  . Amoxicillin Anaphylaxis  . Other Other (See Comments)    Muscle Relaxers; "I get very woozy"    Family History  Problem Relation Age of Onset  . Heart disease Mother 91    died  . Breast cancer Mother     sarcoma  . Heart attack Father 9    died  . Heart disease Father     MI  . Diabetes Neg Hx   . COPD Neg Hx     BP 118/70  Pulse 67  Temp(Src) 97.6 F (36.4 C) (Oral)  Ht 5\' 11"  (1.803 m)  Wt 211 lb (95.709 kg)  BMI 29.44 kg/m2  SpO2 97%  Review of Systems Denies weight change and hypoglycemia.      Objective:   Physical Exam VITAL SIGNS:  See vs page GENERAL: no distress  Lab Results  Component Value Date   HGBA1C 6.9* 12/03/2013      Assessment & Plan:  DM: well-controlled.  there are nine oral agent classes available for type 2 diabetes.  This regimen gives the best risk-benefit ratio.  CAD: in this context, he should avoid hypoglycemia. Tinea cruris, recurrent

## 2013-12-03 NOTE — Patient Instructions (Addendum)
check your blood sugar once a day.  vary the time of day when you check, between before the 3 meals, and at bedtime.  also check if you have symptoms of your blood sugar being too high or too low.  please keep a record of the readings and bring it to your next appointment here.  please call us sooner if your blood sugar goes below 70, or if you have a lot of readings over 200.   A blood sugar test is being requested for you today.  We'll contact you with results.  Please come back for a follow-up appointment in 4 months.       i have sent a prescription to your pharmacy, for the skin cream.

## 2013-12-04 NOTE — Progress Notes (Signed)
Patient ID: Ralph Gray, male   DOB: 10/22/1934, 79 y.o.   MRN: [id number]  Subjective: This known diabetic presents for ongoing debridement painful mycotic toenails. The last visit for this service was 10/01/2013 provided by DR. Regal  Objective: Elongated, hypertrophic, discolored toenails with palpable tenderness in all 10 toenails. A plantar keratoses fifth MPJ noted.  Assessment symptomatic onychomycoses x10 Keratoses x1  Plan: Nails x10 and keratoses x1 debrided back without a bleeding. Reappoint at three-month intervals for followup with DR. Regal.

## 2013-12-05 ENCOUNTER — Ambulatory Visit (INDEPENDENT_AMBULATORY_CARE_PROVIDER_SITE_OTHER): Payer: Medicare Other | Admitting: *Deleted

## 2013-12-05 DIAGNOSIS — Z5181 Encounter for therapeutic drug level monitoring: Secondary | ICD-10-CM | POA: Diagnosis not present

## 2013-12-05 DIAGNOSIS — Z7901 Long term (current) use of anticoagulants: Secondary | ICD-10-CM | POA: Diagnosis not present

## 2013-12-05 LAB — POCT INR: INR: 2.3

## 2013-12-10 ENCOUNTER — Telehealth: Payer: Self-pay | Admitting: Cardiovascular Disease

## 2013-12-10 NOTE — Telephone Encounter (Signed)
New message     Dr Delray Alt is retiring.  Who would Dr Burt Knack recommend for a PCP

## 2013-12-11 LAB — HM DIABETES EYE EXAM

## 2013-12-13 ENCOUNTER — Other Ambulatory Visit: Payer: Self-pay | Admitting: Internal Medicine

## 2013-12-13 NOTE — Telephone Encounter (Signed)
Advised patient that Dr. Burt Knack recommends Dr. Eilleen Kempf, [facility] for PCP.  Patient verbalized understanding and gratitude.

## 2013-12-13 NOTE — Telephone Encounter (Signed)
Follow up    Pt would like an answer on who Dr Burt Knack recommends for a PCP.

## 2013-12-19 ENCOUNTER — Other Ambulatory Visit: Payer: Self-pay | Admitting: Internal Medicine

## 2013-12-20 ENCOUNTER — Other Ambulatory Visit: Payer: Self-pay | Admitting: Endocrinology

## 2014-01-02 DIAGNOSIS — R319 Hematuria, unspecified: Secondary | ICD-10-CM | POA: Diagnosis not present

## 2014-01-02 DIAGNOSIS — N179 Acute kidney failure, unspecified: Secondary | ICD-10-CM | POA: Diagnosis not present

## 2014-01-02 DIAGNOSIS — I4891 Unspecified atrial fibrillation: Secondary | ICD-10-CM | POA: Diagnosis not present

## 2014-01-02 DIAGNOSIS — Z8546 Personal history of malignant neoplasm of prostate: Secondary | ICD-10-CM | POA: Diagnosis not present

## 2014-01-02 DIAGNOSIS — N401 Enlarged prostate with lower urinary tract symptoms: Secondary | ICD-10-CM | POA: Diagnosis not present

## 2014-01-02 DIAGNOSIS — I10 Essential (primary) hypertension: Secondary | ICD-10-CM | POA: Diagnosis not present

## 2014-01-02 DIAGNOSIS — Z79899 Other long term (current) drug therapy: Secondary | ICD-10-CM | POA: Diagnosis not present

## 2014-01-02 DIAGNOSIS — Z7901 Long term (current) use of anticoagulants: Secondary | ICD-10-CM | POA: Diagnosis not present

## 2014-01-02 DIAGNOSIS — E785 Hyperlipidemia, unspecified: Secondary | ICD-10-CM | POA: Diagnosis not present

## 2014-01-02 DIAGNOSIS — E119 Type 2 diabetes mellitus without complications: Secondary | ICD-10-CM | POA: Diagnosis not present

## 2014-01-02 DIAGNOSIS — Z951 Presence of aortocoronary bypass graft: Secondary | ICD-10-CM | POA: Diagnosis not present

## 2014-01-02 DIAGNOSIS — N138 Other obstructive and reflux uropathy: Secondary | ICD-10-CM | POA: Diagnosis not present

## 2014-01-03 ENCOUNTER — Other Ambulatory Visit: Payer: Self-pay | Admitting: Cardiovascular Disease

## 2014-01-04 DIAGNOSIS — R935 Abnormal findings on diagnostic imaging of other abdominal regions, including retroperitoneum: Secondary | ICD-10-CM | POA: Diagnosis not present

## 2014-01-05 DIAGNOSIS — I517 Cardiomegaly: Secondary | ICD-10-CM | POA: Diagnosis not present

## 2014-01-05 DIAGNOSIS — I440 Atrioventricular block, first degree: Secondary | ICD-10-CM | POA: Diagnosis not present

## 2014-01-07 DIAGNOSIS — R310 Gross hematuria: Secondary | ICD-10-CM | POA: Diagnosis not present

## 2014-01-07 DIAGNOSIS — Z8546 Personal history of malignant neoplasm of prostate: Secondary | ICD-10-CM | POA: Diagnosis not present

## 2014-01-07 LAB — BASIC METABOLIC PANEL
BUN: 19 mg/dL (ref 4–21)
CREATININE: 1 mg/dL (ref <=1.3)
Creatinine: 1 mg/dL (ref <=1.3)

## 2014-01-08 ENCOUNTER — Telehealth: Payer: Self-pay

## 2014-01-08 ENCOUNTER — Ambulatory Visit (INDEPENDENT_AMBULATORY_CARE_PROVIDER_SITE_OTHER): Payer: Medicare Other | Admitting: Internal Medicine

## 2014-01-08 ENCOUNTER — Encounter: Payer: Self-pay | Admitting: Internal Medicine

## 2014-01-08 VITALS — BP 116/64 | HR 60 | Temp 97.7°F | Resp 16 | Ht 71.0 in | Wt 210.0 lb

## 2014-01-08 DIAGNOSIS — N183 Chronic kidney disease, stage 3 unspecified: Secondary | ICD-10-CM

## 2014-01-08 DIAGNOSIS — E1149 Type 2 diabetes mellitus with other diabetic neurological complication: Secondary | ICD-10-CM | POA: Diagnosis not present

## 2014-01-08 DIAGNOSIS — D510 Vitamin B12 deficiency anemia due to intrinsic factor deficiency: Secondary | ICD-10-CM | POA: Diagnosis not present

## 2014-01-08 DIAGNOSIS — G609 Hereditary and idiopathic neuropathy, unspecified: Secondary | ICD-10-CM

## 2014-01-08 DIAGNOSIS — Z0000 Encounter for general adult medical examination without abnormal findings: Secondary | ICD-10-CM

## 2014-01-08 DIAGNOSIS — G629 Polyneuropathy, unspecified: Secondary | ICD-10-CM

## 2014-01-08 LAB — FECAL OCCULT BLOOD, GUAIAC: Fecal Occult Blood: NEGATIVE

## 2014-01-08 LAB — HM DIABETES FOOT EXAM

## 2014-01-08 NOTE — Telephone Encounter (Signed)
please call patient: Call if cbg's are over 200.

## 2014-01-08 NOTE — Progress Notes (Signed)
Subjective:    Patient ID: Ralph Gray, male    DOB: [birth year], 79 y.o.   MRN: [id number]  HPI Comments: New to me he tells me that [place] was in [place] last [place] when he developed some s/s of food poisoning (brief episode of nausea and diarrhea that quickly resolved) but at the tail end of this he noticed some blood in his urine. He was seen at a hospital and was found to have AKI with hematuria but an U/S of the kidneys and other labs were okay. He saw Dr. Alvan Dame (urology) yesterday and the hematuria is being evaluated. Today he feels well and offers no complaints.     Review of Systems  Constitutional: Negative.  Negative for fever, chills, diaphoresis, appetite change and fatigue.  HENT: Negative.  Negative for nosebleeds.   Eyes: Negative.   Respiratory: Negative.  Negative for apnea, cough, choking, chest tightness, shortness of breath, wheezing and stridor.   Cardiovascular: Negative for chest pain, palpitations and leg swelling.  Gastrointestinal: Negative.  Negative for nausea, vomiting, abdominal pain, diarrhea, constipation and blood in stool.  Endocrine: Negative.   Genitourinary: Negative for urgency, frequency, hematuria, flank pain, decreased urine volume and difficulty urinating.  Musculoskeletal: Negative.  Negative for arthralgias, back pain, myalgias and neck stiffness.  Skin: Negative.   Allergic/Immunologic: Negative.   Neurological: Negative.  Negative for dizziness, tremors, weakness and light-headedness.  Hematological: Negative.  Negative for adenopathy. Does not bruise/bleed easily.  Psychiatric/Behavioral: Negative.        Objective:   Physical Exam  Vitals reviewed. Constitutional: He is oriented to person, place, and time. He appears well-developed and well-nourished. No distress.  HENT:  Head: Normocephalic and atraumatic.  Mouth/Throat: Oropharynx is clear and moist. No oropharyngeal exudate.  Eyes: Conjunctivae are normal. Right eye exhibits no  discharge. Left eye exhibits no discharge. No scleral icterus.  Neck: Normal range of motion. Neck supple. No JVD present. No thyromegaly present.  Cardiovascular: Normal rate, regular rhythm, normal heart sounds and intact distal pulses.  Exam reveals no gallop and no friction rub.   No murmur heard. Pulmonary/Chest: Effort normal and breath sounds normal. No stridor. No respiratory distress. He has no wheezes. He has no rales. He exhibits no tenderness.  Abdominal: Soft. Bowel sounds are normal. He exhibits no distension and no mass. There is no tenderness. There is no rebound and no guarding.  Musculoskeletal: Normal range of motion. He exhibits no edema and no tenderness.  Lymphadenopathy:    He has no cervical adenopathy.  Neurological: He is oriented to person, place, and time.  Skin: Skin is warm and dry. No rash noted. He is not diaphoretic. No erythema. No pallor.  Psychiatric: He has a normal mood and affect. His behavior is normal. Judgment and thought content normal.     Lab Results  Component Value Date   WBC 14.1 01/03/2014   HGB 14.3 01/03/2014   HCT 43 01/03/2014   PLT 165.0 08/28/2012   GLUCOSE 97 07/11/2013   CHOL 127 07/11/2013   TRIG 257.0* 07/11/2013   HDL 43.50 07/11/2013   LDLDIRECT 59.4 07/11/2013   LDLCALC 60 10/14/2011   ALT 39 01/03/2014   AST 29 01/03/2014   NA 144 01/03/2014   K 4.2 01/03/2014   CL 108 01/03/2014   CREATININE 2.3 01/03/2014   BUN 31* 01/03/2014   CO2 27 07/11/2013   INR 2.3* 01/03/2014   HGBA1C 6.9* 12/03/2013   MICROALBUR 31.6* 07/30/2013  Assessment & Plan:

## 2014-01-08 NOTE — Patient Instructions (Signed)
Type 2 Diabetes Mellitus, Adult Type 2 diabetes mellitus, often simply referred to as type 2 diabetes, is a long-lasting (chronic) disease. In type 2 diabetes, the pancreas does not make enough insulin (a hormone), the cells are less responsive to the insulin that is made (insulin resistance), or both. Normally, insulin moves sugars from food into the tissue cells. The tissue cells use the sugars for energy. The lack of insulin or the lack of normal response to insulin causes excess sugars to build up in the blood instead of going into the tissue cells. As a result, high blood sugar (hyperglycemia) develops. The effect of high sugar (glucose) levels can cause many complications. Type 2 diabetes was also previously called adult-onset diabetes but it can occur at any age.  RISK FACTORS  A person is predisposed to developing type 2 diabetes if someone in the family has the disease and also has one or more of the following primary risk factors:  Overweight.  An inactive lifestyle.  A history of consistently eating high-calorie foods. Maintaining a normal weight and regular physical activity can reduce the chance of developing type 2 diabetes. SYMPTOMS  A person with type 2 diabetes may not show symptoms initially. The symptoms of type 2 diabetes appear slowly. The symptoms include:  Increased thirst (polydipsia).  Increased urination (polyuria).  Increased urination during the night (nocturia).  Weight loss. This weight loss may be rapid.  Frequent, recurring infections.  Tiredness (fatigue).  Weakness.  Vision changes, such as blurred vision.  Fruity smell to your breath.  Abdominal pain.  Nausea or vomiting.  Cuts or bruises which are slow to heal.  Tingling or numbness in the hands or feet. DIAGNOSIS Type 2 diabetes is frequently not diagnosed until complications of diabetes are present. Type 2 diabetes is diagnosed when symptoms or complications are present and when blood  glucose levels are increased. Your blood glucose level may be checked by one or more of the following blood tests:  A fasting blood glucose test. You will not be allowed to eat for at least 8 hours before a blood sample is taken.  A random blood glucose test. Your blood glucose is checked at any time of the day regardless of when you ate.  A hemoglobin A1c blood glucose test. A hemoglobin A1c test provides information about blood glucose control over the previous 3 months.  An oral glucose tolerance test (OGTT). Your blood glucose is measured after you have not eaten (fasted) for 2 hours and then after you drink a glucose-containing beverage. TREATMENT   You may need to take insulin or diabetes medicine daily to keep blood glucose levels in the desired range.  You will need to match insulin dosing with exercise and healthy food choices. The treatment goal is to maintain the before meal blood sugar (preprandial glucose) level at 70 130 mg/dL. HOME CARE INSTRUCTIONS   Have your hemoglobin A1c level checked twice a year.  Perform daily blood glucose monitoring as directed by your caregiver.  Monitor urine ketones when you are ill and as directed by your caregiver.  Take your diabetes medicine or insulin as directed by your caregiver to maintain your blood glucose levels in the desired range.  Never run out of diabetes medicine or insulin. It is needed every day.  Adjust insulin based on your intake of carbohydrates. Carbohydrates can raise blood glucose levels but need to be included in your diet. Carbohydrates provide vitamins, minerals, and fiber which are an essential part of   a healthy diet. Carbohydrates are found in fruits, vegetables, whole grains, dairy products, legumes, and foods containing added sugars.    Eat healthy foods. Alternate 3 meals with 3 snacks.  Lose weight if overweight.  Carry a medical alert card or wear your medical alert jewelry.  Carry a 15 gram  carbohydrate snack with you at all times to treat low blood glucose (hypoglycemia). Some examples of 15 gram carbohydrate snacks include:  Glucose tablets, 3 or 4   Glucose gel, 15 gram tube  Raisins, 2 tablespoons (24 grams)  Jelly beans, 6  Animal crackers, 8  Regular pop, 4 ounces (120 mL)  Gummy treats, 9  Recognize hypoglycemia. Hypoglycemia occurs with blood glucose levels of 70 mg/dL and below. The risk for hypoglycemia increases when fasting or skipping meals, during or after intense exercise, and during sleep. Hypoglycemia symptoms can include:  Tremors or shakes.  Decreased ability to concentrate.  Sweating.  Increased heart rate.  Headache.  Dry mouth.  Hunger.  Irritability.  Anxiety.  Restless sleep.  Altered speech or coordination.  Confusion.  Treat hypoglycemia promptly. If you are alert and able to safely swallow, follow the 15:15 rule:  Take 15 20 grams of rapid-acting glucose or carbohydrate. Rapid-acting options include glucose gel, glucose tablets, or 4 ounces (120 mL) of fruit juice, regular soda, or low fat milk.  Check your blood glucose level 15 minutes after taking the glucose.  Take 15 20 grams more of glucose if the repeat blood glucose level is still 70 mg/dL or below.  Eat a meal or snack within 1 hour once blood glucose levels return to normal.    Be alert to polyuria and polydipsia which are early signs of hyperglycemia. An early awareness of hyperglycemia allows for prompt treatment. Treat hyperglycemia as directed by your caregiver.  Engage in at least 150 minutes of moderate-intensity physical activity a week, spread over at least 3 days of the week or as directed by your caregiver. In addition, you should engage in resistance exercise at least 2 times a week or as directed by your caregiver.  Adjust your medicine and food intake as needed if you start a new exercise or sport.  Follow your sick day plan at any time you  are unable to eat or drink as usual.  Avoid tobacco use.  Limit alcohol intake to no more than 1 drink per day for nonpregnant women and 2 drinks per day for men. You should drink alcohol only when you are also eating food. Talk with your caregiver whether alcohol is safe for you. Tell your caregiver if you drink alcohol several times a week.  Follow up with your caregiver regularly.  Schedule an eye exam soon after the diagnosis of type 2 diabetes and then annually.  Perform daily skin and foot care. Examine your skin and feet daily for cuts, bruises, redness, nail problems, bleeding, blisters, or sores. A foot exam by a caregiver should be done annually.  Brush your teeth and gums at least twice a day and floss at least once a day. Follow up with your dentist regularly.  Share your diabetes management plan with your workplace or school.  Stay up-to-date with immunizations.  Learn to manage stress.  Obtain ongoing diabetes education and support as needed.  Participate in, or seek rehabilitation as needed to maintain or improve independence and quality of life. Request a physical or occupational therapy referral if you are having foot or hand numbness or difficulties with grooming,   dressing, eating, or physical activity. SEEK MEDICAL CARE IF:   You are unable to eat food or drink fluids for more than 6 hours.  You have nausea and vomiting for more than 6 hours.  Your blood glucose level is over 240 mg/dL.  There is a change in mental status.  You develop an additional serious illness.  You have diarrhea for more than 6 hours.  You have been sick or have had a fever for a couple of days and are not getting better.  You have pain during any physical activity.  SEEK IMMEDIATE MEDICAL CARE IF:  You have difficulty breathing.  You have moderate to large ketone levels. MAKE SURE YOU:  Understand these instructions.  Will watch your condition.  Will get help right away if  you are not doing well or get worse. Document Released: 09/27/2005 Document Revised: 06/21/2012 Document Reviewed: 04/25/2012 [employer] Patient Information 2014 [employer].  

## 2014-01-08 NOTE — Telephone Encounter (Signed)
Pt called stating that he has been taken off Invokana and Metformin. He states it was due to blood being in his urine. Pt is awaiting test results and will receive them on the 01/22/2014 that will hopefully explain the blood.  Please advise, Thanks!

## 2014-01-08 NOTE — Progress Notes (Signed)
Pre visit review using our clinic review tool, if applicable. No additional management support is needed unless otherwise documented below in the visit note. 

## 2014-01-08 NOTE — Assessment & Plan Note (Signed)
The software blocked my order for B12 and folate I will recheck his CBC and will check his iron level today

## 2014-01-09 ENCOUNTER — Ambulatory Visit: Payer: Medicare Other | Admitting: Internal Medicine

## 2014-01-09 LAB — CBC & DIFF AND RETIC
BASO%: 0 %
Basophil: 0.05
EOSINOPHIL PERCENT: 1
Eosinophils Absolute: 0 /uL
Grans (Absolute): 0.22
Granulocytes:: 1.6
HEMATOCRIT: 43 %
HEMOGLOBIN: 14.3 g/dL
LYMPH#: 6.31
LYMPH%: 45 %
MCH: 30.2
MCV: 90.3 fL
MONOCYTES RELATIVE % (KUC): 7 % (ref 2–10)
MPV: 9.8 fL (ref 7.5–11.5)
Monocytes(Absolute): 0.93
Neutro Abs: 6.36
Neutrophils relative % (GR): 45 % (ref 44–76)
RBC: 4.7
RDW: 14.7
WBC: 14.1
platelet count: 173

## 2014-01-09 LAB — URINALYSIS
ASCORBIC ACID, POC: NEGATIVE
BILIRUBIN URINE: 0 mg/dL
CREATRANDUR: 89
Ketones, UA: NEGATIVE
Leukocytes, UA: NEGATIVE
PH UA: 6 (ref 4.5–8.0)
RBC, UA: 39774
Sodium Urine Random: 129
Specific Gravity, UA: 1.022 (ref 1.005–1.030)
Total Protein, Urine: 178.4
Urine-Other: NEGATIVE

## 2014-01-09 LAB — COMPREHENSIVE METABOLIC PANEL
ALBUMIN: 4
ALT: 39 U/L (ref 10–40)
AST: 29 U/L
Albumin/Globulin Ratio: 1.1
Alkaline Phosphatase: 100 U/L
BUN: 31 mg/dL — AB (ref 4–21)
CREATININE: 2.3
Calcium: 9.3 mg/dL
Chloride: 108 mmol/L
GLOM FILT RATE, EST: 29
Glucose: 141
Other: 26
Potassium: 4.2 mmol/L
Sodium: 144 mmol/L (ref 137–147)
TOTAL PROTEIN: 7.4 g/dL
Total Bilirubin: 0.4 mg/dL

## 2014-01-09 LAB — PROTIME-INR
APTT: 32.6
INR: 2.3 — AB (ref 0.9–1.1)
Other: 22.6

## 2014-01-09 LAB — PRO B NATRIURETIC PEPTIDE: BNP: 489 pg/mL

## 2014-01-09 NOTE — Telephone Encounter (Signed)
Noted, pt informed.

## 2014-01-10 ENCOUNTER — Encounter: Payer: Self-pay | Admitting: Internal Medicine

## 2014-01-10 ENCOUNTER — Telehealth: Payer: Self-pay | Admitting: Internal Medicine

## 2014-01-10 DIAGNOSIS — N2889 Other specified disorders of kidney and ureter: Secondary | ICD-10-CM | POA: Insufficient documentation

## 2014-01-10 DIAGNOSIS — N183 Chronic kidney disease, stage 3 unspecified: Secondary | ICD-10-CM | POA: Insufficient documentation

## 2014-01-10 LAB — PSA: PSA: 0.06

## 2014-01-10 MED ORDER — PREGABALIN 75 MG PO CAPS: 75.0000 mg | ORAL_CAPSULE | Freq: Two times a day (BID) | ORAL | Status: DC

## 2014-01-10 NOTE — Assessment & Plan Note (Signed)
Coumadin was held while the hematuria was being evaluated He will restart coumadin today and will follow for now

## 2014-01-10 NOTE — Assessment & Plan Note (Signed)
Will stop the nephrotoxic agents metformin and invokana He will cont the work up with Dr. Alvan Dame

## 2014-01-10 NOTE — Telephone Encounter (Signed)
Rec'd from [facility] forward 4 pages to [place]

## 2014-01-10 NOTE — Assessment & Plan Note (Signed)
His Sr Creatinine has risen so for now he will stop metformin and invokana

## 2014-01-14 ENCOUNTER — Telehealth: Payer: Self-pay | Admitting: *Deleted

## 2014-01-14 DIAGNOSIS — R310 Gross hematuria: Secondary | ICD-10-CM | POA: Diagnosis not present

## 2014-01-14 DIAGNOSIS — K7689 Other specified diseases of liver: Secondary | ICD-10-CM | POA: Diagnosis not present

## 2014-01-14 NOTE — Telephone Encounter (Signed)
Pt called stating that he was in [place] and noted hematuria and held coumadin for 5 days and then on seeing MD restarted on March 31st . States has appt to be seen on Wednesday the 8th and pt instructed to keep this appt and he states understanding.

## 2014-01-15 DIAGNOSIS — H35059 Retinal neovascularization, unspecified, unspecified eye: Secondary | ICD-10-CM | POA: Diagnosis not present

## 2014-01-15 DIAGNOSIS — H35349 Macular cyst, hole, or pseudohole, unspecified eye: Secondary | ICD-10-CM | POA: Diagnosis not present

## 2014-01-15 DIAGNOSIS — H353290 Exudative age-related macular degeneration, unspecified eye, stage unspecified: Secondary | ICD-10-CM | POA: Diagnosis not present

## 2014-01-16 ENCOUNTER — Ambulatory Visit (INDEPENDENT_AMBULATORY_CARE_PROVIDER_SITE_OTHER): Payer: Medicare Other | Admitting: Pharmacist

## 2014-01-16 DIAGNOSIS — Z7901 Long term (current) use of anticoagulants: Secondary | ICD-10-CM | POA: Diagnosis not present

## 2014-01-16 DIAGNOSIS — Z5181 Encounter for therapeutic drug level monitoring: Secondary | ICD-10-CM | POA: Diagnosis not present

## 2014-01-16 LAB — POCT INR: INR: 1.8

## 2014-01-22 ENCOUNTER — Telehealth: Payer: Self-pay | Admitting: Cardiovascular Disease

## 2014-01-22 ENCOUNTER — Other Ambulatory Visit: Payer: Self-pay | Admitting: Urology

## 2014-01-22 DIAGNOSIS — Q619 Cystic kidney disease, unspecified: Secondary | ICD-10-CM | POA: Diagnosis not present

## 2014-01-22 DIAGNOSIS — R310 Gross hematuria: Secondary | ICD-10-CM | POA: Diagnosis not present

## 2014-01-22 DIAGNOSIS — D494 Neoplasm of unspecified behavior of bladder: Secondary | ICD-10-CM | POA: Diagnosis not present

## 2014-01-22 DIAGNOSIS — Z8546 Personal history of malignant neoplasm of prostate: Secondary | ICD-10-CM | POA: Diagnosis not present

## 2014-01-22 NOTE — Telephone Encounter (Signed)
New message     Need clearance to remove bladder tumor--he is on coumadin.  Can they hold it 5 day prior to surgery?  Call and fax request to [phone]

## 2014-01-23 NOTE — Telephone Encounter (Signed)
This is fine 

## 2014-01-24 DIAGNOSIS — H2510 Age-related nuclear cataract, unspecified eye: Secondary | ICD-10-CM | POA: Diagnosis not present

## 2014-01-24 DIAGNOSIS — H353190 Nonexudative age-related macular degeneration, unspecified eye, stage unspecified: Secondary | ICD-10-CM | POA: Diagnosis not present

## 2014-01-25 ENCOUNTER — Other Ambulatory Visit: Payer: Self-pay | Admitting: Urology

## 2014-01-25 NOTE — Telephone Encounter (Signed)
I left message on Pam's voicemail that the pt is okay for procedure and to hold coumadin per Dr Burt Knack. I will fax this note as requested.

## 2014-01-30 ENCOUNTER — Ambulatory Visit (INDEPENDENT_AMBULATORY_CARE_PROVIDER_SITE_OTHER): Payer: Medicare Other | Admitting: *Deleted

## 2014-01-30 DIAGNOSIS — Z5181 Encounter for therapeutic drug level monitoring: Secondary | ICD-10-CM | POA: Diagnosis not present

## 2014-01-30 DIAGNOSIS — Z7901 Long term (current) use of anticoagulants: Secondary | ICD-10-CM

## 2014-01-30 LAB — POCT INR: INR: 2.6

## 2014-02-01 ENCOUNTER — Ambulatory Visit (INDEPENDENT_AMBULATORY_CARE_PROVIDER_SITE_OTHER): Payer: Medicare Other | Admitting: Cardiovascular Disease

## 2014-02-01 ENCOUNTER — Encounter: Payer: Self-pay | Admitting: Cardiovascular Disease

## 2014-02-01 VITALS — BP 134/66 | HR 61 | Ht 71.0 in | Wt 212.4 lb

## 2014-02-01 DIAGNOSIS — E785 Hyperlipidemia, unspecified: Secondary | ICD-10-CM

## 2014-02-01 DIAGNOSIS — I2510 Atherosclerotic heart disease of native coronary artery without angina pectoris: Secondary | ICD-10-CM | POA: Diagnosis not present

## 2014-02-01 DIAGNOSIS — I6529 Occlusion and stenosis of unspecified carotid artery: Secondary | ICD-10-CM | POA: Diagnosis not present

## 2014-02-01 DIAGNOSIS — I10 Essential (primary) hypertension: Secondary | ICD-10-CM | POA: Diagnosis not present

## 2014-02-01 NOTE — Patient Instructions (Signed)
Your physician has requested that you have a carotid duplex in NOVEMBER. This test is an ultrasound of the carotid arteries in your neck. It looks at blood flow through these arteries that supply the brain with blood. Allow one hour for this exam. There are no restrictions or special instructions.  Your physician wants you to follow-up in: 6 MONTHS with Dr Burt Knack. You will receive a reminder letter in the mail two months in advance. If you don't receive a letter, please call our office to schedule the follow-up appointment.  Your physician recommends that you continue on your current medications as directed. Please refer to the Current Medication list given to you today.

## 2014-02-01 NOTE — Progress Notes (Signed)
HPI:  79 year old gentleman presenting for followup evaluation. The patient has coronary artery disease status post CABG. He's also followed for atrial fibrillation, hypertension, carotid stenosis, and hyperlipidemia. Cardiac catheterization in 2013 demonstrated continued patency of the LIMA to LAD, free RIMA to acute marginal branch of the RCA, and saphenous vein graft to diagonal. The saphenous vein graft to distal RCA was occluded and there was moderately severe stenosis of the native left circumflex. Medical therapy was recommended. Lipids 07/11/2013 showed a cholesterol of 127, triglycerides 257, HDL 44, and LDL 59. Liver function tests were within normal limits.  The patient has been doing fine from a cardiac perspective. He denies chest pain, shortness of breath, palpitations, orthopnea, or PND. He continues to have multiple noncardiac medical issues. He had hematuria and will be undergoing cystoscopy for treatment of a bladder lesion in the near future. He has been having difficulty with vision loss and apparently has macular degeneration. He is scheduled for some laser surgeries in the near future as well. When he saw his cardiologist in [place] several months ago, his beta blocker was stopped because of bradycardia.   Outpatient Encounter Prescriptions as of 02/01/2014  Medication Sig  . aspirin 81 MG tablet Take 81 mg by mouth daily.    Marland Kitchen atorvastatin (LIPITOR) 10 MG tablet Take 1 tablet (10 mg total) by mouth daily.  . bromocriptine (PARLODEL) 2.5 MG tablet TAKE 1 TABLET DAILY  . clotrimazole (LOTRIMIN) 1 % cream Apply 1 application topically 3 (three) times daily. As needed for rash  . felodipine (PLENDIL) 5 MG 24 hr tablet TAKE 1 TABLET DAILY  . fluticasone (FLONASE) 50 MCG/ACT nasal spray USE 2 SPRAYS NASALLY DAILY  . glucose blood (ONE TOUCH ULTRA TEST) test strip 1 each by Other route daily. And lancets 1/day 250.00  . JANUVIA 100 MG tablet TAKE 1 TABLET DAILY  .  L-Methylfolate-Algae-B12-B6 (METANX) 3-90.314-2-35 MG CAPS TAKE 1 CAPSULE DAILY  . Lancets (ONETOUCH ULTRASOFT) lancets   . losartan (COZAAR) 50 MG tablet Take 1 tablet (50 mg total) by mouth 2 (two) times daily.  . montelukast (SINGULAIR) 10 MG tablet TAKE 1 TABLET AT BEDTIME  . pregabalin (LYRICA) 75 MG capsule Take 1 capsule (75 mg total) by mouth 2 (two) times daily.  . tamsulosin (FLOMAX) 0.4 MG CAPS capsule TAKE 1 CAPSULE TWICE A DAY  . warfarin (COUMADIN) 6 MG tablet TAKE AS DIRECTED BY COUMADIN CLINIC  . [DISCONTINUED] metoprolol succinate (TOPROL-XL) 25 MG 24 hr tablet TAKE ONE-HALF (1/2) TABLET (12.5 MG TOTAL) DAILY    Allergies  Allergen Reactions  . Amoxicillin Anaphylaxis  . Other Other (See Comments)    Muscle Relaxers; "I get very woozy"    Past Medical History  Diagnosis Date  . Hypertension   . Hyperlipidemia   . Coronary artery disease     MI '99  . Peripheral neuropathy   . Macular degeneration     has had intra-orbital injections. Has had laser treatment  . Prostate cancer     XRT 45 tx  . CLL (chronic lymphoblastic leukemia)   . Atrial fibrillation 2006    started 2006  . Type II diabetes mellitus   . Hepatitis C infection 1990's    ROS: Negative except as per HPI  BP 134/66  Pulse 61  Ht 5\' 11"  (1.803 m)  Wt 212 lb 6.4 oz (96.344 kg)  BMI 29.64 kg/m2  PHYSICAL EXAM: Pt is alert and oriented, NAD HEENT: normal Neck: JVP - normal,  carotids 2+= without bruits Lungs: CTA bilaterally CV: RRR without murmur or gallop Abd: soft, NT, Positive BS, no hepatomegaly Ext: 1+ edema right pretibial region, no edema on the left, distal pulses intact and equal Skin: warm/dry no rash  EKG:  Sinus rhythm 61 beats per minute, first degree AV block, minimal voltage criteria for LVH maybe normal variant, possible age-indeterminate anterior infarct with no significant change from previous tracing  ASSESSMENT AND PLAN: 1. Coronary artery disease, native vessel.  The patient is stable without symptoms of angina. Will continue his current medical program without changes.  2. Hypertension. Blood pressure is well controlled on losartan.  3. Paroxysmal atrial fibrillation. No symptoms of palpitations or dyspnea. He has in sinus rhythm. Tolerating anticoagulation with warfarin.  4. Carotid stenosis. Most recent duplex reviewed from October 2014 demonstrating 40-59% bilateral ICA stenosis. 1 year followup recommended. No neurologic symptoms. He is on appropriate medical therapy.  5. Hyperlipidemia. He continues on atorvastatin. Most recent lipid showed a cholesterol of 127, triglycerides 257, HDL 44, and LDL 59. Lifestyle modification was stressed with the patient today. He has abdominal obesity pattern and needs to work aggressively on diet and exercise with a goal for weight loss.  I will see him back in 6 months to  [name] 02/01/2014 10:39 AM

## 2014-02-04 ENCOUNTER — Encounter: Payer: Self-pay | Admitting: Podiatry

## 2014-02-04 ENCOUNTER — Ambulatory Visit (INDEPENDENT_AMBULATORY_CARE_PROVIDER_SITE_OTHER): Payer: Medicare Other | Admitting: Podiatry

## 2014-02-04 VITALS — BP 169/82 | HR 57 | Resp 16

## 2014-02-04 DIAGNOSIS — B351 Tinea unguium: Secondary | ICD-10-CM

## 2014-02-04 DIAGNOSIS — M79609 Pain in unspecified limb: Secondary | ICD-10-CM | POA: Diagnosis not present

## 2014-02-04 DIAGNOSIS — H2510 Age-related nuclear cataract, unspecified eye: Secondary | ICD-10-CM | POA: Diagnosis not present

## 2014-02-04 DIAGNOSIS — Q828 Other specified congenital malformations of skin: Secondary | ICD-10-CM

## 2014-02-04 DIAGNOSIS — E1159 Type 2 diabetes mellitus with other circulatory complications: Secondary | ICD-10-CM | POA: Diagnosis not present

## 2014-02-04 NOTE — Patient Instructions (Signed)
Diabetes and Foot Care Diabetes may cause you to have problems because of poor blood supply (circulation) to your feet and legs. This may cause the skin on your feet to become thinner, break easier, and heal more slowly. Your skin may become dry, and the skin may peel and crack. You may also have nerve damage in your legs and feet causing decreased feeling in them. You may not notice minor injuries to your feet that could lead to infections or more serious problems. Taking care of your feet is one of the most important things you can do for yourself.  HOME CARE INSTRUCTIONS  Wear shoes at all times, even in the house. Do not go barefoot. Bare feet are easily injured.  Check your feet daily for blisters, cuts, and redness. If you cannot see the bottom of your feet, use a mirror or ask someone for help.  Wash your feet with warm water (do not use hot water) and mild soap. Then pat your feet and the areas between your toes until they are completely dry. Do not soak your feet as this can dry your skin.  Apply a moisturizing lotion or petroleum jelly (that does not contain alcohol and is unscented) to the skin on your feet and to dry, brittle toenails. Do not apply lotion between your toes.  Trim your toenails straight across. Do not dig under them or around the cuticle. File the edges of your nails with an emery board or nail file.  Do not cut corns or calluses or try to remove them with medicine.  Wear clean socks or stockings every day. Make sure they are not too tight. Do not wear knee-high stockings since they may decrease blood flow to your legs.  Wear shoes that fit properly and have enough cushioning. To break in new shoes, wear them for just a few hours a day. This prevents you from injuring your feet. Always look in your shoes before you put them on to be sure there are no objects inside.  Do not cross your legs. This may decrease the blood flow to your feet.  If you find a minor scrape,  cut, or break in the skin on your feet, keep it and the skin around it clean and dry. These areas may be cleansed with mild soap and water. Do not cleanse the area with peroxide, alcohol, or iodine.  When you remove an adhesive bandage, be sure not to damage the skin around it.  If you have a wound, look at it several times a day to make sure it is healing.  Do not use heating pads or hot water bottles. They may burn your skin. If you have lost feeling in your feet or legs, you may not know it is happening until it is too late.  Make sure your health care provider performs a complete foot exam at least annually or more often if you have foot problems. Report any cuts, sores, or bruises to your health care provider immediately. SEEK MEDICAL CARE IF:   You have an injury that is not healing.  You have cuts or breaks in the skin.  You have an ingrown nail.  You notice redness on your legs or feet.  You feel burning or tingling in your legs or feet.  You have pain or cramps in your legs and feet.  Your legs or feet are numb.  Your feet always feel cold. SEEK IMMEDIATE MEDICAL CARE IF:   There is increasing redness,   swelling, or pain in or around a wound.  There is a red line that goes up your leg.  Pus is coming from a wound.  You develop a fever or as directed by your health care provider.  You notice a bad smell coming from an ulcer or wound. Document Released: 09/24/2000 Document Revised: 05/30/2013 Document Reviewed: 03/06/2013 [employer] Patient Information 2014 [employer].  

## 2014-02-05 DIAGNOSIS — Z9889 Other specified postprocedural states: Secondary | ICD-10-CM | POA: Diagnosis not present

## 2014-02-05 DIAGNOSIS — H9210 Otorrhea, unspecified ear: Secondary | ICD-10-CM | POA: Diagnosis not present

## 2014-02-05 DIAGNOSIS — H6120 Impacted cerumen, unspecified ear: Secondary | ICD-10-CM | POA: Diagnosis not present

## 2014-02-05 DIAGNOSIS — H6980 Other specified disorders of Eustachian tube, unspecified ear: Secondary | ICD-10-CM | POA: Diagnosis not present

## 2014-02-06 ENCOUNTER — Encounter (HOSPITAL_BASED_OUTPATIENT_CLINIC_OR_DEPARTMENT_OTHER): Payer: Self-pay | Admitting: *Deleted

## 2014-02-06 ENCOUNTER — Other Ambulatory Visit: Payer: Self-pay

## 2014-02-06 MED ORDER — METANX 3-90.314-2-35 MG PO CAPS: ORAL_CAPSULE | ORAL | Status: DC

## 2014-02-06 NOTE — Progress Notes (Signed)
NPO AFTER MN. ARRIVE AT 0915.  NEEDS CBC, BMET, PT/INR .  CURRENT EKG IN EPIC AND CHART. CLEARANCE TO STOP COUMADIN WITH CHART. WILL TAKE FELODIPINE, COZAAR, AND LYRICA AM DOS W/ SIPS OF WATER.

## 2014-02-06 NOTE — Progress Notes (Signed)
02/06/14 1521  OBSTRUCTIVE SLEEP APNEA  Have you ever been diagnosed with sleep apnea through a sleep study? No  Do you snore loudly (loud enough to be heard through closed doors)?  1  Do you often feel tired, fatigued, or sleepy during the daytime? 0  Has anyone observed you stop breathing during your sleep? 1  Do you have, or are you being treated for high blood pressure? 1  BMI more than 35 kg/m2? 0  Age over 77 years old? 1  Neck circumference greater than 40 cm/16 inches? 0  Gender: 1  Obstructive Sleep Apnea Score 5  Score 4 or greater  Results sent to PCP

## 2014-02-06 NOTE — Progress Notes (Signed)
Subjective:     Patient ID: Ralph Gray, male   DOB: [birth year], 79 y.o.   MRN: [id number]  HPI patient has history of ulceration left fifth metatarsal with keratotic lesion formation and nail disease 1-5 both feet with thickness and discomfort that he cannot take care of. Patient is a diabetic   Review of Systems     Objective:   Physical Exam Neurovascular status unchanged from previous visit well oriented x3. Keratotic lesion sub-fifth metatarsal left it's painful when pressed nail disease with thickness 1-5 both feet that he cannot take care of with brittle like appeared    Assessment:     Mycotic nail infection 1-5 both feet with pain and lesion sub-fifth metatarsal left that is painful when pressed with at risk diabetic condition    Plan:     Debridement of painful nailbeds 1-5 both feet with no iatrogenic bleeding noted and debridement of lesion sub-fifth metatarsal left with no bleeding noted

## 2014-02-08 ENCOUNTER — Encounter (HOSPITAL_BASED_OUTPATIENT_CLINIC_OR_DEPARTMENT_OTHER): Payer: Medicare Other | Admitting: Anesthesiology

## 2014-02-08 ENCOUNTER — Ambulatory Visit (HOSPITAL_BASED_OUTPATIENT_CLINIC_OR_DEPARTMENT_OTHER)
Admission: RE | Admit: 2014-02-08 | Discharge: 2014-02-08 | Disposition: A | Discharge status: Home / Self Care | Payer: Medicare Other | Source: Ambulatory Visit | Attending: Urology | Admitting: Urology

## 2014-02-08 ENCOUNTER — Ambulatory Visit (HOSPITAL_BASED_OUTPATIENT_CLINIC_OR_DEPARTMENT_OTHER): Payer: Medicare Other | Admitting: Anesthesiology

## 2014-02-08 ENCOUNTER — Encounter (HOSPITAL_BASED_OUTPATIENT_CLINIC_OR_DEPARTMENT_OTHER): Payer: Self-pay

## 2014-02-08 ENCOUNTER — Encounter (HOSPITAL_BASED_OUTPATIENT_CLINIC_OR_DEPARTMENT_OTHER)
Admission: RE | Disposition: A | Discharge status: Home / Self Care | Payer: Self-pay | Source: Ambulatory Visit | Attending: Urology

## 2014-02-08 DIAGNOSIS — Z87891 Personal history of nicotine dependence: Secondary | ICD-10-CM | POA: Insufficient documentation

## 2014-02-08 DIAGNOSIS — I2510 Atherosclerotic heart disease of native coronary artery without angina pectoris: Secondary | ICD-10-CM | POA: Diagnosis not present

## 2014-02-08 DIAGNOSIS — D494 Neoplasm of unspecified behavior of bladder: Secondary | ICD-10-CM

## 2014-02-08 DIAGNOSIS — E119 Type 2 diabetes mellitus without complications: Secondary | ICD-10-CM | POA: Insufficient documentation

## 2014-02-08 DIAGNOSIS — I252 Old myocardial infarction: Secondary | ICD-10-CM | POA: Diagnosis not present

## 2014-02-08 DIAGNOSIS — Z79899 Other long term (current) drug therapy: Secondary | ICD-10-CM | POA: Diagnosis not present

## 2014-02-08 DIAGNOSIS — I10 Essential (primary) hypertension: Secondary | ICD-10-CM | POA: Diagnosis not present

## 2014-02-08 DIAGNOSIS — Z923 Personal history of irradiation: Secondary | ICD-10-CM | POA: Insufficient documentation

## 2014-02-08 DIAGNOSIS — C679 Malignant neoplasm of bladder, unspecified: Secondary | ICD-10-CM | POA: Diagnosis not present

## 2014-02-08 DIAGNOSIS — Z7901 Long term (current) use of anticoagulants: Secondary | ICD-10-CM | POA: Diagnosis not present

## 2014-02-08 DIAGNOSIS — D414 Neoplasm of uncertain behavior of bladder: Secondary | ICD-10-CM | POA: Diagnosis not present

## 2014-02-08 DIAGNOSIS — R310 Gross hematuria: Secondary | ICD-10-CM | POA: Diagnosis not present

## 2014-02-08 DIAGNOSIS — I4891 Unspecified atrial fibrillation: Secondary | ICD-10-CM | POA: Diagnosis not present

## 2014-02-08 DIAGNOSIS — Z7982 Long term (current) use of aspirin: Secondary | ICD-10-CM | POA: Insufficient documentation

## 2014-02-08 DIAGNOSIS — Z8546 Personal history of malignant neoplasm of prostate: Secondary | ICD-10-CM | POA: Diagnosis not present

## 2014-02-08 HISTORY — DX: Nocturia: R35.1

## 2014-02-08 HISTORY — PX: TRANSURETHRAL RESECTION OF BLADDER TUMOR: SHX2575

## 2014-02-08 HISTORY — DX: Atrioventricular block, first degree: I44.0

## 2014-02-08 HISTORY — DX: Neoplasm of unspecified behavior of bladder: D49.4

## 2014-02-08 HISTORY — DX: Presence of spectacles and contact lenses: Z97.3

## 2014-02-08 HISTORY — DX: Unspecified cataract: H26.9

## 2014-02-08 HISTORY — DX: Other specified personal risk factors, not elsewhere classified: Z91.89

## 2014-02-08 HISTORY — DX: Presence of aortocoronary bypass graft: Z95.1

## 2014-02-08 HISTORY — DX: Occlusion and stenosis of bilateral carotid arteries: I65.23

## 2014-02-08 HISTORY — DX: Disorder of kidney and ureter, unspecified: N28.9

## 2014-02-08 HISTORY — DX: Personal history of other infectious and parasitic diseases: Z86.19

## 2014-02-08 HISTORY — DX: Personal history of leukemia: Z85.6

## 2014-02-08 HISTORY — DX: Paroxysmal atrial fibrillation: I48.0

## 2014-02-08 HISTORY — DX: Long term (current) use of anticoagulants: Z79.01

## 2014-02-08 HISTORY — DX: Old myocardial infarction: I25.2

## 2014-02-08 HISTORY — DX: Personal history of malignant neoplasm of prostate: Z85.46

## 2014-02-08 LAB — CBC
HCT: 40.1 % (ref 39.0–52.0)
Hemoglobin: 13.7 g/dL (ref 13.0–17.0)
MCH: 30.8 pg (ref 26.0–34.0)
MCHC: 34.2 g/dL (ref 30.0–36.0)
MCV: 90.1 fL (ref 78.0–100.0)
PLATELETS: 125 10*3/uL — AB (ref 150–400)
RBC: 4.45 MIL/uL (ref 4.22–5.81)
RDW: 13.8 % (ref 11.5–15.5)
WBC: 10.9 10*3/uL — ABNORMAL HIGH (ref 4.0–10.5)

## 2014-02-08 LAB — PROTIME-INR
INR: 1.14 (ref 0.00–1.49)
PROTHROMBIN TIME: 14.4 s (ref 11.6–15.2)

## 2014-02-08 LAB — BASIC METABOLIC PANEL
BUN: 21 mg/dL (ref 6–23)
CALCIUM: 9.3 mg/dL (ref 8.4–10.5)
CO2: 23 mEq/L (ref 19–32)
Chloride: 103 mEq/L (ref 96–112)
Creatinine, Ser: 1.16 mg/dL (ref 0.50–1.35)
GFR calc Af Amer: 69 mL/min — ABNORMAL LOW (ref >90)
GFR, EST NON AFRICAN AMERICAN: 59 mL/min — AB (ref >90)
GLUCOSE: 182 mg/dL — AB (ref 70–99)
Potassium: 4.6 mEq/L (ref 3.7–5.3)
SODIUM: 140 meq/L (ref 137–147)

## 2014-02-08 LAB — GLUCOSE, CAPILLARY: Glucose-Capillary: 156 mg/dL — ABNORMAL HIGH (ref 70–99)

## 2014-02-08 SURGERY — TURBT (TRANSURETHRAL RESECTION OF BLADDER TUMOR)
Anesthesia: General | Site: Bladder

## 2014-02-08 MED ORDER — FENTANYL CITRATE 0.05 MG/ML IJ SOLN
INTRAMUSCULAR | Status: DC | PRN
  Administered 2014-02-08 (×2): 50 ug via INTRAVENOUS

## 2014-02-08 MED ORDER — ONDANSETRON HCL 4 MG/2ML IJ SOLN
INTRAMUSCULAR | Status: DC | PRN
  Administered 2014-02-08: 4 mg via INTRAVENOUS

## 2014-02-08 MED ORDER — PHENAZOPYRIDINE HCL 200 MG PO TABS
200.0000 mg | ORAL_TABLET | Freq: Once | ORAL | Status: AC
  Administered 2014-02-08: 200 mg via ORAL
  Filled 2014-02-08: qty 1

## 2014-02-08 MED ORDER — PROPOFOL 10 MG/ML IV BOLUS
INTRAVENOUS | Status: DC | PRN
  Administered 2014-02-08: 150 mg via INTRAVENOUS

## 2014-02-08 MED ORDER — FENTANYL CITRATE 0.05 MG/ML IJ SOLN
INTRAMUSCULAR | Status: AC
  Filled 2014-02-08: qty 4

## 2014-02-08 MED ORDER — MIDAZOLAM HCL 5 MG/5ML IJ SOLN
INTRAMUSCULAR | Status: DC | PRN
  Administered 2014-02-08: 2 mg via INTRAVENOUS

## 2014-02-08 MED ORDER — SODIUM CHLORIDE 0.9 % IR SOLN
Status: DC | PRN
  Administered 2014-02-08: 6000 mL

## 2014-02-08 MED ORDER — LACTATED RINGERS IV SOLN
INTRAVENOUS | Status: DC
  Administered 2014-02-08: 10:00:00 via INTRAVENOUS
  Filled 2014-02-08: qty 1000

## 2014-02-08 MED ORDER — PHENAZOPYRIDINE HCL 200 MG PO TABS: 200.0000 mg | ORAL_TABLET | Freq: Three times a day (TID) | ORAL | Status: DC | PRN

## 2014-02-08 MED ORDER — FENTANYL CITRATE 0.05 MG/ML IJ SOLN
25.0000 ug | INTRAMUSCULAR | Status: DC | PRN
  Filled 2014-02-08: qty 1

## 2014-02-08 MED ORDER — PHENAZOPYRIDINE HCL 200 MG PO TABS
200.0000 mg | ORAL_TABLET | Freq: Once | ORAL | Status: DC
Start: 2014-02-08 — End: 2014-02-08
  Filled 2014-02-08: qty 1

## 2014-02-08 MED ORDER — LIDOCAINE HCL (CARDIAC) 20 MG/ML IV SOLN
INTRAVENOUS | Status: DC | PRN
  Administered 2014-02-08: 50 mg via INTRAVENOUS

## 2014-02-08 MED ORDER — PHENAZOPYRIDINE HCL 100 MG PO TABS
ORAL_TABLET | ORAL | Status: AC
  Filled 2014-02-08: qty 2

## 2014-02-08 MED ORDER — KETOROLAC TROMETHAMINE 30 MG/ML IJ SOLN
15.0000 mg | Freq: Once | INTRAMUSCULAR | Status: DC | PRN
  Filled 2014-02-08: qty 1

## 2014-02-08 MED ORDER — MIDAZOLAM HCL 2 MG/2ML IJ SOLN
INTRAMUSCULAR | Status: AC
  Filled 2014-02-08: qty 2

## 2014-02-08 MED ORDER — PROMETHAZINE HCL 25 MG/ML IJ SOLN
6.2500 mg | INTRAMUSCULAR | Status: DC | PRN
  Filled 2014-02-08: qty 1

## 2014-02-08 MED ORDER — CIPROFLOXACIN IN D5W 200 MG/100ML IV SOLN
200.0000 mg | INTRAVENOUS | Status: AC
  Administered 2014-02-08: 200 mg via INTRAVENOUS
  Filled 2014-02-08: qty 100

## 2014-02-08 MED ORDER — HYDROCODONE-ACETAMINOPHEN 10-325 MG PO TABS: 1.0000 | ORAL_TABLET | ORAL | Status: DC | PRN

## 2014-02-08 SURGICAL SUPPLY — 30 items
BAG DRAIN URO-CYSTO SKYTR STRL (DRAIN) ×2 IMPLANT
BAG URINE DRAINAGE (UROLOGICAL SUPPLIES) IMPLANT
BAG URINE LEG 19OZ MD ST LTX (BAG) IMPLANT
CANISTER SUCT LVC 12 LTR MEDI- (MISCELLANEOUS) ×4 IMPLANT
CATH FOLEY 2WAY SLVR  5CC 20FR (CATHETERS)
CATH FOLEY 2WAY SLVR  5CC 22FR (CATHETERS)
CATH FOLEY 2WAY SLVR  5CC 24FR (CATHETERS)
CATH FOLEY 2WAY SLVR 5CC 20FR (CATHETERS) IMPLANT
CATH FOLEY 2WAY SLVR 5CC 22FR (CATHETERS) IMPLANT
CATH FOLEY 2WAY SLVR 5CC 24FR (CATHETERS) IMPLANT
CATH FOLEY 3WAY 20FR (CATHETERS) IMPLANT
CLOTH BEACON ORANGE TIMEOUT ST (SAFETY) ×2 IMPLANT
DRAPE CAMERA CLOSED 9X96 (DRAPES) ×2 IMPLANT
ELECT BUTTON HF 24-28F 2 30DE (ELECTRODE) IMPLANT
ELECT LOOP MED HF 24F 12D (CUTTING LOOP) ×2 IMPLANT
ELECT REM PT RETURN 9FT ADLT (ELECTROSURGICAL)
ELECT RESECT VAPORIZE 12D CBL (ELECTRODE) IMPLANT
ELECTRODE REM PT RTRN 9FT ADLT (ELECTROSURGICAL) IMPLANT
EVACUATOR MICROVAS BLADDER (UROLOGICAL SUPPLIES) ×2 IMPLANT
GLOVE BIO SURGEON STRL SZ8 (GLOVE) ×2 IMPLANT
GLOVE INDICATOR 8.0 STRL GRN (GLOVE) ×2 IMPLANT
GOWN PREVENTION PLUS LG XLONG (DISPOSABLE) IMPLANT
GOWN STRL REIN XL XLG (GOWN DISPOSABLE) IMPLANT
GOWN STRL REUS W/TWL XL LVL3 (GOWN DISPOSABLE) ×2 IMPLANT
HOLDER FOLEY CATH W/STRAP (MISCELLANEOUS) IMPLANT
IV NS IRRIG 3000ML ARTHROMATIC (IV SOLUTION) IMPLANT
PACK CYSTOSCOPY (CUSTOM PROCEDURE TRAY) ×2 IMPLANT
PLUG CATH AND CAP STER (CATHETERS) IMPLANT
SET ASPIRATION TUBING (TUBING) ×2 IMPLANT
WATER STERILE IRR 3000ML UROMA (IV SOLUTION) ×4 IMPLANT

## 2014-02-08 NOTE — Anesthesia Preprocedure Evaluation (Signed)
Anesthesia Evaluation  Patient identified by MRN, date of birth, ID band Patient awake    Reviewed: Allergy & Precautions, H&P , NPO status , Patient's Chart, lab work & pertinent test results  Airway Mallampati: II TM Distance: >3 FB Neck ROM: Full    Dental no notable dental hx.    Pulmonary neg pulmonary ROS, former smoker,  breath sounds clear to auscultation  Pulmonary exam normal       Cardiovascular hypertension, Pt. on medications + CAD, + CABG and + Peripheral Vascular Disease + dysrhythmias Atrial Fibrillation Rhythm:Regular Rate:Normal     Neuro/Psych negative neurological ROS  negative psych ROS   GI/Hepatic negative GI ROS, (+) Hepatitis -, C  Endo/Other  diabetes, Type 2  Renal/GU negative Renal ROS  negative genitourinary   Musculoskeletal negative musculoskeletal ROS (+)   Abdominal   Peds negative pediatric ROS (+)  Hematology negative hematology ROS (+)   Anesthesia Other Findings   Reproductive/Obstetrics negative OB ROS                           Anesthesia Physical Anesthesia Plan  ASA: III  Anesthesia Plan: General   Post-op Pain Management:    Induction: Intravenous  Airway Management Planned: LMA  Additional Equipment:   Intra-op Plan:   Post-operative Plan: Extubation in OR  Informed Consent: I have reviewed the patients History and Physical, chart, labs and discussed the procedure including the risks, benefits and alternatives for the proposed anesthesia with the patient or authorized representative who has indicated his/her understanding and acceptance.   Dental advisory given  Plan Discussed with: CRNA and Surgeon  Anesthesia Plan Comments:         Anesthesia Quick Evaluation

## 2014-02-08 NOTE — Anesthesia Postprocedure Evaluation (Signed)
  Anesthesia Post-op Note  Patient: Ralph Gray  Procedure(s) Performed: Procedure(s) (LRB): TRANSURETHRAL RESECTION OF BLADDER TUMOR (TURBT) WITH GYRUS  (N/A)  Patient Location: PACU  Anesthesia Type: General  Level of Consciousness: awake and alert   Airway and Oxygen Therapy: Patient Spontanous Breathing  Post-op Pain: mild  Post-op Assessment: Post-op Vital signs reviewed, Patient's Cardiovascular Status Stable, Respiratory Function Stable, Patent Airway and No signs of Nausea or vomiting  Last Vitals:  Filed Vitals:   02/08/14 1200  BP: 125/57  Pulse: 63  Temp:   Resp: 15    Post-op Vital Signs: stable   Complications: No apparent anesthesia complications

## 2014-02-08 NOTE — Transfer of Care (Signed)
Immediate Anesthesia Transfer of Care Note  Patient: Ralph Gray  Procedure(s) Performed: Procedure(s): TRANSURETHRAL RESECTION OF BLADDER TUMOR (TURBT) WITH GYRUS  (N/A)  Patient Location: PACU  Anesthesia Type:General  Level of Consciousness: sedated  Airway & Oxygen Therapy: Patient Spontanous Breathing and Patient connected to nasal cannula oxygen  Post-op Assessment: Report given to PACU RN and Post -op Vital signs reviewed and stable  Post vital signs: stable  Complications: No apparent anesthesia complications

## 2014-02-08 NOTE — Op Note (Signed)
PATIENT:  Ralph Gray  PRE-OPERATIVE DIAGNOSIS: Bladder tumor  POST-OPERATIVE DIAGNOSIS: Same  PROCEDURE:  Procedure(s): TRANSURETHRAL RESECTION OF BLADDER TUMOR (TURBT) (0.5 cm.)  SURGEON:  Surgeon(s): [name]  ANESTHESIA:   General  EBL:  Minimal  SPECIMEN:  Source of Specimen:  Bladder tumor  DISPOSITION OF SPECIMEN:  PATHOLOGY  Indication: [name] is a 79 year old male who has undergone previous radiation and developed gross hematuria. He was on Coumadin. Upper tract evaluation revealed no abnormality of the kidneys or ureters. Office cystoscopy revealed a lesion in the bladder on the right floor that appeared to be the obvious source of bleeding. No other lesions were noted. He is brought to the operating room for resection of the lesion.  Description of operation: The patient was taken to the operating room and administered general anesthesia. He was then placed on the table and moved to the dorsal lithotomy position after which his genitalia was sterilely prepped and draped. An official timeout was then performed.  The 43 French resectoscope with visual obturator was then introduced into the bladder and the obturator was removed. The resectoscope element with 12 lens was then inserted and the bladder was fully and systematically inspected. Ureteral orifices were noted to be in the normal anatomic positions. There were radiation changes seen involving the floor of the bladder and the bladder neck region with a large superficial vessels. The lesion was again identified on the floor of the bladder on the right-hand side posterior and lateral to the right ureteral orifice. It appeared to be more of a tangle of blood vessels then an actual papillary lesion.  I first began by resecting the lesion and fulgurated the base.. Reinspection of the bladder revealed all obvious tumor had been fully resected and there was no evidence of perforation. The Microvasive evacuator was  then used to irrigate the bladder and remove the bladder tumor which was sent to pathology. I then removed the resectoscope.  PLAN OF CARE: Discharge to home after PACU  PATIENT DISPOSITION:  PACU - hemodynamically stable.

## 2014-02-08 NOTE — H&P (Signed)
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 He reports his PSA fell and has remained low.    Gross hematuria: He experienced gross, painless hematuria on Coumadin while vacationing in [place]. A renal ultrasound was found to be negative.    Renal insufficiency: On 01/03/14 it was found to have a creatinine of 2.3. Several medications that could potentially have contributed to this were stopped and a follow-up creatinine on 01/07/14 was normal at 1.05.    Interval history: He has seen no further gross hematuria.    Past Medical History Problems  1. History of acute myocardial infarction (412) 2. History of atrial fibrillation (V12.59) 3. History of diabetes mellitus (V12.29) 4. History of hypertension (V12.59) 5. History of prostate cancer (V10.46) 6. History of Radiation Therapy (V15.3)  Surgical History Problems  1. History of CABG (CABG)  Current Meds 1. Aspirin 81 MG Oral Tablet;  Therapy: (Recorded:30Mar2015) to Recorded 2. Atorvastatin Calcium 10 MG Oral Tablet;  Therapy: 01Oct2014 to Recorded 3. Bromocriptine Mesylate 2.5 MG Oral Tablet;  Therapy: [id number] to Recorded 4. Felodipine ER 5 MG Oral Tablet Extended Release 24 Hour;  Therapy: [id number] to Recorded 5. Flonase 50 MCG/ACT Nasal Suspension;  Therapy: (Recorded:30Mar2015) to Recorded 6. Invokana 100 MG Oral Tablet;  Therapy: 22Apr2014 to Recorded 7. Januvia 100 MG Oral Tablet;  Therapy: [id number] to Recorded 8. Losartan Potassium 50 MG Oral Tablet;  Therapy: [id number] to Recorded 9. Lyrica 75 MG Oral Capsule;  Therapy: 30Oct2014 to Recorded 10. Metanx 3-90.314-2-35 MG Oral Capsule;   Therapy: 25Aug2014 to Recorded 11. MetFORMIN HCl ER (OSM) 1000 MG Oral Tablet Extended Release 24 Hour;   Therapy: 02Oct2014 to Recorded 12. Montelukast Sodium 10 MG Oral Tablet;   Therapy: [id number] to Recorded 13. Tamsulosin HCl - 0.4 MG Oral Capsule;   Therapy:  29Sep2014 to Recorded 14. Warfarin Sodium 6 MG Oral Tablet;   Therapy: [id number] to Recorded  Allergies Medication  1. Amoxicillin TABS  Family History Problems  1. Family history of acute myocardial infarction (V17.3) : Father 2. Family history of malignant neoplasm (V16.9) : Mother  Social History Problems  1. Denied: History of Alcohol use 2. Caffeine use (V49.89) 3. Death in the family, father   age 25 4. Death in the family, mother   age 80 5. Former smoker (V15.82)   quit 45 years ago 50. Married 7. Retired 109. Three children  Review of Systems Genitourinary, constitutional, skin, eye, otolaryngeal, hematologic/lymphatic, cardiovascular, pulmonary, endocrine, musculoskeletal, gastrointestinal, neurological and psychiatric system(s) were reviewed and pertinent findings if present are noted.  Genitourinary: nocturia and hematuria.    Vitals Vital Signs  Blood Pressure: 157 / 78 Heart Rate: 60 Recorded: [id number] 02:24PM  Height: 5 ft 11 in Weight: 210 lb  BMI Calculated: 29.29 BSA Calculated: 2.15  Physical Exam Constitutional: Well nourished and well developed . No acute distress.  ENT:. The ears and nose are normal in appearance.  Neck: The appearance of the neck is normal and no neck mass is present.  Pulmonary: No respiratory distress and normal respiratory rhythm and effort.  Cardiovascular: Heart rate and rhythm are normal . No peripheral edema.  Abdomen: The abdomen is soft and nontender. No masses are palpated. No CVA tenderness. No hernias are palpable. No hepatosplenomegaly noted.  Rectal: Rectal exam demonstrates normal sphincter tone, no tenderness and no masses. The prostate is smooth and flat. The prostate has no nodularity and is not tender. The left seminal vesicle is nonpalpable.  The right seminal vesicle is nonpalpable. The perineum is normal on inspection.  Genitourinary: Examination of the penis demonstrates no discharge, no masses, no lesions  and a normal meatus. The scrotum is without lesions. The right epididymis is palpably normal and non-tender. The left epididymis is palpably normal and non-tender. The right testis is non-tender and without masses. The left testis is non-tender and without masses.  Lymphatics: The femoral and inguinal nodes are not enlarged or tender.  Skin: Normal skin turgor, no visible rash and no visible skin lesions.  Neuro/Psych:. Mood and affect are appropriate.   AU CT-HEMATURIA PROTOCOL 06Apr2015 12:00AM Ralph Gray  Test Name Result Flag Reference AU CT-HEMATURIA PROTOCOL (Report)   ** RADIOLOGY REPORT BY [facility] **   CLINICAL DATA: Gross hematuria recently for 2 days. History of prostate cancer with radiation therapy in 2010.  EXAM: CT ABDOMEN AND PELVIS WITHOUT AND WITH CONTRAST  TECHNIQUE: Multidetector CT imaging of the abdomen and pelvis was performed without contrast material in one or both body regions, followed by contrast material(s) and further sections in one or both body regions.  CONTRAST: 125 ml Isovue-300.  COMPARISON: None.  FINDINGS: Lung bases: There is mild dependent atelectasis at both lung bases. In the left lower lobe, there is a 5 mm subpleural nodule on image 3. The heart is mildly enlarged status post median sternotomy.  Kidneys / Ureters / Bladder: Pre-contrast images demonstrate no renal, ureteral or bladder calculi. Post-contrast, both kidneys enhance normally. There is no evidence of renal mass. There is right renal cortical thinning. There are 2 adjacent cysts in the upper pole of the right kidney. There is a tiny low-density lesion posteriorly in the mid left kidney. Delayed images result in segmental visualization of the ureters. 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 No urothelial abnormalities are identified. The bladder appears unremarkable.  Other: Multiple hepatic cysts are demonstrated, measuring up to 1.6 cm in diameter.  No enhancing liver lesions are seen. The spleen and adrenal glands appear normal. The gallbladder is contracted. There is no biliary dilatation. Within the pancreatic head is a 1 cm low-density lesion, best seen on image 32 of series 3. The pancreas otherwise appears normal and demonstrates no ductal dilatation.  There is mild aortoiliac atherosclerosis. No enlarged abdominal pelvic lymph nodes are seen. The stomach, small bowel, appendix and colon demonstrate no acute findings. There are scattered diverticular changes of the sigmoid colon.  The prostate gland and seminal vesicles appear normal. There are several pelvic phleboliths bilaterally. There is a small umbilical hernia containing only fat.  Bones / Musculoskeletal: Lower lumbar spine degenerative changes are most advanced at L5-S1. There are no worrisome osseous findings.  IMPRESSION: 1. No specific explanation for hematuria identified. There is no evidence of renal or urothelial mass. 2. The right kidney demonstrates cortical thinning, small cysts and duplication of its collecting system. 3. 1 cm low-density pancreatic lesion demonstrates no aggressive characteristics by CT. This is likely a postinflammatory pseudocyst or small indolent cystic neoplasm. Given the small size of this lesion, MR follow-up (without and with contrast) in 1 year suggested to further characterize and assess stability. 4. Hepatic cysts, atherosclerosis and probably benign small subpleural left lower lobe pulmonary nodule noted.   Electronically Signed  By: Camie Patience M.D.  On: 01/14/2014 16:47   SPECIMEN TYPE: BLOOD  Test Name Result Flag Reference PSA 0.06 ng/mL  <=4.00 TEST METHODOLOGY: ECLIA PSA (ELECTROCHEMILUMINESCENCE IMMUNOASSAY)  BUN & CREATININE [id number] 03:17PM Ralph Gray SPECIMEN TYPE:  BLOOD  Test Name Result Flag Reference CREATININE 1.05 mg/dL  0.50-1.50  Procedure: Cystoscopy done on 01/22/14   Indication:  Hematuria.  Informed Consent: Risks, benefits, and potential adverse events were discussed and informed consent was obtained from the patient.  Prep: The patient was prepped with betadine.  Procedure Note:  Urethral meatus:. No abnormalities.  Anterior urethra: No abnormalities.  Prostatic urethra: No abnormalities.  Bladder: Visulization was clear. The ureteral orifices were in the normal anatomic position bilaterally and had clear efflux of urine. A systematic survey of the bladder demonstrated no bladder tumors or stones. A solitary tumor was visualized in the bladder. A nodular tumor was seen in the bladder. This tumor was located at the base of the bladder. The patient tolerated the procedure well.  Complications: None.    Assessment We discussed his blood work results which revealed normalization of his creatinine down to 1.05 and an excellent PSA of 0.06.  His CT scan showed no evidence of abnormality of the upper tract to account for his gross hematuria. We discussed the benign finding of right renal cyst. I also told him about the pancreatic findings and the need to have his primary care physician follow this up.  Cystoscopy today revealed telangiectic vessels consistent with the effects of radiation involving the bladder neck region. There was a nodular/papillary lesion located on the floor of the bladder on the right hand side. I suspect, based on its appearance, that this is where his gross hematuria has come from.  We therefore discussed proceeding with transurethral resection of the lesion. I went over the procedure with him in detail including its risks and complications, the outpatient nature of the procedure, the probability of success as well as the anticipated postoperative course. He understands and is likely proceed.   Plan  1. He will stop his Coumadin 5 days prior to his planned procedure.  2. He'll be scheduled for cystoscopy and transurethral resection of the bladder  lesion.

## 2014-02-08 NOTE — Discharge Instructions (Signed)
Post Bladder Surgery Instructions   General instructions:     Your recent bladder surgery requires very little post hospital care but some definite precautions.  Despite the fact that no skin incisions were used, the area around the bladder incisions are raw and covered with scabs to promote healing and prevent bleeding. Certain precautions are needed to insure that the scabs are not disturbed over the next 2-4 weeks while the healing proceeds.  Because the raw surface inside your bladder and the irritating effects of urine you may expect frequency of urination and/or urgency (a stronger desire to urinate) and perhaps even getting up at night more often. This will usually resolve or improve slowly over the healing period. You may see some blood in your urine over the first 6 weeks. Do not be alarmed, even if the urine was clear for a while. Get off your feet and drink lots of fluids until clearing occurs. If you start to pass clots or don't improve call us.  Catheter: (If you are discharged with a catheter.)  1. Keep your catheter secured to your leg at all times with tape or the supplied strap. 2. You may experience leakage of urine around your catheter- as long as the  catheter continues to drain, this is normal.  If your catheter stops draining  go to the ER. 3. You may also have blood in your urine, even after it has been clear for  several days; you may even pass some small blood clots or other material.  This  is normal as well.  If this happens, sit down and drink plenty of water to help  make urine to flush out your bladder.  If the blood in your urine becomes worse  after doing this, contact our office or return to the ER. 4. You may use the leg bag (small bag) during the day, but use the large bag at  night.  Diet:  You may return to your normal diet immediately. Because of the raw surface of your bladder, alcohol, spicy foods, foods high in acid and drinks with caffeine may  cause irritation or frequency and should be used in moderation. To keep your urine flowing freely and avoid constipation, drink plenty of fluids during the day (8-10 glasses). Tip: Avoid cranberry juice because it is very acidic.  Activity:  Your physical activity doesn't need to be restricted. However, if you are very active, you may see some blood in the urine. We suggest that you reduce your activity under the circumstances until the bleeding has stopped.  Bowels:  It is important to keep your bowels regular during the postoperative period. Straining with bowel movements can cause bleeding. A bowel movement every other day is reasonable. Use a mild laxative if needed, such as milk of magnesia 2-3 tablespoons, or 2 Dulcolax tablets. Call if you continue to have problems. If you had been taking narcotics for pain, before, during or after your surgery, you may be constipated. Take a laxative if necessary.    Medication:  You should resume your pre-surgery medications unless told not to. In addition you may be given an antibiotic to prevent or treat infection. Antibiotics are not always necessary. All medication should be taken as prescribed until the bottles are finished unless you are having an unusual reaction to one of the drugs.   Transurethral Resection, Bladder Tumor A cancerous growth (tumor) can develop on the inside wall of the bladder. The bladder is the organ that holds urine.  One way to remove the tumor is a procedure called a transurethral resection. The tumor is removed (resected) through the tube that carries urine from the bladder out of the body (urethra). No cuts (incisions) are made in the skin. Instead, the procedure is done through a thin telescope, called a resectoscope. Attached to it is a light and usually a tiny camera. The resectoscope is put into the urethra. In men, the urethra opens at the end of the penis. In women, it opens just above the vagina.  A transurethral  resection is usually used to remove tumors that have not gotten too big or too deep. These are called Stage 0, Stage 1 or Stage 2 bladder cancers. LET YOUR CAREGIVER KNOW ABOUT:  On the day of the procedure, your caregivers will need to know the last time you had anything to eat or drink. This includes water, gum, and candy. In advance, make sure they know about:   Any allergies.  All medications you are taking, including:  Herbs, eyedrops, over-the-counter medications and creams.  Blood thinners (anticoagulants), aspirin or other drugs that could affect blood clotting.  Use of steroids (by mouth or as creams).  Previous problems with anesthetics, including local anesthetics.  Possibility of pregnancy, if this applies.  Any history of blood clots.  Any history of bleeding or other blood problems.  Previous surgery.  Smoking history.  Any recent symptoms of colds or infections.  Other health problems. RISKS AND COMPLICATIONS This is usually a safe procedure. Every procedure has risks, though. For a transurethral resection, they include:  Infection. Antibiotic medication would need to be taken.  Bleeding.  Light bleeding may last for several days after the procedure.  If bleeding continues or is heavy, the bladder may need rinsing. Or, a new catheter might be put in for awhile.  Sometimes bed rest is needed.  Urination problems.  Pain and burning can occur when urinating. This usually goes away in a few days.  Scarring from the procedure can block the flow of urine.  Bladder damage.  It can be punctured or torn during removal of the tumor. If this happens, a catheter might be needed for longer. Antibiotics would be taken while the bladder heals.  Urine can leak through the hole or tear into the abdomen. If this happens, surgery may be needed to repair the bladder. BEFORE THE PROCEDURE   A medical evaluation will be done. This may include:  A physical  examination.  Urine test. This is to make sure you do not have a urinary tract infection.  Blood tests.  A test that checks the heart's rhythm (electrocardiogram).  Talking with an anesthesiologist. This is the person who will be in charge of the medication (anesthesia) to keep you from feeling pain during the transurethral resection. You might be asleep during the procedure (general anesthesia) or numb from the waist down, but awake during the procedure (spinal anesthesia). Ask your surgeon what to expect.  The person who is having a transurethral resection needs to give what is called informed consent. This requires signing a legal paper that gives permission for the procedure. To give informed consent:  You must understand how the procedure is done and why.  You must be told all the risks and benefits of the procedure.  You must sign the consent. Sometimes a legal guardian can do this.  Signing should be witnessed by a healthcare professional.  The day before the surgery, eat only a light dinner. Then,  do not eat or drink anything for at least 8 hours before the surgery. Ask your caregiver if it is OK to take any needed medicines with a sip of water.  Arrive at least an hour before the surgery or whenever your surgeon recommends. This will give you time to check in and fill out any needed paperwork. PROCEDURE  The preparation:  You will change into a hospital gown.  A needle will be inserted in your arm. This is an intravenous access tube (IV). Medication will be able to flow directly into your body through this needle.  Small monitors will be put on your body. They are used to check your heart, blood pressure, and oxygen level.  You might be given medication that will help you relax (sedative).  You will be given a general anesthetic or spinal anesthesia.  The procedure:  Once you are asleep or numb from the waist down, your legs will be placed in stirrups.  The  resectoscope will be passed through the urethra into the bladder.  Fluid will be passed through the resectoscope. This will fill the bladder with water.  The surgeon will examine the bladder through the scope. If the scope has a camera, it can take pictures from inside the bladder. They can be projected onto a TV screen.  The surgeon will use various tools to remove the tumor in small pieces. Sometimes a laser (a beam of light energy) is used. Other tools may use electric current.  A tube (catheter) will often be placed so that urine can drain into a bag outside the body. This process helps stop bleeding. This tube keeps blood clots from blocking the urethra.  The procedure usually takes 30 to 45 minutes. AFTER THE PROCEDURE   You will stay in a recovery area until the anesthesia has worn off. Your blood pressure and pulse will be checked every so often. Then you will be taken to a hospital room.  You may continue to get fluids through the IV for awhile.  Some pain is normal. The catheter might be uncomfortable. Pain is usually not severe. If it is, ask for pain medicine.  Your urine may look bloody after a transurethral resection. This is normal.  If bleeding is heavy, a hospital caregiver may rinse out the bladder (irrigation) through the catheter.  Once the urine is clear, the catheter will be taken out.  You will need to stay in the hospital until you can urinate on your own.  Most people stay in the hospital for up to 4 days. PROGNOSIS   Transurethral resection is considered the best way to treat bladder tumors that are not too far along. For most people, the treatment is successful. Sometimes, though, more treatment is needed.  Bladder cancers can come back even after a successful procedure. Because of this, be sure to have a checkup with your caregiver every 3 to 6 months. If everything is OK for 3 years, you can reduce the checkups to once a year. Document Released:  07/24/2009 Document Revised: 12/20/2011 Document Reviewed: 07/24/2009 [facility] Patient Information 2014 [place].  Post Anesthesia Home Care Instructions  Activity: Get plenty of rest for the remainder of the day. A responsible adult should stay with you for 24 hours following the procedure.  For the next 24 hours, DO NOT: -Drive a car -Paediatric nurse -Drink alcoholic beverages -Take any medication unless instructed by your physician -Make any legal decisions or sign important papers.  Meals: Start with liquid  foods such as gelatin or soup. Progress to regular foods as tolerated. Avoid greasy, spicy, heavy foods. If nausea and/or vomiting occur, drink only clear liquids until the nausea and/or vomiting subsides. Call your physician if vomiting continues.  Special Instructions/Symptoms: Your throat may feel dry or sore from the anesthesia or the breathing tube placed in your throat during surgery. If this causes discomfort, gargle with warm salt water. The discomfort should disappear within 24 hours.

## 2014-02-11 ENCOUNTER — Encounter (HOSPITAL_BASED_OUTPATIENT_CLINIC_OR_DEPARTMENT_OTHER): Payer: Self-pay | Admitting: Urology

## 2014-02-12 ENCOUNTER — Other Ambulatory Visit: Payer: Self-pay | Admitting: *Deleted

## 2014-02-12 MED ORDER — MONTELUKAST SODIUM 10 MG PO TABS: 10.0000 mg | ORAL_TABLET | Freq: Every day | ORAL | Status: DC

## 2014-02-13 ENCOUNTER — Telehealth: Payer: Self-pay | Admitting: Internal Medicine

## 2014-02-13 NOTE — Telephone Encounter (Signed)
Rec'd from [facility] and Throat forward to Dr.Norins

## 2014-02-15 ENCOUNTER — Telehealth: Payer: Self-pay | Admitting: Endocrinology

## 2014-02-15 DIAGNOSIS — D494 Neoplasm of unspecified behavior of bladder: Secondary | ICD-10-CM | POA: Diagnosis not present

## 2014-02-15 NOTE — Telephone Encounter (Signed)
Patient would like to know if he could restart his Invokana and Metformin   Please advise patient   Thank You :)

## 2014-02-18 DIAGNOSIS — H269 Unspecified cataract: Secondary | ICD-10-CM | POA: Diagnosis not present

## 2014-02-18 DIAGNOSIS — H2510 Age-related nuclear cataract, unspecified eye: Secondary | ICD-10-CM | POA: Diagnosis not present

## 2014-02-18 DIAGNOSIS — H2589 Other age-related cataract: Secondary | ICD-10-CM | POA: Diagnosis not present

## 2014-02-20 NOTE — Telephone Encounter (Signed)
Ok, please resume

## 2014-02-20 NOTE — Telephone Encounter (Signed)
See below. Pt states he was off of both of these medications for a month due to blood in his urine. 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  Thanks!

## 2014-02-20 NOTE — Telephone Encounter (Signed)
Pt advised to start medication again.

## 2014-02-26 ENCOUNTER — Telehealth: Payer: Self-pay | Admitting: Internal Medicine

## 2014-02-26 DIAGNOSIS — H2510 Age-related nuclear cataract, unspecified eye: Secondary | ICD-10-CM | POA: Diagnosis not present

## 2014-02-26 DIAGNOSIS — H6120 Impacted cerumen, unspecified ear: Secondary | ICD-10-CM | POA: Diagnosis not present

## 2014-02-26 NOTE — Telephone Encounter (Signed)
Rec'd from [facility] and Throat forward 5 pages to Dr. Linda Hedges

## 2014-02-27 ENCOUNTER — Ambulatory Visit (INDEPENDENT_AMBULATORY_CARE_PROVIDER_SITE_OTHER): Payer: Medicare Other | Admitting: Pharmacist

## 2014-02-27 ENCOUNTER — Other Ambulatory Visit: Payer: Self-pay | Admitting: *Deleted

## 2014-02-27 DIAGNOSIS — Z5181 Encounter for therapeutic drug level monitoring: Secondary | ICD-10-CM

## 2014-02-27 DIAGNOSIS — Z7901 Long term (current) use of anticoagulants: Secondary | ICD-10-CM | POA: Diagnosis not present

## 2014-02-27 LAB — POCT INR: INR: 2.2

## 2014-02-27 MED ORDER — METANX 3-90.314-2-35 MG PO CAPS: ORAL_CAPSULE | ORAL | Status: DC

## 2014-02-27 NOTE — Telephone Encounter (Signed)
Left msg on triage stating [facility] a notice stating they haven't received resposne back form md on renewal of his metanx, also need metformin. Requesting 90 day sent to [facility], and a call back once it has been done. Called pt back no answer [facility] will send the metanx, metformin is not on pt medication list md has him taking Januvia, not bale to send the metformin....Ralph Gray

## 2014-02-28 ENCOUNTER — Telehealth: Payer: Self-pay | Admitting: *Deleted

## 2014-02-28 NOTE — Telephone Encounter (Signed)
Spoke with pt advised of MDs message.  Scheduled pt appoint

## 2014-02-28 NOTE — Telephone Encounter (Signed)
They had to be stopped due to the renal issue Will need to recheck his A1C and renal tests before restarting

## 2014-02-28 NOTE — Telephone Encounter (Signed)
Pt called requesting refills on DM medications.  Review of his med list does not show DM medications.  Looks like they were dc'd by provider.  Please advise

## 2014-03-05 ENCOUNTER — Other Ambulatory Visit: Payer: Self-pay

## 2014-03-05 ENCOUNTER — Other Ambulatory Visit: Payer: Self-pay | Admitting: Endocrinology

## 2014-03-05 DIAGNOSIS — H353290 Exudative age-related macular degeneration, unspecified eye, stage unspecified: Secondary | ICD-10-CM | POA: Diagnosis not present

## 2014-03-05 DIAGNOSIS — H35059 Retinal neovascularization, unspecified, unspecified eye: Secondary | ICD-10-CM | POA: Diagnosis not present

## 2014-03-05 NOTE — Telephone Encounter (Signed)
Please advise if ok to refill. Invokana not on current medication list.  Thanks!

## 2014-03-06 ENCOUNTER — Encounter: Payer: Self-pay | Admitting: Internal Medicine

## 2014-03-06 ENCOUNTER — Other Ambulatory Visit (INDEPENDENT_AMBULATORY_CARE_PROVIDER_SITE_OTHER): Payer: Medicare Other

## 2014-03-06 ENCOUNTER — Ambulatory Visit (INDEPENDENT_AMBULATORY_CARE_PROVIDER_SITE_OTHER): Payer: Medicare Other | Admitting: Internal Medicine

## 2014-03-06 VITALS — BP 120/50 | HR 66 | Temp 97.6°F | Resp 16 | Ht 71.0 in | Wt 212.0 lb

## 2014-03-06 DIAGNOSIS — I2510 Atherosclerotic heart disease of native coronary artery without angina pectoris: Secondary | ICD-10-CM

## 2014-03-06 DIAGNOSIS — E1149 Type 2 diabetes mellitus with other diabetic neurological complication: Secondary | ICD-10-CM | POA: Diagnosis not present

## 2014-03-06 DIAGNOSIS — E785 Hyperlipidemia, unspecified: Secondary | ICD-10-CM | POA: Diagnosis not present

## 2014-03-06 DIAGNOSIS — I10 Essential (primary) hypertension: Secondary | ICD-10-CM | POA: Diagnosis not present

## 2014-03-06 LAB — BASIC METABOLIC PANEL
BUN: 24 mg/dL — AB (ref 6–23)
CO2: 23 meq/L (ref 19–32)
CREATININE: 1.5 mg/dL (ref 0.4–1.5)
Calcium: 9.6 mg/dL (ref 8.4–10.5)
Chloride: 108 mEq/L (ref 96–112)
GFR: 49.4 mL/min — ABNORMAL LOW (ref >60.00)
GLUCOSE: 134 mg/dL — AB (ref 70–99)
Potassium: 4.5 mEq/L (ref 3.5–5.1)
Sodium: 140 mEq/L (ref 135–145)

## 2014-03-06 LAB — HEMOGLOBIN A1C: Hgb A1c MFr Bld: 7 % — ABNORMAL HIGH (ref 4.6–6.5)

## 2014-03-06 MED ORDER — METFORMIN HCL 1000 MG PO TABS: 1000.0000 mg | ORAL_TABLET | Freq: Every day | ORAL | Status: DC

## 2014-03-06 NOTE — Assessment & Plan Note (Signed)
His blood sugars are well controlled Renal function is stable 

## 2014-03-06 NOTE — Assessment & Plan Note (Signed)
His BP is well controlled 

## 2014-03-06 NOTE — Progress Notes (Signed)
Subjective:    Patient ID: Ralph Gray, male    DOB: Apr 01, 1935, 79 y.o.   MRN: [id number]  Diabetes He presents for his follow-up diabetic visit. He has type 2 diabetes mellitus. His disease course has been stable. There are no hypoglycemic associated symptoms. Pertinent negatives for hypoglycemia include no confusion, dizziness, headaches or nervousness/anxiousness. Pertinent negatives for diabetes include no blurred vision, no chest pain, no fatigue, no foot paresthesias, no foot ulcerations, no polydipsia, no polyphagia, no polyuria, no visual change, no weakness and no weight loss. There are no hypoglycemic complications. Symptoms are stable. Diabetic complications include heart disease, nephropathy and peripheral neuropathy. Current diabetic treatment includes oral agent (triple therapy). He is compliant with treatment most of the time. His weight is stable. He is following a generally healthy diet. Meal planning includes avoidance of concentrated sweets. He has not had a previous visit with a dietician. He participates in exercise intermittently. His home blood glucose trend is decreasing steadily. His breakfast blood glucose range is generally 110-130 mg/dl. His lunch blood glucose range is generally 110-130 mg/dl. His dinner blood glucose range is generally 130-140 mg/dl. His highest blood glucose is 130-140 mg/dl. His overall blood glucose range is 130-140 mg/dl. An ACE inhibitor/angiotensin II receptor blocker is being taken. He does not see a podiatrist.Eye exam is current.      Review of Systems  Constitutional: Negative.  Negative for fever, chills, weight loss, diaphoresis, appetite change and fatigue.  HENT: Negative.   Eyes: Negative.  Negative for blurred vision.  Respiratory: Negative.  Negative for cough, choking, chest tightness, shortness of breath and stridor.   Cardiovascular: Negative.  Negative for chest pain, palpitations and leg swelling.  Gastrointestinal: Negative.   Negative for nausea, vomiting, abdominal pain, diarrhea, constipation and blood in stool.  Endocrine: Negative.  Negative for polydipsia, polyphagia and polyuria.  Genitourinary: Negative.   Musculoskeletal: Negative.  Negative for arthralgias, back pain and myalgias.  Skin: Negative.  Negative for rash.  Allergic/Immunologic: Negative.   Neurological: Negative.  Negative for dizziness, weakness, light-headedness, numbness and headaches.  Hematological: Negative.  Negative for adenopathy. Does not bruise/bleed easily.  Psychiatric/Behavioral: Negative for suicidal ideas, behavioral problems, confusion, dysphoric mood, decreased concentration and agitation. The patient is not nervous/anxious.        Objective:   Physical Exam  Vitals reviewed. Constitutional: He is oriented to person, place, and time. He appears well-developed and well-nourished. No distress.  HENT:  Head: Normocephalic and atraumatic.  Mouth/Throat: Oropharynx is clear and moist. No oropharyngeal exudate.  Eyes: Conjunctivae are normal. Right eye exhibits no discharge. Left eye exhibits no discharge. No scleral icterus.  Neck: Normal range of motion. Neck supple. No JVD present. No tracheal deviation present. No thyromegaly present.  Cardiovascular: Normal rate, regular rhythm and intact distal pulses.  Exam reveals no gallop and no friction rub.   No murmur heard. Pulmonary/Chest: Effort normal and breath sounds normal. No stridor. No respiratory distress. He has no wheezes. He has no rales. He exhibits no tenderness.  Abdominal: Soft. Bowel sounds are normal. He exhibits no distension and no mass. There is no tenderness. There is no rebound and no guarding.  Musculoskeletal: Normal range of motion. He exhibits no edema and no tenderness.  Lymphadenopathy:    He has no cervical adenopathy.  Neurological: He is oriented to person, place, and time.  Skin: Skin is warm and dry. No rash noted. He is not diaphoretic. No  erythema. No pallor.  Psychiatric: He has  a normal mood and affect. His behavior is normal. Judgment and thought content normal.     Lab Results  Component Value Date   WBC 10.9* 02/08/2014   HGB 13.7 02/08/2014   HCT 40.1 02/08/2014   PLT 125* 02/08/2014   GLUCOSE 182* 02/08/2014   CHOL 127 07/11/2013   TRIG 257.0* 07/11/2013   HDL 43.50 07/11/2013   LDLDIRECT 59.4 07/11/2013   LDLCALC 60 10/14/2011   ALT 39 01/03/2014   AST 29 01/03/2014   NA 140 02/08/2014   K 4.6 02/08/2014   CL 103 02/08/2014   CREATININE 1.16 02/08/2014   BUN 21 02/08/2014   CO2 23 02/08/2014   PSA 0.06 01/07/2014   INR 2.2 02/27/2014   HGBA1C 6.9* 12/03/2013   MICROALBUR 31.6* 07/30/2013       Assessment & Plan:

## 2014-03-06 NOTE — Assessment & Plan Note (Signed)
He has achieved his LDL goal 

## 2014-03-06 NOTE — Progress Notes (Signed)
Pre visit review using our clinic review tool, if applicable. No additional management support is needed unless otherwise documented below in the visit note. 

## 2014-03-07 ENCOUNTER — Telehealth: Payer: Self-pay | Admitting: Internal Medicine

## 2014-03-07 NOTE — Telephone Encounter (Signed)
Relevant patient education assigned to patient using Emmi. ° °

## 2014-03-11 DIAGNOSIS — H21569 Pupillary abnormality, unspecified eye: Secondary | ICD-10-CM | POA: Diagnosis not present

## 2014-03-11 DIAGNOSIS — H269 Unspecified cataract: Secondary | ICD-10-CM | POA: Diagnosis not present

## 2014-03-11 DIAGNOSIS — H2589 Other age-related cataract: Secondary | ICD-10-CM | POA: Diagnosis not present

## 2014-03-11 DIAGNOSIS — H2510 Age-related nuclear cataract, unspecified eye: Secondary | ICD-10-CM | POA: Diagnosis not present

## 2014-03-13 ENCOUNTER — Encounter (HOSPITAL_BASED_OUTPATIENT_CLINIC_OR_DEPARTMENT_OTHER): Payer: Self-pay | Admitting: Urology

## 2014-03-13 ENCOUNTER — Telehealth: Payer: Self-pay

## 2014-03-13 DIAGNOSIS — E1149 Type 2 diabetes mellitus with other diabetic neurological complication: Secondary | ICD-10-CM

## 2014-03-13 MED ORDER — METFORMIN HCL 1000 MG PO TABS: ORAL_TABLET | ORAL | Status: DC

## 2014-03-13 NOTE — Telephone Encounter (Signed)
Received fax from pharmacy requesting clarification due to two sets of directions on script. Clrified BID for metformin

## 2014-03-15 ENCOUNTER — Other Ambulatory Visit: Payer: Self-pay

## 2014-03-15 MED ORDER — TAMSULOSIN HCL 0.4 MG PO CAPS: ORAL_CAPSULE | ORAL | Status: DC

## 2014-03-18 DIAGNOSIS — R310 Gross hematuria: Secondary | ICD-10-CM | POA: Diagnosis not present

## 2014-03-22 ENCOUNTER — Ambulatory Visit (INDEPENDENT_AMBULATORY_CARE_PROVIDER_SITE_OTHER): Payer: Medicare Other | Admitting: Endocrinology

## 2014-03-22 ENCOUNTER — Encounter: Payer: Self-pay | Admitting: Endocrinology

## 2014-03-22 VITALS — BP 106/66 | HR 68 | Temp 97.8°F | Ht 71.0 in | Wt 212.0 lb

## 2014-03-22 DIAGNOSIS — I2510 Atherosclerotic heart disease of native coronary artery without angina pectoris: Secondary | ICD-10-CM

## 2014-03-22 DIAGNOSIS — E119 Type 2 diabetes mellitus without complications: Secondary | ICD-10-CM | POA: Diagnosis not present

## 2014-03-22 MED ORDER — SITAGLIPTIN PHOSPHATE 100 MG PO TABS: ORAL_TABLET | ORAL | Status: DC

## 2014-03-22 MED ORDER — CANAGLIFLOZIN 100 MG PO TABS
1.0000 | ORAL_TABLET | Freq: Every day | ORAL | Status: DC
Start: 2014-03-22 — End: 2014-08-02

## 2014-03-22 NOTE — Patient Instructions (Addendum)
check your blood sugar once a day.  vary the time of day when you check, between before the 3 meals, and at bedtime.  also check if you have symptoms of your blood sugar being too high or too low.  please keep a record of the readings and bring it to your next appointment here.  please call us sooner if your blood sugar goes below 70, or if you have a lot of readings over 200.   A blood sugar test is being requested for you today.  We'll contact you with results.  Please come back for a follow-up appointment in 4 months.

## 2014-03-22 NOTE — Progress Notes (Signed)
Subjective:    Patient ID: Ralph Gray, male    DOB: [birth year], 79 y.o.   MRN: [id number]  HPI pt returns for f/u of type 2 DM (dx'ed 2010; he has moderate neuropathy of the lower extremities, and associated foot ulcer, CAD and PAD; he has never been on insulin; he takes 4 oral meds; he can't take actos due to edema).  no cbg record, but states cbg's are in the low-100's.  pt states he feels better in general, since his recovery from a urologic problem.   Past Medical History  Diagnosis Date  . Hypertension   . Hyperlipidemia   . Peripheral neuropathy   . Macular degeneration BILATERAL    TX--- intra-orbital injections. HX  laser treatment  . Type II diabetes mellitus   . Bilateral carotid artery stenosis     PER DUPLEX 08-07-2013--  BILATERAL ICA  40-59% STENOSIS  . Anticoagulated on Coumadin   . History of prostate cancer     DX  2010--  S/P EXTERNAL RADIATION THERAPY  . Bladder tumor   . PAF (paroxysmal atrial fibrillation)     DX  2006  . First degree heart block   . Coronary artery disease CARDIOLOGIST --  DR Burt Knack    MI '99   S/P CABG  . History of MI (myocardial infarction)     1999  --  S/P CABG  . S/P CABG x 4     1999  IN [place]  . Cataract of both eyes     RESCHEDULED FOR EXTRACTIONS  MAY 2015  . Renal insufficiency   . Personal history of CLL (chronic lymphocytic leukemia)     PER LAB RESULTS IN 2010--  NO TX  ONLY BEING MONITORED  . History of hepatitis C virus infection     1990's  per lab results  --  asymptomatic ,  no tx  . Wears glasses   . At risk for sleep apnea     STOP-BANG=  5   //SENT TO PCP  02-06-2014  . Nocturia     Past Surgical History  Procedure Laterality Date  . Cardioversion  2008  . Cardiac catheterization  08-29-2012   DR COOPER    POST CABG--  PATENT LIMA to  LAD,  free RIMA  to acute marginal branch of the RCA,  &  SVG to DIAGONAL/  TOTAL OCCLUSION OF SVG to distal RCA/  MODERATE SEGMENTAL LVSF/  80% STENOSIS  pCFX  (which was not grafted)  . Transthoracic echocardiogram  08-01-2012  dr cooper    GRADE I DIASTOLIC DYSFUNCTION/  EF 55-60%/  MODERATE LAE/  AORTIC SCLEROSIS WITHOUT STENOSIS/  MILD TR  . Coronary artery bypass graft  1999    CABG X4;  in [place]  . Transurethral resection of bladder tumor N/A 02/08/2014    Procedure: TRANSURETHRAL RESECTION OF BLADDER TUMOR (TURBT) WITH GYRUS ;  Surgeon: [name], MD;  Location: [facility];  Service: Urology;  Laterality: N/A;    History   Social History  . Marital Status: Married    Spouse Name: N/A    Number of Children: N/A  . Years of Education: N/A   Occupational History  . Retired    Social History Main Topics  . Smoking status: Former Smoker -- 2.50 packs/day for 9 years    Types: Cigarettes    Quit date: 10/14/1963  . Smokeless tobacco: Never Used  . Alcohol Use: No  . Drug Use: No  .  Sexual Activity: Not on file   Other Topics Concern  . Not on file   Social History Narrative   [employer], [employer], [place] - doctorate in education. Married '64 - 2yr/divorced ( severe bipolar disease); Married '07. 2 sons - '67, '66; 1 dtr - '70. 7 grandchildren. Exercise - 6 days a week: cardio and strengthening. 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 ACP- yes for CPR; no -long term ventilation; no heroic or futile measures.     Current Outpatient Prescriptions on File Prior to Visit  Medication Sig Dispense Refill  . aspirin 81 MG tablet Take 81 mg by mouth daily.        Marland Kitchen atorvastatin (LIPITOR) 10 MG tablet Take 10 mg by mouth every evening.      . bromocriptine (PARLODEL) 2.5 MG tablet TAKE 1 TABLET DAILY---  TAKES IN AM      . clotrimazole (LOTRIMIN) 1 % cream Apply 1 application topically 3 (three) times daily. As needed for rash  60 g  3  . felodipine (PLENDIL) 5 MG 24 hr tablet Take 5 mg by mouth every morning. TAKE 1 TABLET DAILY      . fluticasone (FLONASE) 50 MCG/ACT  nasal spray USE 2 SPRAYS NASALLY DAILY  48 g  3  . L-Methylfolate-Algae-B12-B6 (METANX) 3-90.314-2-35 MG CAPS TAKE 1 CAPSULE DAILY  90 capsule  3  . losartan (COZAAR) 50 MG tablet Take 1 tablet (50 mg total) by mouth 2 (two) times daily.  180 tablet  3  . metFORMIN (GLUCOPHAGE) 1000 MG tablet 2 tabs po daily  180 tablet  1  . montelukast (SINGULAIR) 10 MG tablet Take 1 tablet (10 mg total) by mouth at bedtime.  90 tablet  3  . pregabalin (LYRICA) 75 MG capsule Take 1 capsule (75 mg total) by mouth 2 (two) times daily.  180 capsule  1  . tamsulosin (FLOMAX) 0.4 MG CAPS capsule TAKE 1 CAPSULE TWICE A DAY  180 capsule  2  . warfarin (COUMADIN) 6 MG tablet TAKE AS DIRECTED BY COUMADIN CLINIC  130 tablet  0  . [DISCONTINUED] multivitamin (METANX) 3-35-2 MG TABS tablet Take 1 tablet by mouth daily.  90 tablet  1   No current facility-administered medications on file prior to visit.    Allergies  Allergen Reactions  . Amoxicillin Anaphylaxis  . Other Other (See Comments)    Muscle Relaxers; "I get very woozy"    Family History  Problem Relation Age of Onset  . Heart disease Mother 4    died  . Breast cancer Mother     sarcoma  . Heart attack Father 51    died  . Heart disease Father     MI  . Diabetes Neg Hx   . COPD Neg Hx     BP 106/66  Pulse 68  Temp(Src) 97.8 F (36.6 C) (Oral)  Ht 5\' 11"  (1.803 m)  Wt 212 lb (96.163 kg)  BMI 29.58 kg/m2  SpO2 93%  Review of Systems He denies weight change and hypoglycemia.      Objective:   Physical Exam EXTEMITIES: 1+ bilat edema. There are bilat varicosities, and rust-colored skin. There is bilateral onychomycosis.  Pulses: dorsalis pedis intact bilat.  NEURO: sensation is intact to touch, but decreased from normal on the feet.   Lab Results  Component Value Date   HGBA1C 7.0* 03/06/2014   Lab Results  Component Value Date   CREATININE 1.5 03/06/2014  BUN 24* 03/06/2014   NA 140 03/06/2014   K 4.5 03/06/2014   CL 108  03/06/2014   CO2 23 03/06/2014       Assessment & Plan:  DM: well-controlled.   Patient is advised the following: Please continue the same medications

## 2014-03-24 ENCOUNTER — Other Ambulatory Visit: Payer: Self-pay | Admitting: Cardiovascular Disease

## 2014-04-01 ENCOUNTER — Ambulatory Visit: Payer: Medicare Other | Admitting: Endocrinology

## 2014-04-04 ENCOUNTER — Ambulatory Visit (INDEPENDENT_AMBULATORY_CARE_PROVIDER_SITE_OTHER): Payer: Medicare Other | Admitting: *Deleted

## 2014-04-04 DIAGNOSIS — Z5181 Encounter for therapeutic drug level monitoring: Secondary | ICD-10-CM | POA: Diagnosis not present

## 2014-04-04 DIAGNOSIS — Z7901 Long term (current) use of anticoagulants: Secondary | ICD-10-CM | POA: Diagnosis not present

## 2014-04-04 LAB — POCT INR: INR: 3

## 2014-04-08 DIAGNOSIS — I25810 Atherosclerosis of coronary artery bypass graft(s) without angina pectoris: Secondary | ICD-10-CM | POA: Diagnosis not present

## 2014-04-17 ENCOUNTER — Ambulatory Visit (INDEPENDENT_AMBULATORY_CARE_PROVIDER_SITE_OTHER): Payer: Medicare Other | Admitting: Podiatry

## 2014-04-17 ENCOUNTER — Encounter: Payer: Self-pay | Admitting: Podiatry

## 2014-04-17 VITALS — BP 128/60 | HR 60 | Resp 12

## 2014-04-17 DIAGNOSIS — Q828 Other specified congenital malformations of skin: Secondary | ICD-10-CM

## 2014-04-17 DIAGNOSIS — M79673 Pain in unspecified foot: Secondary | ICD-10-CM

## 2014-04-17 DIAGNOSIS — B351 Tinea unguium: Secondary | ICD-10-CM | POA: Diagnosis not present

## 2014-04-17 DIAGNOSIS — M79609 Pain in unspecified limb: Secondary | ICD-10-CM | POA: Diagnosis not present

## 2014-04-17 NOTE — Progress Notes (Signed)
Patient ID: Ralph Gray, male   DOB: Aug 17, 1935, 79 y.o.   MRN: [id number]  Subjective: Orientated x3 white male presents today complaining of painful toenails and a plantar keratoses on the left foot. He describes a history of hyperbaric therapy to resolve the ulcer in the fifth MPJ area in the left foot  Objective: Elongated, hypertrophic, discolored toenails 6-10 Punctate hemorrhagic keratoses plantar fifth MPJ  Assessment: Symptomatic onychomycoses 6-10 Keratoses plantar fifth MPJ, pre-ulcerative  Plan: Nail 6-10 and keratoses x1 debrided without a bleeding  Patient is advised to wear her existing diabetic shoes with diabetic innersoles on a regular basis  Reappoint x61 days

## 2014-04-17 NOTE — Patient Instructions (Signed)
Wear diabetic shoes with diabetic innersoles

## 2014-04-24 ENCOUNTER — Other Ambulatory Visit: Payer: Self-pay | Admitting: *Deleted

## 2014-04-24 MED ORDER — FLUTICASONE PROPIONATE 50 MCG/ACT NA SUSP: NASAL | Status: DC

## 2014-04-24 NOTE — Telephone Encounter (Signed)
Pt is requesting new script on his [facility] Inform pt we will send...Ralph Gray

## 2014-05-10 ENCOUNTER — Other Ambulatory Visit: Payer: Self-pay | Admitting: Endocrinology

## 2014-05-16 ENCOUNTER — Ambulatory Visit (INDEPENDENT_AMBULATORY_CARE_PROVIDER_SITE_OTHER): Payer: Medicare Other | Admitting: Pharmacist

## 2014-05-16 DIAGNOSIS — H353290 Exudative age-related macular degeneration, unspecified eye, stage unspecified: Secondary | ICD-10-CM | POA: Diagnosis not present

## 2014-05-16 DIAGNOSIS — Z7901 Long term (current) use of anticoagulants: Secondary | ICD-10-CM

## 2014-05-16 DIAGNOSIS — H35429 Microcystoid degeneration of retina, unspecified eye: Secondary | ICD-10-CM | POA: Diagnosis not present

## 2014-05-16 DIAGNOSIS — Z5181 Encounter for therapeutic drug level monitoring: Secondary | ICD-10-CM

## 2014-05-16 DIAGNOSIS — H35349 Macular cyst, hole, or pseudohole, unspecified eye: Secondary | ICD-10-CM | POA: Diagnosis not present

## 2014-05-16 LAB — POCT INR: INR: 3.4

## 2014-05-30 ENCOUNTER — Ambulatory Visit (INDEPENDENT_AMBULATORY_CARE_PROVIDER_SITE_OTHER): Payer: Medicare Other | Admitting: Podiatry

## 2014-05-30 ENCOUNTER — Encounter: Payer: Self-pay | Admitting: Podiatry

## 2014-05-30 VITALS — BP 126/60 | HR 61 | Resp 16

## 2014-05-30 DIAGNOSIS — I2510 Atherosclerotic heart disease of native coronary artery without angina pectoris: Secondary | ICD-10-CM

## 2014-05-30 DIAGNOSIS — B351 Tinea unguium: Secondary | ICD-10-CM | POA: Diagnosis not present

## 2014-05-30 DIAGNOSIS — C679 Malignant neoplasm of bladder, unspecified: Secondary | ICD-10-CM | POA: Diagnosis not present

## 2014-06-02 NOTE — Progress Notes (Signed)
Subjective:     Patient ID: Ralph Gray, male   DOB: [birth year], 79 y.o.   MRN: [id number]  HPI patient presents stating I was concerned because of discoloration of the second nails of both feet. Admits that he had been wearing different shoes and has been quite active   Review of Systems     Objective:   Physical Exam Neurovascular status unchanged with nail disease second of both feet with slight breakdown on the distal portion second toe left foot that is localized in nature with no proximal erythema edema or drainage noted    Assessment:     Damaged second nails of both feet with mild drainage distal second toe secondary to,    Plan:     Debris did tissue and advised on soaks and the fact that this was trauma in its orientation. Should not be a long-term problem

## 2014-06-03 ENCOUNTER — Ambulatory Visit (INDEPENDENT_AMBULATORY_CARE_PROVIDER_SITE_OTHER): Payer: Medicare Other | Admitting: Podiatry

## 2014-06-03 ENCOUNTER — Encounter: Payer: Self-pay | Admitting: Podiatry

## 2014-06-03 VITALS — BP 151/63 | HR 60 | Temp 97.8°F | Resp 14 | Ht 71.0 in | Wt 210.0 lb

## 2014-06-03 DIAGNOSIS — I2510 Atherosclerotic heart disease of native coronary artery without angina pectoris: Secondary | ICD-10-CM | POA: Diagnosis not present

## 2014-06-03 DIAGNOSIS — L03119 Cellulitis of unspecified part of limb: Secondary | ICD-10-CM | POA: Diagnosis not present

## 2014-06-03 DIAGNOSIS — L02619 Cutaneous abscess of unspecified foot: Secondary | ICD-10-CM | POA: Diagnosis not present

## 2014-06-03 MED ORDER — SULFAMETHOXAZOLE-TMP DS 800-160 MG PO TABS
1.0000 | ORAL_TABLET | Freq: Two times a day (BID) | ORAL | Status: DC
Start: 2014-06-03 — End: 2014-08-02

## 2014-06-03 NOTE — Progress Notes (Signed)
   Subjective:    Patient ID: Ralph Gray, male    DOB: 1935-02-23, 79 y.o.   MRN: [id number]  HPI Comments: Pt states he had some swelling in the left 5th MPJ this morning, and after working out a blister that ruptured.     Review of Systems  All other systems reviewed and are negative.      Objective:   Physical Exam        Assessment & Plan:

## 2014-06-04 NOTE — Progress Notes (Signed)
Subjective:     Patient ID: Ralph Gray, male   DOB: [birth year], 80 y.o.   MRN: [id number]  HPI patient states that I don't know what I did to my foot but after working out this morning it started to bleed and I was worried   Review of Systems     Objective:   Physical Exam Long-term diabetic who does have a history of breakdown around the fifth metatarsal left to have some redness and drainage in this area that is localized with no proximal edema erythema or drainage noted. No systemic temperature is noted he has no history of chills or indications of systemic infection    Assessment:     Localized abscess fifth metatarsal left secondary to intense activity    Plan:     Reviewed his activity levels and today using sharp sterile his mentation I did and released the abscess from the left fifth metatarsal salt drainage and went ahead and culture this. It appears to be localized and I cleaned the area up flushed packed with Silvadene and applied sterile dressing. Instructed on soaks to do at home and placed him on Septra DS and gave him strict instructions if he should develop any increase in redness proximal redness or any systemic signs of infection he is to call us immediately or go to the emergency room and may require IV antibiotic treatment. Because we are catching this so early it should be self-limiting but again I did give him these instructions should any change occur

## 2014-06-06 ENCOUNTER — Ambulatory Visit: Payer: Self-pay

## 2014-06-06 ENCOUNTER — Ambulatory Visit (INDEPENDENT_AMBULATORY_CARE_PROVIDER_SITE_OTHER): Payer: Medicare Other | Admitting: Podiatry

## 2014-06-06 VITALS — BP 141/61 | HR 59 | Temp 97.9°F | Resp 17

## 2014-06-06 DIAGNOSIS — L97509 Non-pressure chronic ulcer of other part of unspecified foot with unspecified severity: Secondary | ICD-10-CM

## 2014-06-06 DIAGNOSIS — I2510 Atherosclerotic heart disease of native coronary artery without angina pectoris: Secondary | ICD-10-CM

## 2014-06-06 NOTE — Progress Notes (Signed)
   Subjective:    Patient ID: Ralph Gray, male    DOB: 1934-11-17, 79 y.o.   MRN: [id number]  HPI  Pt presents fro recheck of ulcer on left foot, noted mild swelling and serosang drainage, afebrile  Review of Systems     Objective:   Physical Exam        Assessment & Plan:

## 2014-06-06 NOTE — Progress Notes (Signed)
Subjective:     Patient ID: Ralph Gray, male   DOB: 1934-10-18, 79 y.o.   MRN: [id number]  HPI patient states he is going out of town this weekend and wanted to have his foot checked before he left   Review of Systems     Objective:   Physical Exam Neurovascular status is intact with healing tissue plantar and lateral aspect left fifth metatarsal secondary to abscess with no current drainage no odor no proximal erythema edema noted.    Assessment:     Appears to be healing well from abscess tissue left plantar and lateral fifth metatarsal    Plan:     Continue antibiotic continue soaks and today I reapplied sterile dressing. If any increase in redness were to occur he is to see a doctor at the [facility] or any other issues should occur he is to go to the emergency room

## 2014-06-12 ENCOUNTER — Telehealth: Payer: Self-pay | Admitting: *Deleted

## 2014-06-12 NOTE — Telephone Encounter (Signed)
I'm a patient of Dr. Paulla Dolly.  He put me on a sulfa type medicine, antibiotic.  I've taken the whole thing but now I've broken out with a rash over my top chest and my scalp.  I want to know what I can do to alleviate that condition and also is there some way he can order me some Silver Nitrate that I can put on my foot because it has started bleeding again?  I'd appreciate a call as soon as possible.  Thank you.

## 2014-06-12 NOTE — Telephone Encounter (Signed)
I have a rash over my chest, scalp and my feet.  My foot started bleeding again this morning left foot.  Let me let you speak to my wife.  She can tell you better than I can.  Opal Sidles got on the phone.  We are on vacation down at [facility].  He finished up his antibiotic yesterday.  He was out to the beach yesterday under an umbrella.  He's been soaking each night in [facility] and putting Neosporin on it.  He has a new place that has developed.  The other one had healed after going to the [place] but has opened up slightly.  I advised her that he may be able to use Benadryl for the rash but I will check with Dr. Paulla Dolly to make sure.  I told her he probably needs to be seen, may want to consider going to an Urgent Care.  She stated they used to live there so if need be they can go see his old Podiatrist.  She stated we wanted to see what Dr. Paulla Dolly suggests first before making an appointment.

## 2014-06-12 NOTE — Telephone Encounter (Signed)
I called and informed him that Dr. Paulla Dolly said for him to go see a Podiatrist in [facility].  He asked what about the rash, did he say use the Benadryl or anything.  I told him yes, as long as you can tolerate it.  He stated so see someone here.  I stated yes, see a Podiatrist there.  He stated okay thanks for calling, we'll see you next week.

## 2014-06-13 DIAGNOSIS — E1149 Type 2 diabetes mellitus with other diabetic neurological complication: Secondary | ICD-10-CM | POA: Diagnosis not present

## 2014-06-13 DIAGNOSIS — L8993 Pressure ulcer of unspecified site, stage 3: Secondary | ICD-10-CM | POA: Diagnosis not present

## 2014-06-13 DIAGNOSIS — G5790 Unspecified mononeuropathy of unspecified lower limb: Secondary | ICD-10-CM | POA: Diagnosis not present

## 2014-06-13 DIAGNOSIS — L97409 Non-pressure chronic ulcer of unspecified heel and midfoot with unspecified severity: Secondary | ICD-10-CM | POA: Diagnosis not present

## 2014-06-19 ENCOUNTER — Ambulatory Visit (INDEPENDENT_AMBULATORY_CARE_PROVIDER_SITE_OTHER): Payer: Medicare Other | Admitting: Podiatry

## 2014-06-19 ENCOUNTER — Encounter: Payer: Self-pay | Admitting: Podiatry

## 2014-06-19 DIAGNOSIS — L03119 Cellulitis of unspecified part of limb: Secondary | ICD-10-CM

## 2014-06-19 DIAGNOSIS — I2510 Atherosclerotic heart disease of native coronary artery without angina pectoris: Secondary | ICD-10-CM

## 2014-06-19 DIAGNOSIS — L02619 Cutaneous abscess of unspecified foot: Secondary | ICD-10-CM

## 2014-06-19 MED ORDER — CADEXOMER IODINE 0.9 % EX GEL: 1.0000 "application " | Freq: Every day | CUTANEOUS | Status: DC | PRN

## 2014-06-19 NOTE — Patient Instructions (Signed)
Diabetes and Foot Care Diabetes may cause you to have problems because of poor blood supply (circulation) to your feet and legs. This may cause the skin on your feet to become thinner, break easier, and heal more slowly. Your skin may become dry, and the skin may peel and crack. You may also have nerve damage in your legs and feet causing decreased feeling in them. You may not notice minor injuries to your feet that could lead to infections or more serious problems. Taking care of your feet is one of the most important things you can do for yourself.  HOME CARE INSTRUCTIONS  Wear shoes at all times, even in the house. Do not go barefoot. Bare feet are easily injured.  Check your feet daily for blisters, cuts, and redness. If you cannot see the bottom of your feet, use a mirror or ask someone for help.  Wash your feet with warm water (do not use hot water) and mild soap. Then pat your feet and the areas between your toes until they are completely dry. Do not soak your feet as this can dry your skin.  Apply a moisturizing lotion or petroleum jelly (that does not contain alcohol and is unscented) to the skin on your feet and to dry, brittle toenails. Do not apply lotion between your toes.  Trim your toenails straight across. Do not dig under them or around the cuticle. File the edges of your nails with an emery board or nail file.  Do not cut corns or calluses or try to remove them with medicine.  Wear clean socks or stockings every day. Make sure they are not too tight. Do not wear knee-high stockings since they may decrease blood flow to your legs.  Wear shoes that fit properly and have enough cushioning. To break in new shoes, wear them for just a few hours a day. This prevents you from injuring your feet. Always look in your shoes before you put them on to be sure there are no objects inside.  Do not cross your legs. This may decrease the blood flow to your feet.  If you find a minor scrape,  cut, or break in the skin on your feet, keep it and the skin around it clean and dry. These areas may be cleansed with mild soap and water. Do not cleanse the area with peroxide, alcohol, or iodine.  When you remove an adhesive bandage, be sure not to damage the skin around it.  If you have a wound, look at it several times a day to make sure it is healing.  Do not use heating pads or hot water bottles. They may burn your skin. If you have lost feeling in your feet or legs, you may not know it is happening until it is too late.  Make sure your health care provider performs a complete foot exam at least annually or more often if you have foot problems. Report any cuts, sores, or bruises to your health care provider immediately. SEEK MEDICAL CARE IF:   You have an injury that is not healing.  You have cuts or breaks in the skin.  You have an ingrown nail.  You notice redness on your legs or feet.  You feel burning or tingling in your legs or feet.  You have pain or cramps in your legs and feet.  Your legs or feet are numb.  Your feet always feel cold. SEEK IMMEDIATE MEDICAL CARE IF:   There is increasing redness,   swelling, or pain in or around a wound.  There is a red line that goes up your leg.  Pus is coming from a wound.  You develop a fever or as directed by your health care provider.  You notice a bad smell coming from an ulcer or wound. Document Released: 09/24/2000 Document Revised: 05/30/2013 Document Reviewed: 03/06/2013 Exit[employer] Patient Information 2015 [employer]. This information is not intended to replace advice given to you by your health care provider. Make sure you discuss any questions you have with your health care provider.  

## 2014-06-20 NOTE — Progress Notes (Signed)
Subjective:     Patient ID: Ralph Gray, male   DOB: 08-23-1935, 79 y.o.   MRN: [id number]  HPI patient presents stating he was at the beach and developed irritation around his fifth metatarsal head of his left foot again and while it's not been red and swollen he knows that it's been opened   Review of Systems     Objective:   Physical Exam Long-term diabetic with neuro pathic-like symptomatology who has an open area plantar aspect left fifth metatarsal measuring approximately 8 x 8 mm with a small area of abscess tissue in the open area itself. There is no proximal edema erythema or drainage noted and it all appears to be localized at this time    Assessment:     Chronic long-term neuropathy leading to breakdown of skin with probable exposure and prominence of the fifth metatarsal head left foot. No indications of systemic infection    Plan:     Educated him on this and saw this patient with Dr. Jacqualyn Posey. Today I went ahead and I cleaned up some of the abscess tissue with sterile instruments flushed the area out and applied Iodosorb with instructions on usage to try to dry. We will continue Iodosorb usage at home and I instructed that I do not want him bearing weight on this portion of his foot and will use his wedge shoe or padding completely around the area and also I do not want him exercising. Patient will be seen back in 1 week if any redness should occur any swelling drainage or systemic signs of infection he is to let us know immediately or go to the emergency room

## 2014-06-21 DIAGNOSIS — H353290 Exudative age-related macular degeneration, unspecified eye, stage unspecified: Secondary | ICD-10-CM | POA: Diagnosis not present

## 2014-06-21 DIAGNOSIS — H35349 Macular cyst, hole, or pseudohole, unspecified eye: Secondary | ICD-10-CM | POA: Diagnosis not present

## 2014-06-25 ENCOUNTER — Telehealth: Payer: Self-pay | Admitting: *Deleted

## 2014-06-25 NOTE — Telephone Encounter (Signed)
Received message from pt's pharmacy that he was on Bactrim DS and he is on coumadin .Called pt and he states he finished Bactrim Ds on Monday Sept 14th  Made an appt for him to be seen on Wednesday Sept 16th in coumadin clinic and he states understanding

## 2014-06-26 ENCOUNTER — Ambulatory Visit (INDEPENDENT_AMBULATORY_CARE_PROVIDER_SITE_OTHER): Payer: Medicare Other | Admitting: *Deleted

## 2014-06-26 DIAGNOSIS — Z5181 Encounter for therapeutic drug level monitoring: Secondary | ICD-10-CM

## 2014-06-26 DIAGNOSIS — Z7901 Long term (current) use of anticoagulants: Secondary | ICD-10-CM | POA: Diagnosis not present

## 2014-06-26 LAB — POCT INR: INR: 2.3

## 2014-06-27 ENCOUNTER — Encounter: Payer: Self-pay | Admitting: Podiatry

## 2014-06-27 ENCOUNTER — Other Ambulatory Visit: Payer: Self-pay

## 2014-06-27 ENCOUNTER — Ambulatory Visit (INDEPENDENT_AMBULATORY_CARE_PROVIDER_SITE_OTHER): Payer: Medicare Other | Admitting: Podiatry

## 2014-06-27 VITALS — BP 117/59 | HR 72 | Resp 16

## 2014-06-27 DIAGNOSIS — L97509 Non-pressure chronic ulcer of other part of unspecified foot with unspecified severity: Secondary | ICD-10-CM | POA: Diagnosis not present

## 2014-06-27 DIAGNOSIS — M79609 Pain in unspecified limb: Secondary | ICD-10-CM

## 2014-06-27 MED ORDER — ATORVASTATIN CALCIUM 10 MG PO TABS: 10.0000 mg | ORAL_TABLET | Freq: Every evening | ORAL | Status: DC

## 2014-06-27 NOTE — Progress Notes (Signed)
Subjective:     Patient ID: Ralph Gray, male   DOB: 27-Apr-1935, 79 y.o.   MRN: [id number]  HPI patient presents with continued breakdown around the fifth metatarsal head left that is localized in nature and is being treated by his wife who helps with his condition and is here with him today   Review of Systems     Objective:   Physical Exam Neurovascular status unchanged with inflammatory lesion left fifth metatarsal but is broken down and measures approximately 4 x 4 mm. It is localized with no proximal edema erythema or drainage or odor noted    Assessment:     Chronic pressure on the fifth metatarsal head left leading to chronic breakdown of tissue with possibility for underlying infection    Plan:     Currently it is stable with no indications of soft tissue or bone infective process and I cleaned the area up flushed and applied Iodosorb with occlusion. We discussed continued immobilization and also they are to go ahead and get an appointment with the wound care center to get a second opinion of this problem. I explained if any proximal edema erythema drainage or systemic signs of infection should occur patient is to go straight to the emergency room

## 2014-07-04 ENCOUNTER — Ambulatory Visit (INDEPENDENT_AMBULATORY_CARE_PROVIDER_SITE_OTHER): Payer: Medicare Other

## 2014-07-04 ENCOUNTER — Encounter: Payer: Self-pay | Admitting: Podiatry

## 2014-07-04 ENCOUNTER — Ambulatory Visit: Payer: Medicare Other | Admitting: Podiatry

## 2014-07-04 VITALS — BP 158/63 | HR 55 | Temp 96.8°F | Resp 15 | Ht 71.0 in | Wt 210.0 lb

## 2014-07-04 DIAGNOSIS — L02619 Cutaneous abscess of unspecified foot: Secondary | ICD-10-CM

## 2014-07-04 DIAGNOSIS — L03119 Cellulitis of unspecified part of limb: Secondary | ICD-10-CM | POA: Diagnosis not present

## 2014-07-04 DIAGNOSIS — R52 Pain, unspecified: Secondary | ICD-10-CM | POA: Diagnosis not present

## 2014-07-04 DIAGNOSIS — L97509 Non-pressure chronic ulcer of other part of unspecified foot with unspecified severity: Secondary | ICD-10-CM

## 2014-07-04 DIAGNOSIS — I2510 Atherosclerotic heart disease of native coronary artery without angina pectoris: Secondary | ICD-10-CM

## 2014-07-04 NOTE — Progress Notes (Signed)
Subjective:     Patient ID: Ralph Gray, male   DOB: Dec 26, 1934, 79 y.o.   MRN: [id number]  HPI patient presents stating that I still have this opening on my left foot and I'm concerned about going to [place]   Review of Systems     Objective:   Physical Exam Neurovascular status unchanged with patient well oriented x3 and presenting with wife. Patient has an area plantar aspect fifth metatarsal left measuring about 7 x 7 mm that is now superficial with no subcutaneous significant exposure no erythema edema or drainage surrounding the area    Assessment:     Ulceration left fifth metatarsal that's gradually improving but slowly with no indications of infection    Plan:     Cleaned and debris did the area and applied Iodosorb with padding. Did x-ray the foot and reviewed findings with wife and patient and advised on the importance of continued nonweightbearing of the area. They are to see wound care center on Tuesday and I will see them back again on Thursday and I discussed long-term fifth metatarsal head resection to prevent reoccurrence of this once heel

## 2014-07-09 ENCOUNTER — Encounter (HOSPITAL_BASED_OUTPATIENT_CLINIC_OR_DEPARTMENT_OTHER): Payer: Medicare Other | Attending: General Surgery

## 2014-07-09 DIAGNOSIS — L89609 Pressure ulcer of unspecified heel, unspecified stage: Secondary | ICD-10-CM | POA: Insufficient documentation

## 2014-07-09 DIAGNOSIS — L8990 Pressure ulcer of unspecified site, unspecified stage: Secondary | ICD-10-CM | POA: Diagnosis not present

## 2014-07-10 NOTE — H&P (Signed)
NAME:  Ralph Gray, GAGLIO NO.:  [id number]  MEDICAL RECORD NO.:  [id number]  LOCATION:  FOOT                         FACILITY:  [facility]  PHYSICIAN:  Elesa Hacker, M.D.        DATE OF BIRTH:  06/01/1935  DATE OF ADMISSION:  07/09/2014 DATE OF DISCHARGE:                             HISTORY & PHYSICAL   CHIEF COMPLAINT:  Wound, left plantar foot.  HISTORY OF PRESENT ILLNESS:  This is a 79 year old diabetic male, that has had a wound in a very similar location several years ago, was healed here after an HBO.  He has seen a podiatrist who took an x-ray of the foot and told me he had a bony deformity that will require an operation to fix but that the wound needed to be healed first.  He has been treated with offloading.  PAST MEDICAL HISTORY:  Type 2 diabetes, hypercholesterolemia, hypertension, atrial fibrillation, history of prostate cancer.  PAST SURGICAL HISTORY:  Coronary artery bypass graft.  SOCIAL HISTORY:  Cigarettes none.  Alcohol, occasionally.  MEDICATIONS:  Aspirin, Lipitor, Parlodel, isosorbide, and Lotrimin to the feet, Plendil, Flonase, __________, Glucophage, Singulair, Lyrica, Januvia, Flomax, and Coumadin.  ALLERGIES:  Amoxicillin, muscle relaxants, and sulfa.  REVIEW OF SYSTEMS:  As above.  PHYSICAL EXAMINATION:  VITAL SIGNS:  Temperature 98, pulse 54 and regular, respirations 18, blood pressure 139/62.  Glucose is 143. GENERAL APPEARANCE:  Well developed, well nourished, in no distress. CHEST:  Clear. HEART:  Regular rhythm at present. EXTREMITIES:  Examination of the left lower extremity reveals dorsalis pedis pulses palpable.  ABI is 1.0.  Underlying the 5th metatarsal head, there is a 1.0 x 0.9 x 0.3 wound which after debridement is 2.1 x 1.9 x 0.3.  In the midst of this wound, there is a great deal of abundant granulation tissue.  IMPRESSION:  Diabetic foot ulcer Wagner 2.  PLAN:  We have started debriding and treated the granulation  tissue with silver nitrate.  We will offload with stilt and healing sandal.  We will see him in 7 days.  We will consider easy casting.     Elesa Hacker, M.D.     RA/MEDQ  D:  07/09/2014  T:  07/09/2014  Job:  [id number]

## 2014-07-11 ENCOUNTER — Encounter: Payer: Self-pay | Admitting: Podiatry

## 2014-07-11 ENCOUNTER — Ambulatory Visit (INDEPENDENT_AMBULATORY_CARE_PROVIDER_SITE_OTHER): Payer: Medicare Other | Admitting: Podiatry

## 2014-07-11 VITALS — BP 158/66 | HR 62 | Temp 97.7°F | Resp 14

## 2014-07-11 DIAGNOSIS — I2510 Atherosclerotic heart disease of native coronary artery without angina pectoris: Secondary | ICD-10-CM

## 2014-07-11 DIAGNOSIS — L97522 Non-pressure chronic ulcer of other part of left foot with fat layer exposed: Secondary | ICD-10-CM

## 2014-07-11 NOTE — Progress Notes (Signed)
Subjective:     Patient ID: Ralph Gray, male   DOB: Nov 21, 1934, 79 y.o.   MRN: [id number]  HPI patient presents stating he went to the wound care center and they want to put a cast on his foot   Review of Systems     Objective:   Physical Exam Neurovascular status unchanged with a continued open wound plantar aspect fifth metatarsal head left it's not gotten better or worse over the last week.    Assessment:     Chronic open wound left that like to do fifth metatarsal head resection on eventually but first would like to heal the wound    Plan:     Reviewed condition with family and I have recommended they do go with cast therapy and the santyl topical medication. I'm due to reevaluate in 3 weeks

## 2014-07-11 NOTE — Progress Notes (Signed)
   Subjective:    Patient ID: Ralph Gray, male    DOB: June 26, 1935, 79 y.o.   MRN: [id number]  HPI Comments: Pt's wife states the [place] debrided the right 5th MPJ, changed the ointment to Santyl to be changed every other day, and recommended a cast to off load the ulcer site.     Review of Systems     Objective:   Physical Exam        Assessment & Plan:

## 2014-07-16 ENCOUNTER — Encounter (HOSPITAL_BASED_OUTPATIENT_CLINIC_OR_DEPARTMENT_OTHER): Payer: Medicare Other

## 2014-07-16 ENCOUNTER — Other Ambulatory Visit (HOSPITAL_BASED_OUTPATIENT_CLINIC_OR_DEPARTMENT_OTHER): Payer: Self-pay | Admitting: General Surgery

## 2014-07-16 ENCOUNTER — Encounter (HOSPITAL_BASED_OUTPATIENT_CLINIC_OR_DEPARTMENT_OTHER): Payer: Medicare Other | Attending: General Surgery

## 2014-07-16 DIAGNOSIS — M19072 Primary osteoarthritis, left ankle and foot: Secondary | ICD-10-CM

## 2014-07-16 DIAGNOSIS — L97421 Non-pressure chronic ulcer of left heel and midfoot limited to breakdown of skin: Secondary | ICD-10-CM | POA: Insufficient documentation

## 2014-07-16 DIAGNOSIS — T1490 Injury, unspecified: Secondary | ICD-10-CM | POA: Diagnosis not present

## 2014-07-16 DIAGNOSIS — E11621 Type 2 diabetes mellitus with foot ulcer: Secondary | ICD-10-CM | POA: Diagnosis not present

## 2014-07-19 ENCOUNTER — Ambulatory Visit (HOSPITAL_COMMUNITY)
Admission: RE | Admit: 2014-07-19 | Discharge: 2014-07-19 | Disposition: A | Discharge status: Home / Self Care | Payer: Medicare Other | Source: Ambulatory Visit | Attending: General Surgery | Admitting: General Surgery

## 2014-07-19 DIAGNOSIS — M79672 Pain in left foot: Secondary | ICD-10-CM | POA: Insufficient documentation

## 2014-07-19 DIAGNOSIS — M62572 Muscle wasting and atrophy, not elsewhere classified, left ankle and foot: Secondary | ICD-10-CM | POA: Diagnosis not present

## 2014-07-19 DIAGNOSIS — S91302D Unspecified open wound, left foot, subsequent encounter: Secondary | ICD-10-CM | POA: Insufficient documentation

## 2014-07-19 DIAGNOSIS — E118 Type 2 diabetes mellitus with unspecified complications: Secondary | ICD-10-CM | POA: Diagnosis not present

## 2014-07-19 DIAGNOSIS — M19072 Primary osteoarthritis, left ankle and foot: Secondary | ICD-10-CM

## 2014-07-19 DIAGNOSIS — M79673 Pain in unspecified foot: Secondary | ICD-10-CM | POA: Diagnosis present

## 2014-07-19 DIAGNOSIS — E119 Type 2 diabetes mellitus without complications: Secondary | ICD-10-CM | POA: Diagnosis not present

## 2014-07-19 LAB — POCT I-STAT CREATININE: Creatinine, Ser: 1.4 mg/dL — ABNORMAL HIGH (ref 0.50–1.35)

## 2014-07-19 MED ORDER — GADOBENATE DIMEGLUMINE 529 MG/ML IV SOLN
20.0000 mL | Freq: Once | INTRAVENOUS | Status: AC | PRN
  Administered 2014-07-19: 20 mL via INTRAVENOUS

## 2014-07-22 ENCOUNTER — Other Ambulatory Visit: Payer: Self-pay | Admitting: Endocrinology

## 2014-07-22 ENCOUNTER — Ambulatory Visit (INDEPENDENT_AMBULATORY_CARE_PROVIDER_SITE_OTHER): Payer: Medicare Other | Admitting: Endocrinology

## 2014-07-22 ENCOUNTER — Encounter: Payer: Self-pay | Admitting: Endocrinology

## 2014-07-22 VITALS — BP 116/58 | HR 73 | Temp 97.6°F | Ht 71.0 in | Wt 212.0 lb

## 2014-07-22 DIAGNOSIS — I2510 Atherosclerotic heart disease of native coronary artery without angina pectoris: Secondary | ICD-10-CM | POA: Diagnosis not present

## 2014-07-22 DIAGNOSIS — E1151 Type 2 diabetes mellitus with diabetic peripheral angiopathy without gangrene: Secondary | ICD-10-CM

## 2014-07-22 LAB — HEMOGLOBIN A1C
Hgb A1c MFr Bld: 6.7 % — ABNORMAL HIGH (ref <5.7)
Mean Plasma Glucose: 146 mg/dL — ABNORMAL HIGH (ref <117)

## 2014-07-22 NOTE — Patient Instructions (Addendum)
check your blood sugar once a day.  vary the time of day when you check, between before the 3 meals, and at bedtime.  also check if you have symptoms of your blood sugar being too high or too low.  please keep a record of the readings and bring it to your next appointment here.  please call us sooner if your blood sugar goes below 70, or if you have a lot of readings over 200.   A blood sugar test is being requested for you today.  We'll contact you with results.   If it is high, we can add "repaglinide." Please come back for a follow-up appointment in 3 months.

## 2014-07-22 NOTE — Progress Notes (Signed)
Subjective:    Patient ID: Ralph Gray, male    DOB: 1935-09-17, 79 y.o.   MRN: [id number]  HPI Pt returns for f/u of diabetes mellitus:  DM type: 2 Dx'ed: 9024 Complications: polyneuropathy, foot ulcer, CAD and PAD. Therapy: insulin since. DKA: never  Severe hypoglycemia: never Pancreatitis: never  Other: he has never been on insulin; he takes 4 oral meds; he can't take actos due to edema. Interval history:  Pt says he has been intermittently off DM meds, as he was told these were causing hematuria. He is back on now.  no cbg record, but states cbg's are in the mid-100's.  He has a few months of moderate ulcer at the left foot, but no assoc fever. Past Medical History  Diagnosis Date  . Hypertension   . Hyperlipidemia   . Peripheral neuropathy   . Macular degeneration BILATERAL    TX--- intra-orbital injections. HX  laser treatment  . Type II diabetes mellitus   . Bilateral carotid artery stenosis     PER DUPLEX 08-07-2013--  BILATERAL ICA  40-59% STENOSIS  . Anticoagulated on Coumadin   . History of prostate cancer     DX  2010--  S/P EXTERNAL RADIATION THERAPY  . Bladder tumor   . PAF (paroxysmal atrial fibrillation)     DX  2006  . First degree heart block   . Coronary artery disease CARDIOLOGIST --  DR Burt Knack    MI '99   S/P CABG  . History of MI (myocardial infarction)     1999  --  S/P CABG  . S/P CABG x 4     1999  IN [place]  . Cataract of both eyes     RESCHEDULED FOR EXTRACTIONS  MAY 2015  . Renal insufficiency   . Personal history of CLL (chronic lymphocytic leukemia)     PER LAB RESULTS IN 2010--  NO TX  ONLY BEING MONITORED  . History of hepatitis C virus infection     1990's  per lab results  --  asymptomatic ,  no tx  . Wears glasses   . At risk for sleep apnea     STOP-BANG=  5   //SENT TO PCP  02-06-2014  . Nocturia     Past Surgical History  Procedure Laterality Date  . Cardioversion  2008  . Cardiac catheterization  08-29-2012   DR  COOPER    POST CABG--  PATENT LIMA to  LAD,  free RIMA  to acute marginal branch of the RCA,  &  SVG to DIAGONAL/  TOTAL OCCLUSION OF SVG to distal RCA/  MODERATE SEGMENTAL LVSF/  80% STENOSIS  pCFX (which was not grafted)  . Transthoracic echocardiogram  08-01-2012  dr cooper    GRADE I DIASTOLIC DYSFUNCTION/  EF 55-60%/  MODERATE LAE/  AORTIC SCLEROSIS WITHOUT STENOSIS/  MILD TR  . Coronary artery bypass graft  1999    CABG X4;  in [place]  . Transurethral resection of bladder tumor N/A 02/08/2014    Procedure: TRANSURETHRAL RESECTION OF BLADDER TUMOR (TURBT) WITH GYRUS ;  Surgeon: [name], MD;  Location: [facility];  Service: Urology;  Laterality: N/A;    History   Social History  . Marital Status: Married    Spouse Name: N/A    Number of Children: N/A  . Years of Education: N/A   Occupational History  . Retired    Social History Main Topics  . Smoking status: Former Smoker --  2.50 packs/day for 9 years    Types: Cigarettes    Quit date: 10/14/1963  . Smokeless tobacco: Never Used  . Alcohol Use: No  . Drug Use: No  . Sexual Activity: Not on file   Other Topics Concern  . Not on file   Social History Narrative   HS[employer], [employer], [employer] - doctorate in education. Married '64 - 48yr/divorced ( severe bipolar disease); Married '07. 2 sons - '67, '66; 1 dtr - '70. 7 grandchildren. Exercise - 6 days a week: cardio and strengthening. 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 ACP- yes for CPR; no -long term ventilation; no heroic or futile measures.     Current Outpatient Prescriptions on File Prior to Visit  Medication Sig Dispense Refill  . aspirin 81 MG tablet Take 81 mg by mouth daily.        Marland Kitchen atorvastatin (LIPITOR) 10 MG tablet Take 1 tablet (10 mg total) by mouth every evening.  90 tablet  3  . bromocriptine (PARLODEL) 2.5 MG tablet TAKE 1 TABLET DAILY---  TAKES IN AM      . bromocriptine (PARLODEL) 2.5  MG tablet TAKE 1 TABLET DAILY  90 tablet  1  . cadexomer iodine (IODOSORB) 0.9 % gel Apply 1 application topically daily as needed for wound care.  40 g  1  . Canagliflozin (INVOKANA) 100 MG TABS Take 1 tablet (100 mg total) by mouth daily.  90 tablet  3  . clotrimazole (LOTRIMIN) 1 % cream Apply 1 application topically 3 (three) times daily. As needed for rash  60 g  3  . collagenase (SANTYL) ointment Apply 1 application topically every other day.      . felodipine (PLENDIL) 5 MG 24 hr tablet Take 5 mg by mouth every morning. TAKE 1 TABLET DAILY      . fluticasone (FLONASE) 50 MCG/ACT nasal spray USE 2 SPRAYS NASALLY DAILY  48 g  3  . L-Methylfolate-Algae-B12-B6 (METANX) 3-90.314-2-35 MG CAPS TAKE 1 CAPSULE DAILY  90 capsule  3  . losartan (COZAAR) 50 MG tablet Take 1 tablet (50 mg total) by mouth 2 (two) times daily.  180 tablet  3  . metFORMIN (GLUCOPHAGE) 1000 MG tablet 2 tabs po daily  180 tablet  1  . montelukast (SINGULAIR) 10 MG tablet Take 1 tablet (10 mg total) by mouth at bedtime.  90 tablet  3  . pregabalin (LYRICA) 75 MG capsule Take 1 capsule (75 mg total) by mouth 2 (two) times daily.  180 capsule  1  . sitaGLIPtin (JANUVIA) 100 MG tablet TAKE 1 TABLET DAILY  90 tablet  3  . sulfamethoxazole-trimethoprim (BACTRIM DS) 800-160 MG per tablet Take 1 tablet by mouth 2 (two) times daily.  20 tablet  1  . tamsulosin (FLOMAX) 0.4 MG CAPS capsule TAKE 1 CAPSULE TWICE A DAY  180 capsule  2  . warfarin (COUMADIN) 6 MG tablet TAKE AS DIRECTED BY COUMADIN CLINIC  130 tablet  1  . [DISCONTINUED] multivitamin (METANX) 3-35-2 MG TABS tablet Take 1 tablet by mouth daily.  90 tablet  1   No current facility-administered medications on file prior to visit.    Allergies  Allergen Reactions  . Amoxicillin Anaphylaxis  . Other Other (See Comments)    Muscle Relaxers; "I get very woozy"    Family History  Problem Relation Age of Onset  . Heart disease Mother 37    died  . Breast cancer Mother  sarcoma  . Heart attack Father 59    died  . Heart disease Father     MI  . Diabetes Neg Hx   . COPD Neg Hx     BP 116/58  Pulse 73  Temp(Src) 97.6 F (36.4 C) (Oral)  Ht 5\' 11"  (1.803 m)  Wt 212 lb (96.163 kg)  BMI 29.58 kg/m2  SpO2 95%  Review of Systems Denies weight change and n/v.      Objective:   Physical Exam VITAL SIGNS:  See vs page.   GENERAL: no distress.   EXTEMITIES: sees podiatry. 1+ bilat leg edema.  Left foot is in a bandage There is right foot onychomycosis.  Skin: There are bilat varicosities, and rust-colored skin, on the legs Pulses: dorsalis pedis intact on the right foot.   NEURO: sensation is intact to touch, but decreased from normal on the right foot.    Lab Results  Component Value Date   HGBA1C 7.0* 03/06/2014   i reviewed MRI with patient, and gave him a copy.    Assessment & Plan:  DM: mild exacerbation Foot ulcer, persistent.  i told pt glycemic control will help the healing of this.     Patient is advised the following: Patient Instructions  check your blood sugar once a day.  vary the time of day when you check, between before the 3 meals, and at bedtime.  also check if you have symptoms of your blood sugar being too high or too low.  please keep a record of the readings and bring it to your next appointment here.  please call us sooner if your blood sugar goes below 70, or if you have a lot of readings over 200.   A blood sugar test is being requested for you today.  We'll contact you with results.   If it is high, we can add "repaglinide." Please come back for a follow-up appointment in 3 months.

## 2014-07-23 DIAGNOSIS — E11621 Type 2 diabetes mellitus with foot ulcer: Secondary | ICD-10-CM | POA: Diagnosis not present

## 2014-07-23 DIAGNOSIS — L97421 Non-pressure chronic ulcer of left heel and midfoot limited to breakdown of skin: Secondary | ICD-10-CM | POA: Diagnosis not present

## 2014-07-23 LAB — LIPID PANEL
Cholesterol: 123 mg/dL (ref 0–200)
HDL: 40 mg/dL (ref >39)
LDL CALC: 54 mg/dL (ref 0–99)
Total CHOL/HDL Ratio: 3.1 Ratio
Triglycerides: 146 mg/dL (ref <150)
VLDL: 29 mg/dL (ref 0–40)

## 2014-07-23 LAB — BASIC METABOLIC PANEL
BUN: 26 mg/dL — ABNORMAL HIGH (ref 6–23)
CHLORIDE: 105 meq/L (ref 96–112)
CO2: 26 mEq/L (ref 19–32)
Calcium: 9.5 mg/dL (ref 8.4–10.5)
Creat: 1.36 mg/dL — ABNORMAL HIGH (ref 0.50–1.35)
Glucose, Bld: 132 mg/dL — ABNORMAL HIGH (ref 70–99)
POTASSIUM: 4.7 meq/L (ref 3.5–5.3)
Sodium: 139 mEq/L (ref 135–145)

## 2014-07-25 ENCOUNTER — Telehealth: Payer: Self-pay | Admitting: Endocrinology

## 2014-07-25 NOTE — Telephone Encounter (Signed)
Patient would like his lab results and would like to know if he needs an adjustment to his medication   Please advise    Thank you

## 2014-07-25 NOTE — Telephone Encounter (Signed)
Lvom advising pt that his cholesterol and blood sugar were both well controlled and to continue the same medications. Requested call back if pt wanted to discuss.

## 2014-07-30 DIAGNOSIS — L97421 Non-pressure chronic ulcer of left heel and midfoot limited to breakdown of skin: Secondary | ICD-10-CM | POA: Diagnosis not present

## 2014-07-30 DIAGNOSIS — E11621 Type 2 diabetes mellitus with foot ulcer: Secondary | ICD-10-CM | POA: Diagnosis not present

## 2014-07-31 ENCOUNTER — Telehealth: Payer: Self-pay | Admitting: Internal Medicine

## 2014-07-31 DIAGNOSIS — G629 Polyneuropathy, unspecified: Secondary | ICD-10-CM

## 2014-07-31 MED ORDER — PREGABALIN 75 MG PO CAPS: 75.0000 mg | ORAL_CAPSULE | Freq: Two times a day (BID) | ORAL | Status: DC

## 2014-07-31 MED ORDER — WARFARIN SODIUM 6 MG PO TABS: ORAL_TABLET | ORAL | Status: DC

## 2014-07-31 MED ORDER — FELODIPINE ER 5 MG PO TB24: 5.0000 mg | ORAL_TABLET | Freq: Every morning | ORAL | Status: DC

## 2014-07-31 MED ORDER — METANX 3-90.314-2-35 MG PO CAPS: ORAL_CAPSULE | ORAL | Status: DC

## 2014-07-31 NOTE — Telephone Encounter (Signed)
Patient is requesting 90 day supply of felodipine, lyrica,warfarin, and metanx [facility]

## 2014-07-31 NOTE — Telephone Encounter (Signed)
Notified pt [facility]...Ralph Gray

## 2014-07-31 NOTE — Telephone Encounter (Signed)
yes

## 2014-07-31 NOTE — Telephone Encounter (Signed)
Sent maintenence meds pls advise on lyrica...Ralph Gray

## 2014-08-01 ENCOUNTER — Ambulatory Visit (INDEPENDENT_AMBULATORY_CARE_PROVIDER_SITE_OTHER): Payer: Medicare Other | Admitting: Podiatry

## 2014-08-01 ENCOUNTER — Encounter: Payer: Self-pay | Admitting: Podiatry

## 2014-08-01 VITALS — BP 118/58 | HR 63 | Resp 16

## 2014-08-01 DIAGNOSIS — I2510 Atherosclerotic heart disease of native coronary artery without angina pectoris: Secondary | ICD-10-CM | POA: Diagnosis not present

## 2014-08-01 DIAGNOSIS — L97522 Non-pressure chronic ulcer of other part of left foot with fat layer exposed: Secondary | ICD-10-CM | POA: Diagnosis not present

## 2014-08-01 NOTE — Progress Notes (Signed)
Subjective:     Patient ID: Ralph Gray, male   DOB: 1935-03-16, 79 y.o.   MRN: [id number]  HPI patient presents stating it seems to be improving but it's not there yet. He is continuing to be nonweightbearing with padding around fifth metatarsal left and is currently utilized wound care matrix to help cure the area   Review of Systems     Objective:   Physical Exam Neurovascular status intact and unchanged with small lesion plantar left which is continuing to improve with minimal breakdown of skin and no erythema surrounding the area    Assessment:     Improved plantar ulceration left fifth metatarsal that is not quite completely healed    Plan:     Continue nonweightbearing and wound care products and we will see back in 4 weeks. The plan will be at one point did do a fifth metatarsal head resection when appropriate

## 2014-08-02 ENCOUNTER — Encounter: Payer: Self-pay | Admitting: Internal Medicine

## 2014-08-02 ENCOUNTER — Ambulatory Visit (INDEPENDENT_AMBULATORY_CARE_PROVIDER_SITE_OTHER): Payer: Medicare Other | Admitting: Internal Medicine

## 2014-08-02 VITALS — BP 108/64 | HR 68 | Temp 97.5°F | Resp 16 | Ht 71.0 in | Wt 210.0 lb

## 2014-08-02 DIAGNOSIS — N183 Chronic kidney disease, stage 3 unspecified: Secondary | ICD-10-CM

## 2014-08-02 DIAGNOSIS — Z23 Encounter for immunization: Secondary | ICD-10-CM | POA: Diagnosis not present

## 2014-08-02 DIAGNOSIS — Z0000 Encounter for general adult medical examination without abnormal findings: Secondary | ICD-10-CM

## 2014-08-02 DIAGNOSIS — E1151 Type 2 diabetes mellitus with diabetic peripheral angiopathy without gangrene: Secondary | ICD-10-CM

## 2014-08-02 DIAGNOSIS — I10 Essential (primary) hypertension: Secondary | ICD-10-CM

## 2014-08-02 NOTE — Progress Notes (Signed)
Pre visit review using our clinic review tool, if applicable. No additional management support is needed unless otherwise documented below in the visit note. 

## 2014-08-02 NOTE — Progress Notes (Signed)
   Subjective:    Patient ID: Ralph Gray, male    DOB: 05/04/1935, 79 y.o.   MRN: [id number]  Diabetes He presents for his follow-up diabetic visit. He has type 2 diabetes mellitus. His disease course has been stable. There are no hypoglycemic associated symptoms. Pertinent negatives for hypoglycemia include no dizziness, headaches or tremors. Pertinent negatives for diabetes include no blurred vision, no chest pain, no fatigue, no foot paresthesias, no foot ulcerations, no polydipsia, no polyphagia, no polyuria, no visual change, no weakness and no weight loss. There are no hypoglycemic complications. Diabetic complications include heart disease, nephropathy, peripheral neuropathy and PVD. Current diabetic treatment includes oral agent (triple therapy). He is compliant with treatment all of the time. His weight is stable. He is following a generally healthy diet. Meal planning includes avoidance of concentrated sweets. He participates in exercise intermittently. There is no change in his home blood glucose trend. An ACE inhibitor/angiotensin II receptor blocker is being taken. He sees a podiatrist.Eye exam is current.      Review of Systems  Constitutional: Negative.  Negative for fever, chills, weight loss, diaphoresis, appetite change and fatigue.  HENT: Negative.   Eyes: Negative.  Negative for blurred vision.  Respiratory: Negative.  Negative for cough, choking, chest tightness, shortness of breath and stridor.   Cardiovascular: Negative.  Negative for chest pain, palpitations and leg swelling.  Gastrointestinal: Negative.  Negative for nausea, vomiting, abdominal pain, diarrhea, constipation and blood in stool.  Endocrine: Negative.  Negative for polydipsia, polyphagia and polyuria.  Genitourinary: Negative.   Musculoskeletal: Negative.  Negative for arthralgias, back pain, gait problem, joint swelling, myalgias, neck pain and neck stiffness.  Skin: Negative.  Negative for rash.    Allergic/Immunologic: Negative.   Neurological: Negative.  Negative for dizziness, tremors, syncope, weakness, light-headedness, numbness and headaches.  Hematological: Negative.  Negative for adenopathy. Does not bruise/bleed easily.  Psychiatric/Behavioral: Negative.        Objective:   Physical Exam   Lab Results  Component Value Date   WBC 10.9* 02/08/2014   HGB 13.7 02/08/2014   HCT 40.1 02/08/2014   PLT 125* 02/08/2014   GLUCOSE 132* 07/22/2014   CHOL 123 07/22/2014   TRIG 146 07/22/2014   HDL 40 07/22/2014   LDLDIRECT 59.4 07/11/2013   LDLCALC 54 07/22/2014   ALT 39 01/03/2014   AST 29 01/03/2014   NA 139 07/22/2014   K 4.7 07/22/2014   CL 105 07/22/2014   CREATININE 1.36* 07/22/2014   BUN 26* 07/22/2014   CO2 26 07/22/2014   PSA 0.06 01/07/2014   INR 2.3 06/26/2014   HGBA1C 6.7* 07/22/2014   MICROALBUR 31.6* 07/30/2013       Assessment & Plan:

## 2014-08-02 NOTE — Patient Instructions (Signed)
Health Maintenance A healthy lifestyle and preventative care can promote health and wellness.  Maintain regular health, dental, and eye exams.  Eat a healthy diet. Foods like vegetables, fruits, whole grains, low-fat dairy products, and lean protein foods contain the nutrients you need and are low in calories. Decrease your intake of foods high in solid fats, added sugars, and salt. Get information about a proper diet from your health care provider, if necessary.  Regular physical exercise is one of the most important things you can do for your health. Most adults should get at least 150 minutes of moderate-intensity exercise (any activity that increases your heart rate and causes you to sweat) each week. In addition, most adults need muscle-strengthening exercises on 2 or more days a week.   Maintain a healthy weight. The body mass index (BMI) is a screening tool to identify possible weight problems. It provides an estimate of body fat based on height and weight. Your health care provider can find your BMI and can help you achieve or maintain a healthy weight. For males 20 years and older:  A BMI below 18.5 is considered underweight.  A BMI of 18.5 to 24.9 is normal.  A BMI of 25 to 29.9 is considered overweight.  A BMI of 30 and above is considered obese.  Maintain normal blood lipids and cholesterol by exercising and minimizing your intake of saturated fat. Eat a balanced diet with plenty of fruits and vegetables. Blood tests for lipids and cholesterol should begin at age 20 and be repeated every 5 years. If your lipid or cholesterol levels are high, you are over age 50, or you are at high risk for heart disease, you may need your cholesterol levels checked more frequently.Ongoing high lipid and cholesterol levels should be treated with medicines if diet and exercise are not working.  If you smoke, find out from your health care provider how to quit. If you do not use tobacco, do not  start.  Lung cancer screening is recommended for adults aged 55-80 years who are at high risk for developing lung cancer because of a history of smoking. A yearly low-dose CT scan of the lungs is recommended for people who have at least a 30-pack-year history of smoking and are current smokers or have quit within the past 15 years. A pack year of smoking is smoking an average of 1 pack of cigarettes a day for 1 year (for example, a 30-pack-year history of smoking could mean smoking 1 pack a day for 30 years or 2 packs a day for 15 years). Yearly screening should continue until the smoker has stopped smoking for at least 15 years. Yearly screening should be stopped for people who develop a health problem that would prevent them from having lung cancer treatment.  If you choose to drink alcohol, do not have more than 2 drinks per day. One drink is considered to be 12 oz (360 mL) of beer, 5 oz (150 mL) of wine, or 1.5 oz (45 mL) of liquor.  Avoid the use of street drugs. Do not share needles with anyone. Ask for help if you need support or instructions about stopping the use of drugs.  High blood pressure causes heart disease and increases the risk of stroke. Blood pressure should be checked at least every 1-2 years. Ongoing high blood pressure should be treated with medicines if weight loss and exercise are not effective.  If you are 45-79 years old, ask your health care provider if   you should take aspirin to prevent heart disease.  Diabetes screening involves taking a blood sample to check your fasting blood sugar level. This should be done once every 3 years after age 45 if you are at a normal weight and without risk factors for diabetes. Testing should be considered at a younger age or be carried out more frequently if you are overweight and have at least 1 risk factor for diabetes.  Colorectal cancer can be detected and often prevented. Most routine colorectal cancer screening begins at the age of 50  and continues through age 75. However, your health care provider may recommend screening at an earlier age if you have risk factors for colon cancer. On a yearly basis, your health care provider may provide home test kits to check for hidden blood in the stool. A small camera at the end of a tube may be used to directly examine the colon (sigmoidoscopy or colonoscopy) to detect the earliest forms of colorectal cancer. Talk to your health care provider about this at age 50 when routine screening begins. A direct exam of the colon should be repeated every 5-10 years through age 75, unless early forms of precancerous polyps or small growths are found.  People who are at an increased risk for hepatitis B should be screened for this virus. You are considered at high risk for hepatitis B if:  You were born in a country where hepatitis B occurs often. Talk with your health care provider about which countries are considered high risk.  Your parents were born in a high-risk country and you have not received a shot to protect against hepatitis B (hepatitis B vaccine).  You have HIV or AIDS.  You use needles to inject street drugs.  You live with, or have sex with, someone who has hepatitis B.  You are a man who has sex with other men (MSM).  You get hemodialysis treatment.  You take certain medicines for conditions like cancer, organ transplantation, and autoimmune conditions.  Hepatitis C blood testing is recommended for all people born from 1945 through 1965 and any individual with known risk factors for hepatitis C.  Healthy men should no longer receive prostate-specific antigen (PSA) blood tests as part of routine cancer screening. Talk to your health care provider about prostate cancer screening.  Testicular cancer screening is not recommended for adolescents or adult males who have no symptoms. Screening includes self-exam, a health care provider exam, and other screening tests. Consult with your  health care provider about any symptoms you have or any concerns you have about testicular cancer.  Practice safe sex. Use condoms and avoid high-risk sexual practices to reduce the spread of sexually transmitted infections (STIs).  You should be screened for STIs, including gonorrhea and chlamydia if:  You are sexually active and are younger than 24 years.  You are older than 24 years, and your health care provider tells you that you are at risk for this type of infection.  Your sexual activity has changed since you were last screened, and you are at an increased risk for chlamydia or gonorrhea. Ask your health care provider if you are at risk.  If you are at risk of being infected with HIV, it is recommended that you take a prescription medicine daily to prevent HIV infection. This is called pre-exposure prophylaxis (PrEP). You are considered at risk if:  You are a man who has sex with other men (MSM).  You are a heterosexual man who   is sexually active with multiple partners.  You take drugs by injection.  You are sexually active with a partner who has HIV.  Talk with your health care provider about whether you are at high risk of being infected with HIV. If you choose to begin PrEP, you should first be tested for HIV. You should then be tested every 3 months for as long as you are taking PrEP.  Use sunscreen. Apply sunscreen liberally and repeatedly throughout the day. You should seek shade when your shadow is shorter than you. Protect yourself by wearing long sleeves, pants, a wide-brimmed hat, and sunglasses year round whenever you are outdoors.  Tell your health care provider of new moles or changes in moles, especially if there is a change in shape or color. Also, tell your health care provider if a mole is larger than the size of a pencil eraser.  A one-time screening for abdominal aortic aneurysm (AAA) and surgical repair of large AAAs by ultrasound is recommended for men aged  65-75 years who are current or former smokers.  Stay current with your vaccines (immunizations). Document Released: 03/25/2008 Document Revised: 10/02/2013 Document Reviewed: 02/22/2011 Exit[employer] Patient Information 2015 [employer]. This information is not intended to replace advice given to you by your health care provider. Make sure you discuss any questions you have with your health care provider. Type 2 Diabetes Mellitus Type 2 diabetes mellitus, often simply referred to as type 2 diabetes, is a long-lasting (chronic) disease. In type 2 diabetes, the pancreas does not make enough insulin (a hormone), the cells are less responsive to the insulin that is made (insulin resistance), or both. Normally, insulin moves sugars from food into the tissue cells. The tissue cells use the sugars for energy. The lack of insulin or the lack of normal response to insulin causes excess sugars to build up in the blood instead of going into the tissue cells. As a result, high blood sugar (hyperglycemia) develops. The effect of high sugar (glucose) levels can cause many complications. Type 2 diabetes was also previously called adult-onset diabetes, but it can occur at any age.  RISK FACTORS  A person is predisposed to developing type 2 diabetes if someone in the family has the disease and also has one or more of the following primary risk factors:  Overweight.  An inactive lifestyle.  A history of consistently eating high-calorie foods. Maintaining a normal weight and regular physical activity can reduce the chance of developing type 2 diabetes. SYMPTOMS  A person with type 2 diabetes may not show symptoms initially. The symptoms of type 2 diabetes appear slowly. The symptoms include:  Increased thirst (polydipsia).  Increased urination (polyuria).  Increased urination during the night (nocturia).  Weight loss. This weight loss may be rapid.  Frequent, recurring infections.  Tiredness  (fatigue).  Weakness.  Vision changes, such as blurred vision.  Fruity smell to your breath.  Abdominal pain.  Nausea or vomiting.  Cuts or bruises which are slow to heal.  Tingling or numbness in the hands or feet. DIAGNOSIS Type 2 diabetes is frequently not diagnosed until complications of diabetes are present. Type 2 diabetes is diagnosed when symptoms or complications are present and when blood glucose levels are increased. Your blood glucose level may be checked by one or more of the following blood tests:  A fasting blood glucose test. You will not be allowed to eat for at least 8 hours before a blood sample is taken.  A random blood   glucose test. Your blood glucose is checked at any time of the day regardless of when you ate.  A hemoglobin A1c blood glucose test. A hemoglobin A1c test provides information about blood glucose control over the previous 3 months.  An oral glucose tolerance test (OGTT). Your blood glucose is measured after you have not eaten (fasted) for 2 hours and then after you drink a glucose-containing beverage. TREATMENT   You may need to take insulin or diabetes medicine daily to keep blood glucose levels in the desired range.  If you use insulin, you may need to adjust the dosage depending on the carbohydrates that you eat with each meal or snack. The treatment goal is to maintain the before meal blood sugar (preprandial glucose) level at 70-130 mg/dL. HOME CARE INSTRUCTIONS   Have your hemoglobin A1c level checked twice a year.  Perform daily blood glucose monitoring as directed by your health care provider.  Monitor urine ketones when you are ill and as directed by your health care provider.  Take your diabetes medicine or insulin as directed by your health care provider to maintain your blood glucose levels in the desired range.  Never run out of diabetes medicine or insulin. It is needed every day.  If you are using insulin, you may need to  adjust the amount of insulin given based on your intake of carbohydrates. Carbohydrates can raise blood glucose levels but need to be included in your diet. Carbohydrates provide vitamins, minerals, and fiber which are an essential part of a healthy diet. Carbohydrates are found in fruits, vegetables, whole grains, dairy products, legumes, and foods containing added sugars.  Eat healthy foods. You should make an appointment to see a registered dietitian to help you create an eating plan that is right for you.  Lose weight if you are overweight.  Carry a medical alert card or wear your medical alert jewelry.  Carry a 15-gram carbohydrate snack with you at all times to treat low blood glucose (hypoglycemia). Some examples of 15-gram carbohydrate snacks include:  Glucose tablets, 3 or 4.  Glucose gel, 15-gram tube.  Raisins, 2 tablespoons (24 grams).  Jelly beans, 6.  Animal crackers, 8.  Regular pop, 4 ounces (120 mL).  Gummy treats, 9.  Recognize hypoglycemia. Hypoglycemia occurs with blood glucose levels of 70 mg/dL and below. The risk for hypoglycemia increases when fasting or skipping meals, during or after intense exercise, and during sleep. Hypoglycemia symptoms can include:  Tremors or shakes.  Decreased ability to concentrate.  Sweating.  Increased heart rate.  Headache.  Dry mouth.  Hunger.  Irritability.  Anxiety.  Restless sleep.  Altered speech or coordination.  Confusion.  Treat hypoglycemia promptly. If you are alert and able to safely swallow, follow the 15:15 rule:  Take 15-20 grams of rapid-acting glucose or carbohydrate. Rapid-acting options include glucose gel, glucose tablets, or 4 ounces (120 mL) of fruit juice, regular soda, or low-fat milk.  Check your blood glucose level 15 minutes after taking the glucose.  Take 15-20 grams more of glucose if the repeat blood glucose level is still 70 mg/dL or below.  Eat a meal or snack within 1 hour  once blood glucose levels return to normal.  Be alert to feeling very thirsty and urinating more frequently than usual, which are early signs of hyperglycemia. An early awareness of hyperglycemia allows for prompt treatment. Treat hyperglycemia as directed by your health care provider.  Engage in at least 150 minutes of moderate-intensity physical activity   a week, spread over at least 3 days of the week or as directed by your health care provider. In addition, you should engage in resistance exercise at least 2 times a week or as directed by your health care provider. Try to spend no more than 90 minutes at one time inactive.  Adjust your medicine and food intake as needed if you start a new exercise or sport.  Follow your sick-day plan anytime you are unable to eat or drink as usual.  Do not use any tobacco products including cigarettes, chewing tobacco, or electronic cigarettes. If you need help quitting, ask your health care provider.  Limit alcohol intake to no more than 1 drink per day for nonpregnant women and 2 drinks per day for men. You should drink alcohol only when you are also eating food. Talk with your health care provider whether alcohol is safe for you. Tell your health care provider if you drink alcohol several times a week.  Keep all follow-up visits as directed by your health care provider. This is important.  Schedule an eye exam soon after the diagnosis of type 2 diabetes and then annually.  Perform daily skin and foot care. Examine your skin and feet daily for cuts, bruises, redness, nail problems, bleeding, blisters, or sores. A foot exam by a health care provider should be done annually.  Brush your teeth and gums at least twice a day and floss at least once a day. Follow up with your dentist regularly.  Share your diabetes management plan with your workplace or school.  Stay up-to-date with immunizations. It is recommended that people with diabetes who are over 65  years old get the pneumonia vaccine. In some cases, two separate shots may be given. Ask your health care provider if your pneumonia vaccination is up-to-date.  Learn to manage stress.  Obtain ongoing diabetes education and support as needed.  Participate in or seek rehabilitation as needed to maintain or improve independence and quality of life. Request a physical or occupational therapy referral if you are having foot or hand numbness, or difficulties with grooming, dressing, eating, or physical activity. SEEK MEDICAL CARE IF:   You are unable to eat food or drink fluids for more than 6 hours.  You have nausea and vomiting for more than 6 hours.  Your blood glucose level is over 240 mg/dL.  There is a change in mental status.  You develop an additional serious illness.  You have diarrhea for more than 6 hours.  You have been sick or have had a fever for a couple of days and are not getting better.  You have pain during any physical activity.  SEEK IMMEDIATE MEDICAL CARE IF:  You have difficulty breathing.  You have moderate to large ketone levels. MAKE SURE YOU:  Understand these instructions.  Will watch your condition.  Will get help right away if you are not doing well or get worse. Document Released: 09/27/2005 Document Revised: 02/11/2014 Document Reviewed: 04/25/2012 Exit[employer] Patient Information 2015 [employer]. This information is not intended to replace advice given to you by your health care provider. Make sure you discuss any questions you have with your health care provider.  

## 2014-08-04 ENCOUNTER — Encounter: Payer: Self-pay | Admitting: Internal Medicine

## 2014-08-04 NOTE — Assessment & Plan Note (Signed)

## 2014-08-04 NOTE — Assessment & Plan Note (Signed)
His BP is well controlled Lytes and renal function are stable 

## 2014-08-04 NOTE — Assessment & Plan Note (Signed)
His renal function is stable 

## 2014-08-04 NOTE — Assessment & Plan Note (Signed)
His blood sugars are well controlled 

## 2014-08-06 DIAGNOSIS — E11621 Type 2 diabetes mellitus with foot ulcer: Secondary | ICD-10-CM | POA: Diagnosis not present

## 2014-08-06 DIAGNOSIS — L97421 Non-pressure chronic ulcer of left heel and midfoot limited to breakdown of skin: Secondary | ICD-10-CM | POA: Diagnosis not present

## 2014-08-08 ENCOUNTER — Ambulatory Visit (INDEPENDENT_AMBULATORY_CARE_PROVIDER_SITE_OTHER): Payer: Medicare Other | Admitting: *Deleted

## 2014-08-08 ENCOUNTER — Ambulatory Visit: Payer: Medicare Other | Admitting: Podiatry

## 2014-08-08 DIAGNOSIS — Z7901 Long term (current) use of anticoagulants: Secondary | ICD-10-CM

## 2014-08-08 DIAGNOSIS — Z5181 Encounter for therapeutic drug level monitoring: Secondary | ICD-10-CM

## 2014-08-08 LAB — POCT INR: INR: 2.7

## 2014-08-13 ENCOUNTER — Encounter (HOSPITAL_BASED_OUTPATIENT_CLINIC_OR_DEPARTMENT_OTHER): Payer: Medicare Other | Attending: General Surgery

## 2014-08-13 DIAGNOSIS — E11621 Type 2 diabetes mellitus with foot ulcer: Secondary | ICD-10-CM | POA: Diagnosis not present

## 2014-08-13 DIAGNOSIS — L97529 Non-pressure chronic ulcer of other part of left foot with unspecified severity: Secondary | ICD-10-CM | POA: Diagnosis not present

## 2014-08-17 ENCOUNTER — Other Ambulatory Visit: Payer: Self-pay | Admitting: Internal Medicine

## 2014-08-19 ENCOUNTER — Ambulatory Visit (HOSPITAL_COMMUNITY): Payer: Medicare Other | Attending: Cardiovascular Disease | Admitting: Cardiology

## 2014-08-19 DIAGNOSIS — E119 Type 2 diabetes mellitus without complications: Secondary | ICD-10-CM | POA: Diagnosis not present

## 2014-08-19 DIAGNOSIS — E785 Hyperlipidemia, unspecified: Secondary | ICD-10-CM | POA: Insufficient documentation

## 2014-08-19 DIAGNOSIS — Z951 Presence of aortocoronary bypass graft: Secondary | ICD-10-CM | POA: Insufficient documentation

## 2014-08-19 DIAGNOSIS — I10 Essential (primary) hypertension: Secondary | ICD-10-CM | POA: Insufficient documentation

## 2014-08-19 DIAGNOSIS — I6529 Occlusion and stenosis of unspecified carotid artery: Secondary | ICD-10-CM

## 2014-08-19 DIAGNOSIS — I6523 Occlusion and stenosis of bilateral carotid arteries: Secondary | ICD-10-CM

## 2014-08-19 DIAGNOSIS — Z87891 Personal history of nicotine dependence: Secondary | ICD-10-CM | POA: Diagnosis not present

## 2014-08-19 DIAGNOSIS — I2510 Atherosclerotic heart disease of native coronary artery without angina pectoris: Secondary | ICD-10-CM | POA: Insufficient documentation

## 2014-08-19 NOTE — Progress Notes (Signed)
Carotid duplex performed 

## 2014-08-20 DIAGNOSIS — E11621 Type 2 diabetes mellitus with foot ulcer: Secondary | ICD-10-CM | POA: Diagnosis not present

## 2014-08-20 DIAGNOSIS — L97529 Non-pressure chronic ulcer of other part of left foot with unspecified severity: Secondary | ICD-10-CM | POA: Diagnosis not present

## 2014-08-26 ENCOUNTER — Ambulatory Visit (INDEPENDENT_AMBULATORY_CARE_PROVIDER_SITE_OTHER): Payer: Medicare Other | Admitting: Cardiovascular Disease

## 2014-08-26 ENCOUNTER — Encounter: Payer: Self-pay | Admitting: Cardiovascular Disease

## 2014-08-26 VITALS — BP 100/62 | HR 57 | Ht 71.0 in | Wt 215.0 lb

## 2014-08-26 DIAGNOSIS — I10 Essential (primary) hypertension: Secondary | ICD-10-CM

## 2014-08-26 DIAGNOSIS — E785 Hyperlipidemia, unspecified: Secondary | ICD-10-CM | POA: Diagnosis not present

## 2014-08-26 DIAGNOSIS — I2510 Atherosclerotic heart disease of native coronary artery without angina pectoris: Secondary | ICD-10-CM | POA: Diagnosis not present

## 2014-08-26 NOTE — Progress Notes (Signed)
Background: The patient has coronary artery disease status post CABG. He's also followed for atrial fibrillation, hypertension, carotid stenosis, and hyperlipidemia. Cardiac catheterization in 2013 demonstrated continued patency of the LIMA to LAD, free RIMA to acute marginal branch of the RCA, and saphenous vein graft to diagonal. The saphenous vein graft to distal RCA was occluded and there was moderately severe stenosis of the native left circumflex. Medical therapy was recommended.  HPI:  79 year old gentleman presenting for follow-up evaluation. The patient has no cardiac related complaints today. He specifically denies chest pain, chest pressure, shortness of breath, edema, heart palpitations, orthopnea, PND, lightheadedness, or syncope.  Since I have last seen him, his had continued problems with a nonhealing diabetic foot ulcer. He is undergoing treatments at the wound care center. He is also followed by Dr. Paulla Dolly with podiatry. He is not able to do as much exercise because of limitation related to his foot. The patient is still in a soft boot on the left. He also has been diagnosed with bladder cancer and he is undergoing cystoscopes every 3 months.  Studies:  Carotid duplex scan 08/19/2014: 40-59% bilateral ICA stenosis. 1 year follow-up recommended.  Lipids 07/22/2014: Cholesterol 123, triglycerides 146, HDL 40, LDL 54  Hemoglobin A1c 6.7, creatinine 1.36  Outpatient Encounter Prescriptions as of 08/26/2014  Medication Sig  . aspirin 81 MG tablet Take 81 mg by mouth daily.    Marland Kitchen atorvastatin (LIPITOR) 10 MG tablet Take 1 tablet (10 mg total) by mouth every evening.  . bromocriptine (PARLODEL) 2.5 MG tablet TAKE 1 TABLET DAILY---  TAKES IN AM  . ciprofloxacin (CIPRO) 750 MG tablet   . clotrimazole (LOTRIMIN) 1 % cream   . felodipine (PLENDIL) 5 MG 24 hr tablet Take 1 tablet (5 mg total) by mouth every morning. TAKE 1 TABLET DAILY  . fluticasone (FLONASE) 50 MCG/ACT nasal spray USE  2 SPRAYS NASALLY DAILY  . INVOKANA 100 MG TABS tablet   . IODOSORB 0.9 % gel   . L-Methylfolate-Algae-B12-B6 (METANX) 3-90.314-2-35 MG CAPS TAKE 1 CAPSULE DAILY  . losartan (COZAAR) 50 MG tablet Take 1 tablet (50 mg total) by mouth 2 (two) times daily.  . metFORMIN (GLUCOPHAGE) 1000 MG tablet TAKE 2 TABLETS DAILY  . montelukast (SINGULAIR) 10 MG tablet Take 1 tablet (10 mg total) by mouth at bedtime.  . mupirocin ointment (BACTROBAN) 2 %   . pregabalin (LYRICA) 75 MG capsule Take 1 capsule (75 mg total) by mouth 2 (two) times daily.  Marland Kitchen SANTYL ointment   . sitaGLIPtin (JANUVIA) 100 MG tablet TAKE 1 TABLET DAILY  . sulfamethoxazole-trimethoprim (BACTRIM DS) 800-160 MG per tablet   . tamsulosin (FLOMAX) 0.4 MG CAPS capsule TAKE 1 CAPSULE TWICE A DAY  . warfarin (COUMADIN) 6 MG tablet TAKE AS DIRECTED BY COUMADIN CLINIC  . [DISCONTINUED] bromocriptine (PARLODEL) 2.5 MG tablet TAKE 1 TABLET DAILY    Allergies  Allergen Reactions  . Amoxicillin Anaphylaxis  . Other Other (See Comments)    Muscle Relaxers; "I get very woozy"    Past Medical History  Diagnosis Date  . Hypertension   . Hyperlipidemia   . Peripheral neuropathy   . Macular degeneration BILATERAL    TX--- intra-orbital injections. HX  laser treatment  . Type II diabetes mellitus   . Bilateral carotid artery stenosis     PER DUPLEX 08-07-2013--  BILATERAL ICA  40-59% STENOSIS  . Anticoagulated on Coumadin   . History of prostate cancer     DX  2010--  S/P EXTERNAL RADIATION THERAPY  . Bladder tumor   . PAF (paroxysmal atrial fibrillation)     DX  2006  . First degree heart block   . Coronary artery disease CARDIOLOGIST --  DR Burt Knack    MI '99   S/P CABG  . History of MI (myocardial infarction)     1999  --  S/P CABG  . S/P CABG x 4     1999  IN [place]  . Cataract of both eyes     RESCHEDULED FOR EXTRACTIONS  MAY 2015  . Renal insufficiency   . Personal history of CLL (chronic lymphocytic leukemia)     PER  LAB RESULTS IN 2010--  NO TX  ONLY BEING MONITORED  . History of hepatitis C virus infection     1990's  per lab results  --  asymptomatic ,  no tx  . Wears glasses   . At risk for sleep apnea     STOP-BANG=  5   //SENT TO PCP  02-06-2014  . Nocturia     family history includes Breast cancer in his mother; Heart attack (age of onset: 49) in his father; Heart disease in his father; Heart disease (age of onset: 47) in his mother. There is no history of Diabetes or COPD.   ROS: Negative except as per HPI  BP 100/62 mmHg  Pulse 57  Ht 5\' 11"  (1.803 m)  Wt 215 lb (97.523 kg)  BMI 30.00 kg/m2  PHYSICAL EXAM: Pt is alert and oriented, NAD HEENT: normal Neck: JVP - normal, carotids 2+= without bruits Lungs: CTA bilaterally CV: RRR without murmur or gallop Abd: soft, NT, Positive BS, no hepatomegaly Ext: no C/C/E, distal pulses intact and equal Skin: warm/dry, chronic skin changes both lower legs  EKG:  Normal sinus rhythm 57 bpm. left anterior fascicular block.  ASSESSMENT AND PLAN: 1. Coronary artery disease, native vessel and autologous vein graft disease, without angina. He continues on a combination of aspirin and atorvastatin. His beta blocker has been stopped because of bradycardia.  2. Essential hypertension. Blood pressure is under ideal control on a combination of felodipine and losartan.  3. Paroxysmal atrial fibrillation, maintaining sinus rhythm. Anticoagulated with warfarin. No bleeding problems reported.   4. Carotid stenosis without history of stroke. Recent Doppler study showed 40-59% bilateral ICA stenosis. 1 year follow-up scan recommended. On appropriate risk reduction  5. Hyperlipidemia. Recent lipids reviewed with LDL at goal on atorvastatin.  I will see him back in 6 months for follow-up evaluation.  [name], MD 08/26/2014 8:33 AM

## 2014-08-26 NOTE — Patient Instructions (Addendum)
Your physician wants you to follow-up in: 6 MONTHS with Dr Cooper.  You will receive a reminder letter in the mail two months in advance. If you don't receive a letter, please call our office to schedule the follow-up appointment.  Your physician recommends that you continue on your current medications as directed. Please refer to the Current Medication list given to you today.  

## 2014-08-27 DIAGNOSIS — L97529 Non-pressure chronic ulcer of other part of left foot with unspecified severity: Secondary | ICD-10-CM | POA: Diagnosis not present

## 2014-08-27 DIAGNOSIS — E11621 Type 2 diabetes mellitus with foot ulcer: Secondary | ICD-10-CM | POA: Diagnosis not present

## 2014-08-29 ENCOUNTER — Encounter: Payer: Self-pay | Admitting: Podiatry

## 2014-08-29 ENCOUNTER — Ambulatory Visit (INDEPENDENT_AMBULATORY_CARE_PROVIDER_SITE_OTHER): Payer: Medicare Other | Admitting: Podiatry

## 2014-08-29 VITALS — BP 133/65 | HR 70 | Resp 16

## 2014-08-29 DIAGNOSIS — I2510 Atherosclerotic heart disease of native coronary artery without angina pectoris: Secondary | ICD-10-CM | POA: Diagnosis not present

## 2014-08-29 DIAGNOSIS — B351 Tinea unguium: Secondary | ICD-10-CM

## 2014-08-29 DIAGNOSIS — Q667 Congenital pes cavus: Secondary | ICD-10-CM | POA: Diagnosis not present

## 2014-08-29 DIAGNOSIS — M216X9 Other acquired deformities of unspecified foot: Secondary | ICD-10-CM

## 2014-08-29 DIAGNOSIS — E1151 Type 2 diabetes mellitus with diabetic peripheral angiopathy without gangrene: Secondary | ICD-10-CM | POA: Diagnosis not present

## 2014-08-29 DIAGNOSIS — L97522 Non-pressure chronic ulcer of other part of left foot with fat layer exposed: Secondary | ICD-10-CM | POA: Diagnosis not present

## 2014-08-29 DIAGNOSIS — M79673 Pain in unspecified foot: Secondary | ICD-10-CM

## 2014-08-29 NOTE — Progress Notes (Signed)
Subjective:     Patient ID: Ralph Gray, male   DOB: 1935/09/11, 79 y.o.   MRN: [id number]  HPI patient presents with wife stating the area is almost entirely healed and I'm ready for surgery but I do have several trips planned so we'll probably defer until January   Review of Systems     Objective:   Physical Exam Neurovascular status unchanged with good digital perfusion and noted to have a healing site fifth metatarsal head left with a small amount of callus tissue with no drainage redness or edema noted    Assessment:     Improved chronic ulceration which he's had 2 bouts with plantar left by utilizing immobilization and wound care    Plan:     Discussed condition and recommended at this time fifth metatarsal head resection left in order to prevent these chronic recurrences of ulceration and the fact that he cannot do any form of physical activity and that he is gaining weight. I explained surgery and allowed him to read consent form for metatarsal head resection going over all risk of the procedure. Patient wants surgery understanding risk and will have done first week of January and we have ordered a vascular study first to confirm good circulatory flow for healing. He understands all risk and signs consent form currently and is scheduled for surgery

## 2014-09-09 ENCOUNTER — Telehealth: Payer: Self-pay | Admitting: *Deleted

## 2014-09-09 NOTE — Telephone Encounter (Signed)
Pt states he has not received a call from the circulation center on [address], and he needs the testing prior to scheduling the procedure with Dr. Paulla Dolly.

## 2014-09-10 ENCOUNTER — Telehealth (HOSPITAL_COMMUNITY): Payer: Self-pay | Admitting: *Deleted

## 2014-09-10 ENCOUNTER — Encounter (HOSPITAL_BASED_OUTPATIENT_CLINIC_OR_DEPARTMENT_OTHER): Payer: Medicare Other | Attending: General Surgery

## 2014-09-10 DIAGNOSIS — Q667 Congenital pes cavus: Secondary | ICD-10-CM | POA: Diagnosis not present

## 2014-09-10 DIAGNOSIS — B351 Tinea unguium: Secondary | ICD-10-CM | POA: Diagnosis not present

## 2014-09-10 DIAGNOSIS — M79673 Pain in unspecified foot: Secondary | ICD-10-CM | POA: Diagnosis not present

## 2014-09-10 DIAGNOSIS — E1151 Type 2 diabetes mellitus with diabetic peripheral angiopathy without gangrene: Secondary | ICD-10-CM | POA: Insufficient documentation

## 2014-09-10 DIAGNOSIS — L97522 Non-pressure chronic ulcer of other part of left foot with fat layer exposed: Secondary | ICD-10-CM | POA: Diagnosis not present

## 2014-09-10 DIAGNOSIS — C679 Malignant neoplasm of bladder, unspecified: Secondary | ICD-10-CM | POA: Diagnosis not present

## 2014-09-12 ENCOUNTER — Ambulatory Visit (HOSPITAL_COMMUNITY)
Admission: RE | Admit: 2014-09-12 | Discharge: 2014-09-12 | Disposition: A | Discharge status: Home / Self Care | Payer: Medicare Other | Source: Ambulatory Visit | Attending: Cardiovascular Disease | Admitting: Cardiovascular Disease

## 2014-09-12 DIAGNOSIS — L97522 Non-pressure chronic ulcer of other part of left foot with fat layer exposed: Secondary | ICD-10-CM | POA: Insufficient documentation

## 2014-09-12 DIAGNOSIS — E119 Type 2 diabetes mellitus without complications: Secondary | ICD-10-CM | POA: Diagnosis not present

## 2014-09-12 DIAGNOSIS — I10 Essential (primary) hypertension: Secondary | ICD-10-CM | POA: Insufficient documentation

## 2014-09-12 NOTE — Progress Notes (Signed)
Lower Extremity Arterial Duplex Completed. °Brianna L Mazza,RVT °

## 2014-09-13 ENCOUNTER — Ambulatory Visit (INDEPENDENT_AMBULATORY_CARE_PROVIDER_SITE_OTHER): Payer: Medicare Other | Admitting: Sports Medicine

## 2014-09-13 DIAGNOSIS — S76311A Strain of muscle, fascia and tendon of the posterior muscle group at thigh level, right thigh, initial encounter: Secondary | ICD-10-CM | POA: Diagnosis not present

## 2014-09-13 DIAGNOSIS — I2510 Atherosclerotic heart disease of native coronary artery without angina pectoris: Secondary | ICD-10-CM

## 2014-09-13 NOTE — Progress Notes (Signed)
   Subjective:    Patient ID: Ralph Gray, male    DOB: Apr 04, 1935, 79 y.o.   MRN: [id number]  HPI chief complaint: Right leg pain  Patient comes in today complaining of 11/2 weeks of right hamstring pain. No trauma that he can recall. He has noticed a gradual onset of pain right in the mid substance of his right hamstring. It is present with activity but he also notices discomfort with sitting in his office chair as he notes the edge of the chair hits him across the back of his hamstring where he is sore. He has not noticed any swelling but has noticed a "lump". No radiating pain down the leg. No low back pain. No significant injury to his hamstring in the past. He has noticed that hot water seems to help. He is wearing a rather large postop shoe on his left foot. He has a history of a prior foot infection which required treatment at the wound center. Since that time he has had intermittent problems with this foot and in fact is scheduled to undergo surgery at the beginning of January with one of the local podiatrists.  Intermittent medical history reviewed Medications reviewed Allergies reviewed     Review of Systems     Objective:   Physical Exam Well-developed, well-nourished. No acute distress  Right hamstring: There is tenderness to palpation in the mid substance of the biceps femoris. No palpable defect. No soft tissue swelling. No obvious deformity. Patient ambulates with a "short leg gait" due to his large postop shoe.        Assessment & Plan:  Right hamstring strain  Patient has about a half inch discrepancy between the postop shoe that he is wearing on his left foot and the walking shoe that he has today that he is wearing on his right shoe. I provided him with a green sports insole with both a 5/16 inch and a 316 since lift applied. I am also going to use an Ace wrap for compression (I prefer this over a body helix compression sleeve; please see his medical history)  and I have given him a single Askling exercise (the extender) to perform twice daily. He can use over the counter [facility] or Aspercreme. He has a history of grade 3 renal insufficiency but I think that topical Aspercreme for a short period of time will be safe. Follow-up with me in 10 days. If symptoms persist or worsen I will consider imaging initially with the ultrasound. He will call me with questions or concerns in the interim.

## 2014-09-23 ENCOUNTER — Ambulatory Visit (INDEPENDENT_AMBULATORY_CARE_PROVIDER_SITE_OTHER): Payer: Medicare Other | Admitting: Sports Medicine

## 2014-09-23 ENCOUNTER — Telehealth: Payer: Self-pay | Admitting: *Deleted

## 2014-09-23 ENCOUNTER — Encounter: Payer: Self-pay | Admitting: Sports Medicine

## 2014-09-23 ENCOUNTER — Ambulatory Visit (INDEPENDENT_AMBULATORY_CARE_PROVIDER_SITE_OTHER): Payer: Medicare Other | Admitting: Pharmacist

## 2014-09-23 VITALS — BP 96/55 | HR 67 | Ht 71.0 in | Wt 215.0 lb

## 2014-09-23 DIAGNOSIS — S76311D Strain of muscle, fascia and tendon of the posterior muscle group at thigh level, right thigh, subsequent encounter: Secondary | ICD-10-CM

## 2014-09-23 DIAGNOSIS — I2510 Atherosclerotic heart disease of native coronary artery without angina pectoris: Secondary | ICD-10-CM

## 2014-09-23 DIAGNOSIS — Z5181 Encounter for therapeutic drug level monitoring: Secondary | ICD-10-CM | POA: Diagnosis not present

## 2014-09-23 DIAGNOSIS — Z7901 Long term (current) use of anticoagulants: Secondary | ICD-10-CM

## 2014-09-23 LAB — POCT INR: INR: 2.8

## 2014-09-23 NOTE — Telephone Encounter (Addendum)
"  I'm calling from the [facility].  The patient is here now an stated he is scheduled for surgery on 10/15/2014.  I asked him if he has to hold off on his Coumadin a few days for the surgery.  He said Dr. Paulla Dolly hasn't said anything to him about it.  I told him about the way things are normally done.  So, I'm calling to see if he needs to stop the Coumadin prior to the surgery?"  I told her I would have to ask Dr. Paulla Dolly and see what he recommends.  "Will you contact his Cardiologist?  Will you let the patient know?  Then we will know how to schedule him."  Yes, I will contact the Cardiologist if Dr. Paulla Dolly requests it.  Who is his Cardiologist?  "His Cardiologist is [name]."  I will inform the patient of Dr. Mellody Drown response.  "Okay, thanks so much."

## 2014-09-23 NOTE — Progress Notes (Signed)
   Subjective:    Patient ID: Ralph Gray, male    DOB: 1935/04/12, 79 y.o.   MRN: [id number]  HPI  Patient comes in today for follow-up on a right hamstring strain. Overall, he is feeling better although he admits his pain has not completely resolved. He has discontinued his walking boot on his left foot and is back to wearing diabetic shoes on both feet. He feels like this is made a difference. He also feels like the Askling extender exercise was helping him although he admits that he has been busy the past several days and has gotten away from doing them. He has just returned from a trip from the [place] where he was visiting his kids.   Review of Systems     Objective:   Physical Exam Well-developed, well-nourished. No acute distress  Right hamstring: Minimal tenderness to palpation over the mid substance of the biceps femoris. again no appreciable palpable defect. No soft tissue swelling. Walking without a significant antalgic gait        Assessment & Plan: improving right hamstring strain  Patient will continue in his diabetic shoes instead of his walking boot. He will resume his home rehabilitation exercises. I look for symptoms to continue to improve . he will follow-up for ongoing or recalcitrant issues .

## 2014-09-25 ENCOUNTER — Encounter: Payer: Self-pay | Admitting: *Deleted

## 2014-09-25 DIAGNOSIS — H35343 Macular cyst, hole, or pseudohole, bilateral: Secondary | ICD-10-CM | POA: Diagnosis not present

## 2014-09-25 DIAGNOSIS — H3532 Exudative age-related macular degeneration: Secondary | ICD-10-CM | POA: Diagnosis not present

## 2014-09-25 NOTE — Telephone Encounter (Signed)
Generally stop for 5 days prior but should get approval from cardiologist

## 2014-09-25 NOTE — Telephone Encounter (Signed)
I called and informed the patient that Dr. Paulla Dolly does want him to stop taking the Coumadin before surgery but we are sending a note to Dr. Burt Knack asking how to proceed.  Once we get a response, we will call and let you know.  "Okay, I am still scheduled for January 4th or 5th correct?"  Yes, you are scheduled for 10/15/2014.  I faxed a medical clearance letter to Dr. Burt Knack.

## 2014-10-01 ENCOUNTER — Telehealth: Payer: Self-pay | Admitting: *Deleted

## 2014-10-01 NOTE — Telephone Encounter (Signed)
Pt states he is scheduled for a procedure 10/15/2014 and was suppose to get instruction on Coumadin after someone discussed with his cardiologist, prior to the surgery.  Pt request a call back may change the surgery date.

## 2014-10-02 ENCOUNTER — Telehealth: Payer: Self-pay | Admitting: *Deleted

## 2014-10-02 NOTE — Telephone Encounter (Signed)
I spoke with the pt and he needs Metatarsal Head Resection 5th left foot on 10/15/14. The pt needs clearance for surgery and approval to hold Coumadin 5 days prior to surgery. I made the pt aware that Dr Burt Knack is back in the office next week and we can address message at that time.  I made the pt aware that I will contact him on cell phone next week. Pt agreed with plan.

## 2014-10-02 NOTE — Telephone Encounter (Signed)
"  I'm calling in regards to my surgery scheduled for 01/052016.  I haven't heard anything back in regards to my Coumadin.  I don't know what to do.  Do you think I should reschedule my surgery?"  I told him we have not heard from Dr. Antionette Char office.  We sent him a letter for medical clearance.  Dr. Paulla Dolly recommends you stop taking the Coumadin 5 days prior to surgery but he has to have clearance from Dr. Burt Knack.  I suggest you call Dr. Antionette Char office and see if they will give you an answer.  "I need to know something this week.  I will be out of town all of next week.  I will call Dr. Antionette Char office.  If you hear something from them next week call me on my cell and let me know what is going on."  I will.

## 2014-10-02 NOTE — Telephone Encounter (Signed)
New message     Patient going out of town on Saturday gone for the whole week.    Calling regarding cardiac clearance for foot surgery. Need to come off coumadin 5 days.    [place] [facility] surgery date on  10/15/2014.   Dr. Paulla Dolly office [phone].

## 2014-10-02 NOTE — Telephone Encounter (Signed)
Called the patient to see if he had a permanent date for metatarsal head resection by Dr. Paulla Dolly. He states that he is planning to have it done on 10/15/14 but is awaiting approval from cardiologist to hold coumadin. Instructed patient that we need to be aware of surgical date so we can ensure coumadin is held appropriately, patient verbalized understanding. The patient states he would be out of town and part of next week and could be reached by cellphone.

## 2014-10-07 NOTE — Telephone Encounter (Signed)
He can proceed with surgery at low cardiac risk. Ok to hold warfarin 5 days prior to surgery. thx  [name] 10/07/2014 11:01 AM

## 2014-10-08 NOTE — Telephone Encounter (Signed)
Pt aware.  I will forward this note to the [facility] Ila Mcgill, [place]) through [place] and fax to [phone].

## 2014-10-09 ENCOUNTER — Telehealth: Payer: Self-pay | Admitting: *Deleted

## 2014-10-09 NOTE — Telephone Encounter (Signed)
Dr. Rufina Falco for the patient to stop Coumadin 5 days prior to his procedure.  Patient was made aware per Theodosia Quay, RN.

## 2014-10-12 ENCOUNTER — Telehealth: Payer: Self-pay | Admitting: Cardiovascular Disease

## 2014-10-12 NOTE — Telephone Encounter (Signed)
Received request for surgical clearance from Dr Paulla Dolly.  Pt at low risk of cardiac complications related to foot surgery. He can proceed without further testing. He has been instructed on holding coumadin.  Ralph Gray 10/12/2014 11:17 PM

## 2014-10-15 ENCOUNTER — Encounter: Payer: Self-pay | Admitting: Podiatry

## 2014-10-15 DIAGNOSIS — M216X2 Other acquired deformities of left foot: Secondary | ICD-10-CM | POA: Diagnosis not present

## 2014-10-15 DIAGNOSIS — M898X7 Other specified disorders of bone, ankle and foot: Secondary | ICD-10-CM | POA: Diagnosis not present

## 2014-10-15 DIAGNOSIS — D1632 Benign neoplasm of short bones of left lower limb: Secondary | ICD-10-CM | POA: Diagnosis not present

## 2014-10-15 DIAGNOSIS — R0683 Snoring: Secondary | ICD-10-CM | POA: Diagnosis not present

## 2014-10-15 DIAGNOSIS — M21542 Acquired clubfoot, left foot: Secondary | ICD-10-CM | POA: Diagnosis not present

## 2014-10-16 NOTE — Progress Notes (Signed)
Dr Paulla Dolly performed a left 5th metatarsal head resection on 10/15/14

## 2014-10-21 ENCOUNTER — Telehealth: Payer: Self-pay | Admitting: Internal Medicine

## 2014-10-21 MED ORDER — LOSARTAN POTASSIUM 50 MG PO TABS
50.0000 mg | ORAL_TABLET | Freq: Two times a day (BID) | ORAL | Status: DC
Start: 2014-10-21 — End: 2015-03-06

## 2014-10-21 NOTE — Telephone Encounter (Signed)
Pt called in requesting refill for   losartan (COZAAR) 50 MG tablet [[id number]]    Refill goes to [facility]

## 2014-10-21 NOTE — Telephone Encounter (Signed)
Notified pt [facility]ill [facility]...Ralph Gray

## 2014-10-22 ENCOUNTER — Encounter: Payer: Self-pay | Admitting: Podiatry

## 2014-10-22 ENCOUNTER — Encounter: Payer: Self-pay | Admitting: Endocrinology

## 2014-10-22 ENCOUNTER — Ambulatory Visit (INDEPENDENT_AMBULATORY_CARE_PROVIDER_SITE_OTHER): Payer: Medicare Other | Admitting: Podiatry

## 2014-10-22 ENCOUNTER — Encounter: Payer: Self-pay | Admitting: Internal Medicine

## 2014-10-22 ENCOUNTER — Ambulatory Visit (INDEPENDENT_AMBULATORY_CARE_PROVIDER_SITE_OTHER): Payer: Medicare Other

## 2014-10-22 ENCOUNTER — Ambulatory Visit (INDEPENDENT_AMBULATORY_CARE_PROVIDER_SITE_OTHER): Payer: Medicare Other | Admitting: Endocrinology

## 2014-10-22 VITALS — BP 164/78 | HR 64 | Resp 14

## 2014-10-22 VITALS — BP 126/64 | HR 63 | Temp 97.4°F | Ht 71.0 in | Wt 217.0 lb

## 2014-10-22 DIAGNOSIS — Q667 Congenital pes cavus: Secondary | ICD-10-CM

## 2014-10-22 DIAGNOSIS — M216X9 Other acquired deformities of unspecified foot: Secondary | ICD-10-CM

## 2014-10-22 DIAGNOSIS — E1151 Type 2 diabetes mellitus with diabetic peripheral angiopathy without gangrene: Secondary | ICD-10-CM | POA: Diagnosis not present

## 2014-10-22 LAB — BASIC METABOLIC PANEL
BUN: 24 mg/dL — AB (ref 6–23)
CO2: 24 mEq/L (ref 19–32)
CREATININE: 1.3 mg/dL (ref 0.4–1.5)
Calcium: 9.8 mg/dL (ref 8.4–10.5)
Chloride: 108 mEq/L (ref 96–112)
GFR: 55.84 mL/min — ABNORMAL LOW (ref >60.00)
GLUCOSE: 136 mg/dL — AB (ref 70–99)
POTASSIUM: 4.4 meq/L (ref 3.5–5.1)
Sodium: 138 mEq/L (ref 135–145)

## 2014-10-22 LAB — MICROALBUMIN / CREATININE URINE RATIO
Creatinine,U: 79.4 mg/dL
MICROALB UR: 18.5 mg/dL — AB (ref 0.0–1.9)
Microalb Creat Ratio: 23.3 mg/g (ref 0.0–30.0)

## 2014-10-22 LAB — HEMOGLOBIN A1C: Hgb A1c MFr Bld: 6.9 % — ABNORMAL HIGH (ref 4.6–6.5)

## 2014-10-22 MED ORDER — CANAGLIFLOZIN 100 MG PO TABS: 100.0000 mg | ORAL_TABLET | Freq: Every day | ORAL | Status: DC

## 2014-10-22 NOTE — Patient Instructions (Addendum)
check your blood sugar once a day.  vary the time of day when you check, between before the 3 meals, and at bedtime.  also check if you have symptoms of your blood sugar being too high or too low.  please keep a record of the readings and bring it to your next appointment here.  please call us sooner if your blood sugar goes below 70, or if you have a lot of readings over 200.  Please reduce the metformin to 1 pill daily, because of your kidneys.   A blood sugar test is being requested for you today.  We'll contact you with results.   If it is high, we can add "repaglinide."  Please come back for a follow-up appointment in 3 months.

## 2014-10-22 NOTE — Progress Notes (Signed)
Subjective:     Patient ID: Ralph Gray, male   DOB: 1934/10/14, 80 y.o.   MRN: [id number]  HPI patient presents stating he is doing very well with his left foot and he has had no pain and everything seems to be healing well   Review of Systems     Objective:   Physical Exam Neurovascular status intact with wound edges coapted well left fifth metatarsal with stitches intact and minimal swelling noted. There is a slight fissure medial to the area that we operated on on the plantar surface but there is no breakdown of tissue    Assessment:     Doing very well post fifth metatarsal head resection left    Plan:     Reviewed condition and reapplied sterile dressing for edema reduction. Advised on continued surgical shoe usage for several more weeks and reappoint for suture removal and gave strict instructions of any changes should occur to let us know immediately but at this time it appears to be healing well

## 2014-10-22 NOTE — Progress Notes (Signed)
Subjective:    Patient ID: Ralph Gray, male    DOB: 12-Oct-1934, 80 y.o.   MRN: [id number]  HPI Pt returns for f/u of diabetes mellitus:  DM type: 2 Dx'ed: 5885 Complications: polyneuropathy, foot ulcer, CAD, renal insufficiency, and PAD. Therapy: 4 oral meds.  DKA: never  Severe hypoglycemia: never Pancreatitis: never  Other: he has never been on insulin; he takes 4 oral meds; he can't take actos due to edema. Interval history: Pt says exercise has been limited by recent left foot surgery.  no cbg record, but states cbg's are in the mid-100's.   Past Medical History  Diagnosis Date  . Hypertension   . Hyperlipidemia   . Peripheral neuropathy   . Macular degeneration BILATERAL    TX--- intra-orbital injections. HX  laser treatment  . Type II diabetes mellitus   . Bilateral carotid artery stenosis     PER DUPLEX 08-07-2013--  BILATERAL ICA  40-59% STENOSIS  . Anticoagulated on Coumadin   . History of prostate cancer     DX  2010--  S/P EXTERNAL RADIATION THERAPY  . Bladder tumor   . PAF (paroxysmal atrial fibrillation)     DX  2006  . First degree heart block   . Coronary artery disease CARDIOLOGIST --  DR Burt Knack    MI '99   S/P CABG  . History of MI (myocardial infarction)     1999  --  S/P CABG  . S/P CABG x 4     1999  IN [place]  . Cataract of both eyes     RESCHEDULED FOR EXTRACTIONS  MAY 2015  . Renal insufficiency   . Personal history of CLL (chronic lymphocytic leukemia)     PER LAB RESULTS IN 2010--  NO TX  ONLY BEING MONITORED  . History of hepatitis C virus infection     1990's  per lab results  --  asymptomatic ,  no tx  . Wears glasses   . At risk for sleep apnea     STOP-BANG=  5   //SENT TO PCP  02-06-2014  . Nocturia     Past Surgical History  Procedure Laterality Date  . Cardioversion  2008  . Cardiac catheterization  08-29-2012   DR COOPER    POST CABG--  PATENT LIMA to  LAD,  free RIMA  to acute marginal branch of the RCA,  &  SVG to  DIAGONAL/  TOTAL OCCLUSION OF SVG to distal RCA/  MODERATE SEGMENTAL LVSF/  80% STENOSIS  pCFX (which was not grafted)  . Transthoracic echocardiogram  08-01-2012  dr cooper    GRADE I DIASTOLIC DYSFUNCTION/  EF 55-60%/  MODERATE LAE/  AORTIC SCLEROSIS WITHOUT STENOSIS/  MILD TR  . Coronary artery bypass graft  1999    CABG X4;  in [place]  . Transurethral resection of bladder tumor N/A 02/08/2014    Procedure: TRANSURETHRAL RESECTION OF BLADDER TUMOR (TURBT) WITH GYRUS ;  Surgeon: [name], MD;  Location: [facility];  Service: Urology;  Laterality: N/A;    History   Social History  . Marital Status: Married    Spouse Name: N/A    Number of Children: N/A  . Years of Education: N/A   Occupational History  . Retired    Social History Main Topics  . Smoking status: Former Smoker -- 2.50 packs/day for 9 years    Types: Cigarettes    Quit date: 10/14/1963  . Smokeless tobacco: Never Used  .  Alcohol Use: No  . Drug Use: No  . Sexual Activity: Not on file   Other Topics Concern  . Not on file   Social History Narrative   HS[employer], [employer] - doctorate in education. Married '64 - 44yr/divorced ( severe bipolar disease); Married '07. 2 sons - '67, '66; 1 dtr - '70. 7 grandchildren. Exercise - 6 days a week: cardio and strengthening. 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 ACP- yes for CPR; no -long term ventilation; no heroic or futile measures.     Current Outpatient Prescriptions on File Prior to Visit  Medication Sig Dispense Refill  . aspirin 81 MG tablet Take 81 mg by mouth daily.      Marland Kitchen atorvastatin (LIPITOR) 10 MG tablet Take 1 tablet (10 mg total) by mouth every evening. 90 tablet 3  . bromocriptine (PARLODEL) 2.5 MG tablet TAKE 1 TABLET DAILY---  TAKES IN AM    . clotrimazole (LOTRIMIN) 1 % cream   2  . felodipine (PLENDIL) 5 MG 24 hr tablet Take 1 tablet (5 mg total) by mouth every morning. TAKE 1  TABLET DAILY 90 tablet 1  . fluticasone (FLONASE) 50 MCG/ACT nasal spray USE 2 SPRAYS NASALLY DAILY 48 g 3  . HYDROcodone-acetaminophen (NORCO) 10-325 MG per tablet Take 1 tablet by mouth every 6 (six) hours as needed.    . IODOSORB 0.9 % gel   1  . L-Methylfolate-Algae-B12-B6 (METANX) 3-90.314-2-35 MG CAPS TAKE 1 CAPSULE DAILY 90 capsule 1  . losartan (COZAAR) 50 MG tablet Take 1 tablet (50 mg total) by mouth 2 (two) times daily. 180 tablet 3  . metFORMIN (GLUCOPHAGE) 1000 MG tablet TAKE 2 TABLETS DAILY (Patient taking differently: TAKE 1 TABLET DAILY) 180 tablet 1  . montelukast (SINGULAIR) 10 MG tablet Take 1 tablet (10 mg total) by mouth at bedtime. 90 tablet 3  . mupirocin ointment (BACTROBAN) 2 %   6  . pregabalin (LYRICA) 75 MG capsule Take 1 capsule (75 mg total) by mouth 2 (two) times daily. 180 capsule 1  . SANTYL ointment   0  . sitaGLIPtin (JANUVIA) 100 MG tablet TAKE 1 TABLET DAILY 90 tablet 3  . sulfamethoxazole-trimethoprim (BACTRIM DS) 800-160 MG per tablet   1  . tamsulosin (FLOMAX) 0.4 MG CAPS capsule TAKE 1 CAPSULE TWICE A DAY 180 capsule 2  . warfarin (COUMADIN) 6 MG tablet TAKE AS DIRECTED BY COUMADIN CLINIC 130 tablet 1  . [DISCONTINUED] multivitamin (METANX) 3-35-2 MG TABS tablet Take 1 tablet by mouth daily. 90 tablet 1   No current facility-administered medications on file prior to visit.    Allergies  Allergen Reactions  . Amoxicillin Anaphylaxis  . Other Other (See Comments)    Muscle Relaxers; "I get very woozy"    Family History  Problem Relation Age of Onset  . Heart disease Mother 23    died  . Breast cancer Mother     sarcoma  . Heart attack Father 5    died  . Heart disease Father     MI  . Diabetes Neg Hx   . COPD Neg Hx     BP 126/64 mmHg  Pulse 63  Temp(Src) 97.4 F (36.3 C) (Oral)  Ht 5\' 11"  (1.803 m)  Wt 217 lb (98.431 kg)  BMI 30.28 kg/m2  SpO2 95%  Review of Systems Denies edema    Objective:   Physical Exam VITAL SIGNS:   See vs page GENERAL: no distress  Ext: no edema.   Lab Results  Component Value Date   HGBA1C 6.9* 10/22/2014       Assessment & Plan:  DM: well-controlled.  Please continue the same medications Please come back for a follow-up appointment in 3 months

## 2014-10-24 ENCOUNTER — Telehealth: Payer: Self-pay | Admitting: Endocrinology

## 2014-10-24 ENCOUNTER — Telehealth: Payer: Self-pay | Admitting: *Deleted

## 2014-10-24 ENCOUNTER — Ambulatory Visit (INDEPENDENT_AMBULATORY_CARE_PROVIDER_SITE_OTHER): Payer: Medicare Other | Admitting: Podiatry

## 2014-10-24 DIAGNOSIS — Q667 Congenital pes cavus: Secondary | ICD-10-CM

## 2014-10-24 DIAGNOSIS — M216X9 Other acquired deformities of unspecified foot: Secondary | ICD-10-CM

## 2014-10-24 NOTE — Progress Notes (Signed)
Subjective:     Patient ID: Ralph Gray, male   DOB: [birth year], 80 y.o.   MRN: [id number]  HPI patient presents stating it was bleeding a little bit so I wanted to get it checked   Review of Systems     Objective:   Physical Exam Incision site left fifth metatarsal may have gotten a little bit dry and there is slight proximal blood but no drainage erythema or edema noted    Assessment:     Probably irritation from bandage with no indications of infection    Plan:     Applied sterile dressing and keep this on for several days and then monitor and if any redness or swelling or drainage were to occur let us know immediately for antibiotic treatment

## 2014-10-24 NOTE — Telephone Encounter (Signed)
Pt states his surgical foot is bleeding and his wife states one of the sutures is opened.  I transferred pt to scheduler for an appt on the nurses schedule.

## 2014-10-24 NOTE — Telephone Encounter (Signed)
Patient is calling for the result of his labs.

## 2014-10-25 NOTE — Telephone Encounter (Signed)
Contacted pt and advised that lab work was good A1C was at a 6.9. Pt advised to continue same medication and we would see him at his next appointment.

## 2014-10-28 ENCOUNTER — Ambulatory Visit: Payer: Medicare Other

## 2014-10-29 ENCOUNTER — Ambulatory Visit: Payer: Medicare Other

## 2014-10-29 DIAGNOSIS — M216X9 Other acquired deformities of unspecified foot: Secondary | ICD-10-CM

## 2014-10-29 NOTE — Progress Notes (Signed)
Pt presents for suture removal. Sutures were removed with one remaining, will remove next office visit. No redness noted, some swelling. Wound edges were aligned and approximated. No drainage noted. Redressed with steri-strips, tefla and silvadene dressing, advised to keep dressing on for a couple days then he can get it wet/shower.  Advised to gradually start using flat surgical shoe, ice twice daily over a sock. Call if there is any drainage or redness or if pain occurs.

## 2014-11-04 ENCOUNTER — Ambulatory Visit (INDEPENDENT_AMBULATORY_CARE_PROVIDER_SITE_OTHER): Payer: Medicare Other | Admitting: *Deleted

## 2014-11-04 DIAGNOSIS — Z7901 Long term (current) use of anticoagulants: Secondary | ICD-10-CM | POA: Diagnosis not present

## 2014-11-04 DIAGNOSIS — Z5181 Encounter for therapeutic drug level monitoring: Secondary | ICD-10-CM | POA: Diagnosis not present

## 2014-11-04 LAB — POCT INR: INR: 2.7

## 2014-11-06 ENCOUNTER — Other Ambulatory Visit: Payer: Self-pay | Admitting: Endocrinology

## 2014-11-17 ENCOUNTER — Other Ambulatory Visit: Payer: Self-pay | Admitting: Internal Medicine

## 2014-11-18 ENCOUNTER — Ambulatory Visit (INDEPENDENT_AMBULATORY_CARE_PROVIDER_SITE_OTHER): Payer: Medicare Other | Admitting: Podiatry

## 2014-11-18 ENCOUNTER — Ambulatory Visit (INDEPENDENT_AMBULATORY_CARE_PROVIDER_SITE_OTHER): Payer: Medicare Other

## 2014-11-18 DIAGNOSIS — Z9889 Other specified postprocedural states: Secondary | ICD-10-CM

## 2014-11-20 NOTE — Progress Notes (Signed)
Subjective:     Patient ID: Ralph Gray, male   DOB: [birth year], 80 y.o.   MRN: [id number]  HPI patient states I'm doing real well on my left foot with swelling still present but feeling better and able to walk without pain   Review of Systems     Objective:   Physical Exam Neurovascular status intact with wound edges well coapted fifth metatarsal left and minimal edema noted    Assessment:     Doing well post fifth metatarsal head resection left with plantar callus that has completely resolved    Plan:     Final x-rays reviewed and slow return to normal activity over the next month. Explained that some swelling will be normal but if any redness any drainage or other issues were to occur he is to reappoint immediately but we'll probably take another 4-8 weeks to heal completely

## 2014-11-27 ENCOUNTER — Encounter: Payer: Self-pay | Admitting: Podiatry

## 2014-12-16 ENCOUNTER — Ambulatory Visit (INDEPENDENT_AMBULATORY_CARE_PROVIDER_SITE_OTHER): Payer: Medicare Other | Admitting: Pharmacist

## 2014-12-16 DIAGNOSIS — Z7901 Long term (current) use of anticoagulants: Secondary | ICD-10-CM

## 2014-12-16 DIAGNOSIS — Z5181 Encounter for therapeutic drug level monitoring: Secondary | ICD-10-CM | POA: Diagnosis not present

## 2014-12-16 LAB — POCT INR: INR: 2.4

## 2014-12-17 DIAGNOSIS — C679 Malignant neoplasm of bladder, unspecified: Secondary | ICD-10-CM | POA: Diagnosis not present

## 2014-12-17 DIAGNOSIS — N359 Urethral stricture, unspecified: Secondary | ICD-10-CM | POA: Diagnosis not present

## 2014-12-17 DIAGNOSIS — Z8546 Personal history of malignant neoplasm of prostate: Secondary | ICD-10-CM | POA: Diagnosis not present

## 2014-12-30 ENCOUNTER — Ambulatory Visit (INDEPENDENT_AMBULATORY_CARE_PROVIDER_SITE_OTHER): Payer: Medicare Other | Admitting: Podiatry

## 2014-12-30 ENCOUNTER — Encounter: Payer: Self-pay | Admitting: Podiatry

## 2014-12-30 DIAGNOSIS — M79673 Pain in unspecified foot: Secondary | ICD-10-CM

## 2014-12-30 DIAGNOSIS — B351 Tinea unguium: Secondary | ICD-10-CM

## 2014-12-30 DIAGNOSIS — Q667 Congenital pes cavus: Secondary | ICD-10-CM

## 2014-12-30 DIAGNOSIS — E1151 Type 2 diabetes mellitus with diabetic peripheral angiopathy without gangrene: Secondary | ICD-10-CM

## 2014-12-30 DIAGNOSIS — M216X9 Other acquired deformities of unspecified foot: Secondary | ICD-10-CM

## 2014-12-30 DIAGNOSIS — E1149 Type 2 diabetes mellitus with other diabetic neurological complication: Secondary | ICD-10-CM

## 2015-01-01 NOTE — Progress Notes (Signed)
Subjective:     Patient ID: Ralph Gray, male   DOB: 07/16/1935, 80 y.o.   MRN: [id number]  HPI patient presents with history of ulceration fifth metatarsal head left and long-term diabetes with neurological vascular disease. Also has nail disease 1-5 both feet that are thick incurvated the corners and become tender and he cannot cut   Review of Systems     Objective:   Physical Exam Neurovascular status is diminished with thick yellow brittle nailbeds and history of ulceration fifth metatarsal left    Assessment:     At risk diabetic with neurovascular disease of long-term nature and nail disease with pain 1-5 both feet along with previous ulceration    Plan:     Recommended new diabetic shoes and he is casted for these today and debrided painful nailbeds 1-5 both feet with no iatrogenic bleeding noted

## 2015-01-13 ENCOUNTER — Other Ambulatory Visit: Payer: Self-pay | Admitting: Internal Medicine

## 2015-01-15 ENCOUNTER — Other Ambulatory Visit: Payer: Self-pay | Admitting: Endocrinology

## 2015-01-21 ENCOUNTER — Encounter: Payer: Self-pay | Admitting: Endocrinology

## 2015-01-21 ENCOUNTER — Ambulatory Visit (INDEPENDENT_AMBULATORY_CARE_PROVIDER_SITE_OTHER): Payer: Medicare Other | Admitting: Endocrinology

## 2015-01-21 VITALS — BP 118/62 | HR 59 | Temp 97.7°F | Ht 71.0 in | Wt 209.0 lb

## 2015-01-21 DIAGNOSIS — E1151 Type 2 diabetes mellitus with diabetic peripheral angiopathy without gangrene: Secondary | ICD-10-CM | POA: Diagnosis not present

## 2015-01-21 LAB — HEMOGLOBIN A1C: Hgb A1c MFr Bld: 6.8 % — ABNORMAL HIGH (ref 4.6–6.5)

## 2015-01-21 MED ORDER — METFORMIN HCL 1000 MG PO TABS: 1000.0000 mg | ORAL_TABLET | Freq: Every day | ORAL | Status: DC

## 2015-01-21 NOTE — Patient Instructions (Addendum)
check your blood sugar once a day.  vary the time of day when you check, between before the 3 meals, and at bedtime.  Checking the bedtime will help [place] understand why it is high in the morning.  also check if you have symptoms of your blood sugar being too high or too low.  please keep a record of the readings and bring it to your next appointment here.  please call us sooner if your blood sugar goes below 70, or if you have a lot of readings over 200.  blood tests are requested for you today.  We'll let you know about the results.   If it is high, we can add "repaglinide" with supper.   Please come back for a follow-up appointment in 3 months.

## 2015-01-21 NOTE — Progress Notes (Signed)
Subjective:    Patient ID: Ralph Gray, male    DOB: 1935-07-31, 80 y.o.   MRN: [id number]  HPI Pt returns for f/u of diabetes mellitus:  DM type: 2 Dx'ed: 7681 Complications: polyneuropathy, foot ulcer, CAD, renal insufficiency, and PAD. Therapy: 4 oral meds.  DKA: never  Severe hypoglycemia: never Pancreatitis: never  Other: he has never been on insulin; he can't take actos due to edema. Interval history: he brings a record of his cbg's which i have reviewed today.  It varies from 100-170.  It is highest in am, but he does not check at hs.  pt states he feels well in general, as his left foot surgery has healed.   Past Medical History  Diagnosis Date  . Hypertension   . Hyperlipidemia   . Peripheral neuropathy   . Macular degeneration BILATERAL    TX--- intra-orbital injections. HX  laser treatment  . Type II diabetes mellitus   . Bilateral carotid artery stenosis     PER DUPLEX 08-07-2013--  BILATERAL ICA  40-59% STENOSIS  . Anticoagulated on Coumadin   . History of prostate cancer     DX  2010--  S/P EXTERNAL RADIATION THERAPY  . Bladder tumor   . PAF (paroxysmal atrial fibrillation)     DX  2006  . First degree heart block   . Coronary artery disease CARDIOLOGIST --  DR Burt Knack    MI '99   S/P CABG  . History of MI (myocardial infarction)     1999  --  S/P CABG  . S/P CABG x 4     1999  IN [place]  . Cataract of both eyes     RESCHEDULED FOR EXTRACTIONS  MAY 2015  . Renal insufficiency   . Personal history of CLL (chronic lymphocytic leukemia)     PER LAB RESULTS IN 2010--  NO TX  ONLY BEING MONITORED  . History of hepatitis C virus infection     1990's  per lab results  --  asymptomatic ,  no tx  . Wears glasses   . At risk for sleep apnea     STOP-BANG=  5   //SENT TO PCP  02-06-2014  . Nocturia     Past Surgical History  Procedure Laterality Date  . Cardioversion  2008  . Cardiac catheterization  08-29-2012   DR COOPER    POST CABG--  PATENT  LIMA to  LAD,  free RIMA  to acute marginal branch of the RCA,  &  SVG to DIAGONAL/  TOTAL OCCLUSION OF SVG to distal RCA/  MODERATE SEGMENTAL LVSF/  80% STENOSIS  pCFX (which was not grafted)  . Transthoracic echocardiogram  08-01-2012  dr cooper    GRADE I DIASTOLIC DYSFUNCTION/  EF 55-60%/  MODERATE LAE/  AORTIC SCLEROSIS WITHOUT STENOSIS/  MILD TR  . Coronary artery bypass graft  1999    CABG X4;  in [place]  . Transurethral resection of bladder tumor N/A 02/08/2014    Procedure: TRANSURETHRAL RESECTION OF BLADDER TUMOR (TURBT) WITH GYRUS ;  Surgeon: [name], MD;  Location: [facility];  Service: Urology;  Laterality: N/A;    History   Social History  . Marital Status: Married    Spouse Name: N/A  . Number of Children: N/A  . Years of Education: N/A   Occupational History  . Retired    Social History Main Topics  . Smoking status: Former Smoker -- 2.50 packs/day for 9 years  Types: Cigarettes    Quit date: 10/14/1963  . Smokeless tobacco: Never Used  . Alcohol Use: No  . Drug Use: No  . Sexual Activity: Not on file   Other Topics Concern  . Not on file   Social History Narrative   [employer], [employer] - doctorate in education. Married '64 - 43yr/divorced ( severe bipolar disease); Married '07. 2 sons - '67, '66; 1 dtr - '70. 7 grandchildren. Exercise - 6 days a week: cardio and strengthening. 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 ACP- yes for CPR; no -long term ventilation; no heroic or futile measures.     Current Outpatient Prescriptions on File Prior to Visit  Medication Sig Dispense Refill  . aspirin 81 MG tablet Take 81 mg by mouth daily.      Marland Kitchen atorvastatin (LIPITOR) 10 MG tablet Take 1 tablet (10 mg total) by mouth every evening. 90 tablet 3  . bromocriptine (PARLODEL) 2.5 MG tablet TAKE 1 TABLET DAILY 90 tablet 1  . canagliflozin (INVOKANA) 100 MG TABS tablet Take 1 tablet (100 mg  total) by mouth daily. 90 tablet 3  . clotrimazole (LOTRIMIN) 1 % cream   2  . felodipine (PLENDIL) 5 MG 24 hr tablet Take 1 tablet (5 mg total) by mouth every morning. TAKE 1 TABLET DAILY 90 tablet 1  . fluticasone (FLONASE) 50 MCG/ACT nasal spray USE 2 SPRAYS NASALLY DAILY 48 g 3  . HYDROcodone-acetaminophen (NORCO) 10-325 MG per tablet Take 1 tablet by mouth every 6 (six) hours as needed.    . IODOSORB 0.9 % gel   1  . L-Methylfolate-Algae-B12-B6 (METANX) 3-90.314-2-35 MG CAPS TAKE 1 CAPSULE DAILY 90 capsule 1  . losartan (COZAAR) 50 MG tablet Take 1 tablet (50 mg total) by mouth 2 (two) times daily. 180 tablet 3  . montelukast (SINGULAIR) 10 MG tablet TAKE 1 TABLET AT BEDTIME 90 tablet 2  . mupirocin ointment (BACTROBAN) 2 %   6  . pregabalin (LYRICA) 75 MG capsule Take 1 capsule (75 mg total) by mouth 2 (two) times daily. 180 capsule 1  . SANTYL ointment   0  . sitaGLIPtin (JANUVIA) 100 MG tablet TAKE 1 TABLET DAILY 90 tablet 3  . sulfamethoxazole-trimethoprim (BACTRIM DS) 800-160 MG per tablet   1  . tamsulosin (FLOMAX) 0.4 MG CAPS capsule TAKE 1 CAPSULE TWICE A DAY 180 capsule 3  . warfarin (COUMADIN) 6 MG tablet TAKE AS DIRECTED BY COUMADIN CLINIC 130 tablet 1  . [DISCONTINUED] multivitamin (METANX) 3-35-2 MG TABS tablet Take 1 tablet by mouth daily. 90 tablet 1   No current facility-administered medications on file prior to visit.    Allergies  Allergen Reactions  . Amoxicillin Anaphylaxis  . Other Other (See Comments)    Muscle Relaxers; "I get very woozy"    Family History  Problem Relation Age of Onset  . Heart disease Mother 29    died  . Breast cancer Mother     sarcoma  . Heart attack Father 81    died  . Heart disease Father     MI  . Diabetes Neg Hx   . COPD Neg Hx     BP 118/62 mmHg  Pulse 59  Temp(Src) 97.7 F (36.5 C) (Oral)  Ht 5\' 11"  (1.803 m)  Wt 209 lb (94.802 kg)  BMI 29.16 kg/m2  SpO2 96%  Review of Systems He denies hypoglycemia.  He has  lost a few lbs.  Objective:   Physical Exam VITAL SIGNS:  See vs page GENERAL: no distress EXTEMITIES: 1+ bilat leg edema.  There is bilateral onychomycosis of the toenails Skin: There are bilat varicosities, and rust-colored skin, on the legs.  healed surgical scar on the left foot Pulses: dorsalis pedis intact on the right foot.   NEURO: sensation is intact to touch, but decreased from normal on the right foot  Lab Results  Component Value Date   HGBA1C 6.8* 01/21/2015       Assessment & Plan:  DM: well-controlled  Patient is advised the following: Patient Instructions  check your blood sugar once a day.  vary the time of day when you check, between before the 3 meals, and at bedtime.  Checking the bedtime will help [place] understand why it is high in the morning.  also check if you have symptoms of your blood sugar being too high or too low.  please keep a record of the readings and bring it to your next appointment here.  please call us sooner if your blood sugar goes below 70, or if you have a lot of readings over 200.  blood tests are requested for you today.  We'll let you know about the results.   If it is high, we can add "repaglinide" with supper.   Please come back for a follow-up appointment in 3 months.        addendum: Please continue the same medications.

## 2015-01-27 ENCOUNTER — Other Ambulatory Visit: Payer: Self-pay | Admitting: Internal Medicine

## 2015-02-03 ENCOUNTER — Ambulatory Visit (INDEPENDENT_AMBULATORY_CARE_PROVIDER_SITE_OTHER): Payer: Medicare Other | Admitting: *Deleted

## 2015-02-03 DIAGNOSIS — Z5181 Encounter for therapeutic drug level monitoring: Secondary | ICD-10-CM | POA: Diagnosis not present

## 2015-02-03 DIAGNOSIS — Z7901 Long term (current) use of anticoagulants: Secondary | ICD-10-CM

## 2015-02-03 LAB — POCT INR: INR: 3.4

## 2015-02-04 ENCOUNTER — Telehealth: Payer: Self-pay | Admitting: Internal Medicine

## 2015-02-04 ENCOUNTER — Telehealth: Payer: Self-pay | Admitting: Endocrinology

## 2015-02-04 DIAGNOSIS — G629 Polyneuropathy, unspecified: Secondary | ICD-10-CM

## 2015-02-04 MED ORDER — PREGABALIN 75 MG PO CAPS: 75.0000 mg | ORAL_CAPSULE | Freq: Two times a day (BID) | ORAL | Status: DC

## 2015-02-04 NOTE — Telephone Encounter (Signed)
See note below. The paper work the pt is referring to is from [employer] and the page his referring to is the note from Dr. Paulla Dolly requesting your signature on his note. These papers have already been faxed and sent to scanning. Please advise, Thanks!

## 2015-02-04 NOTE — Telephone Encounter (Signed)
Patient is requesting lyrica to be [facility]

## 2015-02-04 NOTE — Telephone Encounter (Signed)
Patient called stating that Dr. [name] never signed the second page for him to receive his diabetic shoes   Please advise patient    Thank you

## 2015-02-04 NOTE — Telephone Encounter (Signed)
Don't worry.  This page is not needed, as my notes are on epic

## 2015-02-04 NOTE — Telephone Encounter (Signed)
done

## 2015-02-05 NOTE — Telephone Encounter (Signed)
Left voicemail advising of note below. Requested call back if patient would like to discuss.

## 2015-02-05 NOTE — Telephone Encounter (Signed)
Patient returned you call. 

## 2015-02-05 NOTE — Telephone Encounter (Signed)
Patient called and [facility] is waiting to hear back from [place] about some missing information on prescription. Please call pharmacy

## 2015-02-05 NOTE — Telephone Encounter (Signed)
Unable to reach patient, will try at a later time.

## 2015-02-06 NOTE — Telephone Encounter (Signed)
[facility] to see what inform ation they are needing. Spoke with rep Hilliard Clark he stated med needs a prior authorization # [id number]. Member ID [id number]...Ralph Gray

## 2015-02-06 NOTE — Telephone Encounter (Signed)
Patient states he can be reach on cell if not at home number.

## 2015-02-06 NOTE — Telephone Encounter (Signed)
Tried to complete PA but it stated PA is not needed. [facility] spoke with pharmacy rep which she stated rx was received on 02/04/15, and prescription is in process. It takes 8-10 days for pt t received med from the date they received script. Called pt to let him know status...Ralph Gray

## 2015-02-06 NOTE — Telephone Encounter (Signed)
Patient has called back in regards.  He is upset b/c he is running out of meds and states has called three days in a row.  Patient is requesting a call back in regards.

## 2015-02-10 ENCOUNTER — Telehealth: Payer: Self-pay | Admitting: *Deleted

## 2015-02-10 NOTE — Telephone Encounter (Signed)
PATIENT IS REQUESTING THAT DR REGAL CALL HIM AND ALSO CALL DR [name] TO DISCUSS WHY DR ELLISON WILL NOT SIGN FOR HIS DIABETIC SHOES.   I HAVE SPOKEN TO PATIENT AND DR ELLISON'S NURSE MEGAN ON SEVERAL OCCASIONS. MEGAN STATED SHE DISCUSSED THE NEED OF SIGNATURES ON BOTH PAGES SHE STATED DR ELLISON WILL NOT SIGN THE SECOND PAGE STATING "WILL NOT SIGN THE NOTE OF ANOTHER PROVIDER"  I HAVE EXPLAINED TO THE PATIENT THAT THERE IS NOTHING FURTHER WE CAN DO THAT THE DECISION LIES WITH DR [name] BUT HE IS INSISTING THE DR REGAL CALL HIM AND DR [name] AND GET THIS STRAIGHTENED OUT BECAUSE HE NEEDS HIS SHOES

## 2015-02-13 ENCOUNTER — Other Ambulatory Visit: Payer: Self-pay | Admitting: Internal Medicine

## 2015-02-13 ENCOUNTER — Other Ambulatory Visit: Payer: Self-pay | Admitting: General Practice

## 2015-02-13 MED ORDER — WARFARIN SODIUM 6 MG PO TABS: ORAL_TABLET | ORAL | Status: DC

## 2015-02-14 DIAGNOSIS — H43823 Vitreomacular adhesion, bilateral: Secondary | ICD-10-CM | POA: Diagnosis not present

## 2015-02-14 DIAGNOSIS — H35343 Macular cyst, hole, or pseudohole, bilateral: Secondary | ICD-10-CM | POA: Diagnosis not present

## 2015-02-14 DIAGNOSIS — H3532 Exudative age-related macular degeneration: Secondary | ICD-10-CM | POA: Diagnosis not present

## 2015-02-16 ENCOUNTER — Emergency Department (HOSPITAL_COMMUNITY)
Admission: EM | Admit: 2015-02-16 | Discharge: 2015-02-16 | Disposition: A | Discharge status: Home / Self Care | Payer: Medicare Other | Attending: Emergency Medicine | Admitting: Emergency Medicine

## 2015-02-16 ENCOUNTER — Emergency Department (HOSPITAL_COMMUNITY): Payer: Medicare Other

## 2015-02-16 ENCOUNTER — Encounter (HOSPITAL_COMMUNITY): Payer: Self-pay | Admitting: Emergency Medicine

## 2015-02-16 DIAGNOSIS — E119 Type 2 diabetes mellitus without complications: Secondary | ICD-10-CM | POA: Insufficient documentation

## 2015-02-16 DIAGNOSIS — J159 Unspecified bacterial pneumonia: Secondary | ICD-10-CM | POA: Diagnosis not present

## 2015-02-16 DIAGNOSIS — I252 Old myocardial infarction: Secondary | ICD-10-CM | POA: Insufficient documentation

## 2015-02-16 DIAGNOSIS — Z880 Allergy status to penicillin: Secondary | ICD-10-CM | POA: Insufficient documentation

## 2015-02-16 DIAGNOSIS — Z7901 Long term (current) use of anticoagulants: Secondary | ICD-10-CM | POA: Diagnosis not present

## 2015-02-16 DIAGNOSIS — Z7951 Long term (current) use of inhaled steroids: Secondary | ICD-10-CM | POA: Insufficient documentation

## 2015-02-16 DIAGNOSIS — H109 Unspecified conjunctivitis: Secondary | ICD-10-CM

## 2015-02-16 DIAGNOSIS — Z9889 Other specified postprocedural states: Secondary | ICD-10-CM | POA: Diagnosis not present

## 2015-02-16 DIAGNOSIS — Z86018 Personal history of other benign neoplasm: Secondary | ICD-10-CM | POA: Diagnosis not present

## 2015-02-16 DIAGNOSIS — Z8619 Personal history of other infectious and parasitic diseases: Secondary | ICD-10-CM | POA: Insufficient documentation

## 2015-02-16 DIAGNOSIS — H10023 Other mucopurulent conjunctivitis, bilateral: Secondary | ICD-10-CM | POA: Diagnosis not present

## 2015-02-16 DIAGNOSIS — E785 Hyperlipidemia, unspecified: Secondary | ICD-10-CM | POA: Insufficient documentation

## 2015-02-16 DIAGNOSIS — Z79899 Other long term (current) drug therapy: Secondary | ICD-10-CM | POA: Insufficient documentation

## 2015-02-16 DIAGNOSIS — J189 Pneumonia, unspecified organism: Secondary | ICD-10-CM | POA: Diagnosis not present

## 2015-02-16 DIAGNOSIS — Z856 Personal history of leukemia: Secondary | ICD-10-CM | POA: Diagnosis not present

## 2015-02-16 DIAGNOSIS — H1033 Unspecified acute conjunctivitis, bilateral: Secondary | ICD-10-CM | POA: Diagnosis not present

## 2015-02-16 DIAGNOSIS — Z8546 Personal history of malignant neoplasm of prostate: Secondary | ICD-10-CM | POA: Diagnosis not present

## 2015-02-16 DIAGNOSIS — I10 Essential (primary) hypertension: Secondary | ICD-10-CM | POA: Insufficient documentation

## 2015-02-16 DIAGNOSIS — Z87891 Personal history of nicotine dependence: Secondary | ICD-10-CM | POA: Insufficient documentation

## 2015-02-16 DIAGNOSIS — Z7982 Long term (current) use of aspirin: Secondary | ICD-10-CM | POA: Insufficient documentation

## 2015-02-16 DIAGNOSIS — Z951 Presence of aortocoronary bypass graft: Secondary | ICD-10-CM | POA: Insufficient documentation

## 2015-02-16 DIAGNOSIS — R0602 Shortness of breath: Secondary | ICD-10-CM | POA: Diagnosis not present

## 2015-02-16 DIAGNOSIS — I2510 Atherosclerotic heart disease of native coronary artery without angina pectoris: Secondary | ICD-10-CM | POA: Insufficient documentation

## 2015-02-16 LAB — CBC
HCT: 42.3 % (ref 39.0–52.0)
Hemoglobin: 14.5 g/dL (ref 13.0–17.0)
MCH: 30.7 pg (ref 26.0–34.0)
MCHC: 34.3 g/dL (ref 30.0–36.0)
MCV: 89.6 fL (ref 78.0–100.0)
PLATELETS: 136 10*3/uL — AB (ref 150–400)
RBC: 4.72 MIL/uL (ref 4.22–5.81)
RDW: 14.2 % (ref 11.5–15.5)
WBC: 11.2 10*3/uL — AB (ref 4.0–10.5)

## 2015-02-16 LAB — BASIC METABOLIC PANEL
ANION GAP: 11 (ref 5–15)
BUN: 22 mg/dL — ABNORMAL HIGH (ref 6–20)
CHLORIDE: 104 mmol/L (ref 101–111)
CO2: 23 mmol/L (ref 22–32)
Calcium: 9.1 mg/dL (ref 8.9–10.3)
Creatinine, Ser: 1.6 mg/dL — ABNORMAL HIGH (ref 0.61–1.24)
GFR calc non Af Amer: 40 mL/min — ABNORMAL LOW (ref >60)
GFR, EST AFRICAN AMERICAN: 46 mL/min — AB (ref >60)
Glucose, Bld: 172 mg/dL — ABNORMAL HIGH (ref 70–99)
Potassium: 4.6 mmol/L (ref 3.5–5.1)
SODIUM: 138 mmol/L (ref 135–145)

## 2015-02-16 LAB — PROTIME-INR
INR: 2.4 — ABNORMAL HIGH (ref 0.00–1.49)
PROTHROMBIN TIME: 26.4 s — AB (ref 11.6–15.2)

## 2015-02-16 LAB — I-STAT TROPONIN, ED: TROPONIN I, POC: 0.01 ng/mL (ref 0.00–0.08)

## 2015-02-16 LAB — BRAIN NATRIURETIC PEPTIDE: B Natriuretic Peptide: 383.9 pg/mL — ABNORMAL HIGH (ref 0.0–100.0)

## 2015-02-16 MED ORDER — TETRACAINE HCL 0.5 % OP SOLN
2.0000 [drp] | Freq: Once | OPHTHALMIC | Status: AC
  Administered 2015-02-16: 2 [drp] via OPHTHALMIC
  Filled 2015-02-16: qty 2

## 2015-02-16 MED ORDER — LEVOFLOXACIN 500 MG PO TABS: 500.0000 mg | ORAL_TABLET | Freq: Every day | ORAL | Status: DC

## 2015-02-16 MED ORDER — FLUORESCEIN SODIUM 1 MG OP STRP
1.0000 | ORAL_STRIP | Freq: Once | OPHTHALMIC | Status: AC
  Administered 2015-02-16: 1 via OPHTHALMIC
  Filled 2015-02-16: qty 1

## 2015-02-16 MED ORDER — LIDOCAINE HCL (PF) 1 % IJ SOLN
0.9000 mL | Freq: Once | INTRAMUSCULAR | Status: AC
  Administered 2015-02-16: 2.1 mL
  Filled 2015-02-16: qty 5

## 2015-02-16 MED ORDER — AZITHROMYCIN 250 MG PO TABS
1000.0000 mg | ORAL_TABLET | Freq: Once | ORAL | Status: AC
  Administered 2015-02-16: 1000 mg via ORAL
  Filled 2015-02-16: qty 4

## 2015-02-16 MED ORDER — CEFTRIAXONE SODIUM 1 G IJ SOLR
1.0000 g | Freq: Once | INTRAMUSCULAR | Status: AC
  Administered 2015-02-16: 1 g via INTRAMUSCULAR
  Filled 2015-02-16: qty 10

## 2015-02-16 NOTE — ED Notes (Signed)
Pt c/o congestion, pink eye, shortness of breath and irregular heartbeat onset Friday. Pt has tried zicam today. [facility] to come to ED for further eval of irregular heartbeat.

## 2015-02-16 NOTE — ED Notes (Signed)
Patient ambulated with no complaints of dizziness or ShOB.  Pulse ox read 94-97%.

## 2015-02-16 NOTE — Discharge Instructions (Signed)
Pneumonia Follow up with your doctor. The antibiotic may influence your INR.  follow-up with her doctor for close follow-up of your INR. Return to the ED if he develop chest pain, shortness of breath or worsening symptoms. Pneumonia is an infection of the lungs.  CAUSES Pneumonia may be caused by bacteria or a virus. Usually, these infections are caused by breathing infectious particles into the lungs (respiratory tract). SIGNS AND SYMPTOMS   Cough.  Fever.  Chest pain.  Increased rate of breathing.  Wheezing.  Mucus production. DIAGNOSIS  If you have the common symptoms of pneumonia, your health care provider will typically confirm the diagnosis with a chest X-ray. The X-ray will show an abnormality in the lung (pulmonary infiltrate) if you have pneumonia. Other tests of your blood, urine, or sputum may be done to find the specific cause of your pneumonia. Your health care provider may also do tests (blood gases or pulse oximetry) to see how well your lungs are working. TREATMENT  Some forms of pneumonia may be spread to other people when you cough or sneeze. You may be asked to wear a mask before and during your exam. Pneumonia that is caused by bacteria is treated with antibiotic medicine. Pneumonia that is caused by the influenza virus may be treated with an antiviral medicine. Most other viral infections must run their course. These infections will not respond to antibiotics.  HOME CARE INSTRUCTIONS   Cough suppressants may be used if you are losing too much rest. However, coughing protects you by clearing your lungs. You should avoid using cough suppressants if you can.  Your health care provider may have prescribed medicine if he or she thinks your pneumonia is caused by bacteria or influenza. Finish your medicine even if you start to feel better.  Your health care provider may also prescribe an expectorant. This loosens the mucus to be coughed up.  Take medicines only as  directed by your health care provider.  Do not smoke. Smoking is a common cause of bronchitis and can contribute to pneumonia. If you are a smoker and continue to smoke, your cough may last several weeks after your pneumonia has cleared.  A cold steam vaporizer or humidifier in your room or home may help loosen mucus.  Coughing is often worse at night. Sleeping in a semi-upright position in a recliner or using a couple pillows under your head will help with this.  Get rest as you feel it is needed. Your body will usually let you know when you need to rest. PREVENTION A pneumococcal shot (vaccine) is available to prevent a common bacterial cause of pneumonia. This is usually suggested for:  People over 41 years old.  Patients on chemotherapy.  People with chronic lung problems, such as bronchitis or emphysema.  People with immune system problems. If you are over 65 or have a high risk condition, you may receive the pneumococcal vaccine if you have not received it before. In some countries, a routine influenza vaccine is also recommended. This vaccine can help prevent some cases of pneumonia.You may be offered the influenza vaccine as part of your care. If you smoke, it is time to quit. You may receive instructions on how to stop smoking. Your health care provider can provide medicines and counseling to help you quit. SEEK MEDICAL CARE IF: You have a fever. SEEK IMMEDIATE MEDICAL CARE IF:   Your illness becomes worse. This is especially true if you are elderly or weakened from any other  disease.  You cannot control your cough with suppressants and are losing sleep.  You begin coughing up blood.  You develop pain which is getting worse or is uncontrolled with medicines.  Any of the symptoms which initially brought you in for treatment are getting worse rather than better.  You develop shortness of breath or chest pain. MAKE SURE YOU:   Understand these instructions.  Will watch  your condition.  Will get help right away if you are not doing well or get worse. Document Released: 09/27/2005 Document Revised: 02/11/2014 Document Reviewed: 12/17/2010 [facility] Patient Information 2015 [place], [place]. This information is not intended to replace advice given to you by your health care provider. Make sure you discuss any questions you have with your health care provider.   Bacterial Conjunctivitis Bacterial conjunctivitis, commonly called pink eye, is an inflammation of the clear membrane that covers the white part of the eye (conjunctiva). The inflammation can also happen on the underside of the eyelids. The blood vessels in the conjunctiva become inflamed, causing the eye to become red or pink. Bacterial conjunctivitis may spread easily from one eye to another and from person to person (contagious).  CAUSES  Bacterial conjunctivitis is caused by bacteria. The bacteria may come from your own skin, your upper respiratory tract, or from someone else with bacterial conjunctivitis. SYMPTOMS  The normally white color of the eye or the underside of the eyelid is usually pink or red. The pink eye is usually associated with irritation, tearing, and some sensitivity to light. Bacterial conjunctivitis is often associated with a thick, yellowish discharge from the eye. The discharge may turn into a crust on the eyelids overnight, which causes your eyelids to stick together. If a discharge is present, there may also be some blurred vision in the affected eye. DIAGNOSIS  Bacterial conjunctivitis is diagnosed by your caregiver through an eye exam and the symptoms that you report. Your caregiver looks for changes in the surface tissues of your eyes, which may point to the specific type of conjunctivitis. A sample of any discharge may be collected on a cotton-tip swab if you have a severe case of conjunctivitis, if your cornea is affected, or if you keep getting repeat infections that do not  respond to treatment. The sample will be sent to a lab to see if the inflammation is caused by a bacterial infection and to see if the infection will respond to antibiotic medicines. TREATMENT   Bacterial conjunctivitis is treated with antibiotics. Antibiotic eyedrops are most often used. However, antibiotic ointments are also available. Antibiotics pills are sometimes used. Artificial tears or eye washes may ease discomfort. HOME CARE INSTRUCTIONS   To ease discomfort, apply a cool, clean washcloth to your eye for 10-20 minutes, 3-4 times a day.  Gently wipe away any drainage from your eye with a warm, wet washcloth or a cotton ball.  Wash your hands often with soap and water. Use paper towels to dry your hands.  Do not share towels or washcloths. This may spread the infection.  Change or wash your pillowcase every day.  You should not use eye makeup until the infection is gone.  Do not operate machinery or drive if your vision is blurred.  Stop using contact lenses. Ask your caregiver how to sterilize or replace your contacts before using them again. This depends on the type of contact lenses that you use.  When applying medicine to the infected eye, do not touch the edge of your eyelid  with the eyedrop bottle or ointment tube. SEEK IMMEDIATE MEDICAL CARE IF:   Your infection has not improved within 3 days after beginning treatment.  You had yellow discharge from your eye and it returns.  You have increased eye pain.  Your eye redness is spreading.  Your vision becomes blurred.  You have a fever or persistent symptoms for more than 2-3 days.  You have a fever and your symptoms suddenly get worse.  You have facial pain, redness, or swelling. MAKE SURE YOU:   Understand these instructions.  Will watch your condition.  Will get help right away if you are not doing well or get worse. Document Released: 09/27/2005 Document Revised: 02/11/2014 Document Reviewed:  02/28/2012 [facility] Patient Information 2015 [place], [place]. This information is not intended to replace advice given to you by your health care provider. Make sure you discuss any questions you have with your health care provider.

## 2015-02-16 NOTE — ED Provider Notes (Signed)
CSN: [id number]     Arrival date & time 02/16/15  1316 History   First MD Initiated Contact with Patient 02/16/15 1456     Chief Complaint  Patient presents with  . Conjunctivitis  . Shortness of Breath     (Consider location/radiation/quality/duration/timing/severity/associated sxs/prior Treatment) HPI Comments: Patient from urgent care with cough, congestion, "nasal congestion", pink eye that onset 2 days ago. Denies sick contacts.  Denies chest pain.  Endorses mild SOB.  Seen at urgent care and given tobramycin for conjuncitivitis.  Sent to ED with "irregular heart beat".  Patient with history of paroxysmal atrial fibrillation. Patient on coumadin and states compliance.  Denies any eye pain.  He has mild blurry vision.  Does not wear contacts.  No leg pain or leg swelling.  No abdominal pain, nausea, or vomiting. Patient states he was seeing his retina specialist 2 days ago to check his macular degeneration and the eye drainage started after that.  Patient is a 80 y.o. male presenting with shortness of breath. The history is provided by the patient and a relative.  Shortness of Breath Associated symptoms: cough and sore throat   Associated symptoms: no abdominal pain, no fever, no headaches, no rash and no vomiting     Past Medical History  Diagnosis Date  . Hypertension   . Hyperlipidemia   . Peripheral neuropathy   . Macular degeneration BILATERAL    TX--- intra-orbital injections. HX  laser treatment  . Type II diabetes mellitus   . Bilateral carotid artery stenosis     PER DUPLEX 08-07-2013--  BILATERAL ICA  40-59% STENOSIS  . Anticoagulated on Coumadin   . History of prostate cancer     DX  2010--  S/P EXTERNAL RADIATION THERAPY  . Bladder tumor   . PAF (paroxysmal atrial fibrillation)     DX  2006  . First degree heart block   . Coronary artery disease CARDIOLOGIST --  DR Burt Knack    MI '99   S/P CABG  . History of MI (myocardial infarction)     1999  --  S/P CABG  . S/P  CABG x 4     1999  IN [place]  . Cataract of both eyes     RESCHEDULED FOR EXTRACTIONS  MAY 2015  . Renal insufficiency   . Personal history of CLL (chronic lymphocytic leukemia)     PER LAB RESULTS IN 2010--  NO TX  ONLY BEING MONITORED  . History of hepatitis C virus infection     1990's  per lab results  --  asymptomatic ,  no tx  . Wears glasses   . At risk for sleep apnea     STOP-BANG=  5   //SENT TO PCP  02-06-2014  . Nocturia    Past Surgical History  Procedure Laterality Date  . Cardioversion  2008  . Cardiac catheterization  08-29-2012   DR COOPER    POST CABG--  PATENT LIMA to  LAD,  free RIMA  to acute marginal branch of the RCA,  &  SVG to DIAGONAL/  TOTAL OCCLUSION OF SVG to distal RCA/  MODERATE SEGMENTAL LVSF/  80% STENOSIS  pCFX (which was not grafted)  . Transthoracic echocardiogram  08-01-2012  dr cooper    GRADE I DIASTOLIC DYSFUNCTION/  EF 55-60%/  MODERATE LAE/  AORTIC SCLEROSIS WITHOUT STENOSIS/  MILD TR  . Coronary artery bypass graft  1999    CABG X4;  in [place]  . Transurethral resection  of bladder tumor N/A 02/08/2014    Procedure: TRANSURETHRAL RESECTION OF BLADDER TUMOR (TURBT) WITH GYRUS ;  Surgeon: [name], MD;  Location: [facility];  Service: Urology;  Laterality: N/A;   Family History  Problem Relation Age of Onset  . Heart disease Mother 42    died  . Breast cancer Mother     sarcoma  . Heart attack Father 94    died  . Heart disease Father     MI  . Diabetes Neg Hx   . COPD Neg Hx    History  Substance Use Topics  . Smoking status: Former Smoker -- 2.50 packs/day for 9 years    Types: Cigarettes    Quit date: 10/14/1963  . Smokeless tobacco: Never Used  . Alcohol Use: No    Review of Systems  Constitutional: Negative for fever, activity change and appetite change.  HENT: Positive for congestion, rhinorrhea, sinus pressure and sore throat.   Eyes: Positive for discharge, redness and itching. Negative  for visual disturbance.  Respiratory: Positive for cough and shortness of breath. Negative for chest tightness.   Gastrointestinal: Negative for nausea, vomiting and abdominal pain.  Genitourinary: Negative for testicular pain.  Musculoskeletal: Negative for myalgias and arthralgias.  Skin: Negative for rash.  Neurological: Negative for dizziness, weakness and headaches.  A complete 10 system review of systems was obtained and all systems are negative except as noted in the HPI and PMH.      Allergies  Amoxicillin and Other  Home Medications   Prior to Admission medications   Medication Sig Start Date End Date Taking? Authorizing Provider  aspirin 81 MG tablet Take 81 mg by mouth daily.      Historical Provider, MD  atorvastatin (LIPITOR) 10 MG tablet Take 1 tablet (10 mg total) by mouth every evening. 06/27/14   Janith Lima, MD  bromocriptine (PARLODEL) 2.5 MG tablet TAKE 1 TABLET DAILY 01/15/15   Renato Shin, MD  canagliflozin (INVOKANA) 100 MG TABS tablet Take 1 tablet (100 mg total) by mouth daily. 10/22/14   Renato Shin, MD  clotrimazole (LOTRIMIN) 1 % cream  06/06/14   Historical Provider, MD  felodipine (PLENDIL) 5 MG 24 hr tablet TAKE 1 TABLET DAILY EVERY MORNING 01/27/15   Janith Lima, MD  fluticasone [facility]) 50 MCG/ACT nasal spray USE 2 SPRAYS NASALLY DAILY 04/24/14   Janith Lima, MD  HYDROcodone-acetaminophen [facility]) 10-325 MG per tablet Take 1 tablet by mouth every 6 (six) hours as needed.    Wallene Huh, DPM  IODOSORB 0.9 % gel  06/20/14   Historical Provider, MD  L-Methylfolate-Algae-B12-B6 [facility]) 3-90.314-2-35 MG CAPS TAKE 1 CAPSULE DAILY 07/31/14   Janith Lima, MD  levofloxacin (LEVAQUIN) 500 MG tablet Take 1 tablet (500 mg total) by mouth daily. 02/16/15   Ezequiel Essex, MD  losartan (COZAAR) 50 MG tablet Take 1 tablet (50 mg total) by mouth 2 (two) times daily. 10/21/14   Janith Lima, MD  metFORMIN (GLUCOPHAGE) 1000 MG tablet Take 1 tablet (1,000 mg  total) by mouth daily. 01/21/15   Renato Shin, MD  montelukast (SINGULAIR) 10 MG tablet TAKE 1 TABLET AT BEDTIME 01/13/15   Janith Lima, MD  mupirocin ointment Dr[facility] Stager) 2 %  06/13/14   Historical Provider, MD  pregabalin (LYRICA) 75 MG capsule Take 1 capsule (75 mg total) by mouth 2 (two) times daily. 02/04/15   Janith Lima, MD  SANTYL ointment  07/09/14  Historical Provider, MD  sitaGLIPtin (JANUVIA) 100 MG tablet TAKE 1 TABLET DAILY 03/22/14   Renato Shin, MD  sulfamethoxazole-trimethoprim (BACTRIM DS) 800-160 MG per tablet  06/03/14   Historical Provider, MD  tamsulosin (FLOMAX) 0.4 MG CAPS capsule TAKE 1 CAPSULE TWICE A DAY 11/17/14   Janith Lima, MD  warfarin (COUMADIN) 6 MG tablet TAKE AS DIRECTED BY COUMADIN CLINIC 02/13/15   Janith Lima, MD   BP 169/76 mmHg  Pulse 60  Temp(Src) 98.3 F (36.8 C) (Oral)  Resp 16  Ht 5\' 11"  (1.803 m)  Wt 208 lb (94.348 kg)  BMI 29.02 kg/m2  SpO2 96% Physical Exam  Constitutional: He is oriented to person, place, and time. He appears well-developed and well-nourished. No distress.  HENT:  Head: Normocephalic and atraumatic.  Mouth/Throat: Oropharynx is clear and moist. No oropharyngeal exudate.  Eyes: EOM are normal. Pupils are equal, round, and reactive to light. Right eye exhibits discharge. Left eye exhibits discharge. Right conjunctiva is injected.  Bilateral conjunctivitis with purulent drainage R>L No fluoroscein uptake. No abrasions  Neck: Normal range of motion. Neck supple.  No meningismus.  Cardiovascular: Normal rate, normal heart sounds and intact distal pulses.   No murmur heard. Occasional extra beat  Pulmonary/Chest: Effort normal and breath sounds normal. No respiratory distress.  Abdominal: Soft. There is no tenderness. There is no rebound and no guarding.  Musculoskeletal: Normal range of motion. He exhibits no edema or tenderness.  Neurological: He is alert and oriented to person, place, and time. No cranial nerve  deficit. He exhibits normal muscle tone. Coordination normal.  No ataxia on finger to nose bilaterally. No pronator drift. 5/5 strength throughout. CN 2-12 intact. Negative Romberg. Equal grip strength. Sensation intact. Gait is normal.   Skin: Skin is warm.  Psychiatric: He has a normal mood and affect. His behavior is normal.  Nursing note and vitals reviewed.   ED Course  Procedures (including critical care time) Labs Review Labs Reviewed  CBC - Abnormal; Notable for the following:    WBC 11.2 (*)    Platelets 136 (*)    All other components within normal limits  BASIC METABOLIC PANEL - Abnormal; Notable for the following:    Glucose, Bld 172 (*)    BUN 22 (*)    Creatinine, Ser 1.60 (*)    GFR calc non Af Amer 40 (*)    GFR calc Af Amer 46 (*)    All other components within normal limits  PROTIME-INR - Abnormal; Notable for the following:    Prothrombin Time 26.4 (*)    INR 2.40 (*)    All other components within normal limits  BRAIN NATRIURETIC PEPTIDE - Abnormal; Notable for the following:    B Natriuretic Peptide 383.9 (*)    All other components within normal limits  I-STAT TROPOININ, ED    Imaging Review Dg Chest 2 View  02/16/2015   CLINICAL DATA:  Shortness of breath and congestion  EXAM: CHEST  2 VIEW  COMPARISON:  08/23/2014  FINDINGS: Cardiac shadow is within normal limits. The lungs are well aerated bilaterally. New left basilar infiltrate is noted projecting in the lingula on the lateral projection. No acute bony abnormality is seen.  IMPRESSION: Left basilar infiltrate.   Electronically Signed   By: Inez Catalina M.D.   On: 02/16/2015 15:21     EKG Interpretation   Date/Time:  Sunday Feb 16 2015 13:27:14 EDT Ventricular Rate:  84 PR Interval:  264 QRS Duration:  100 QT Interval:  398 QTC Calculation: 470 R Axis:   -51 Text Interpretation:  Sinus rhythm with sinus arrhythmia with 1st degree  A-V block with occasional Premature ventricular complexes Left  anterior  fascicular block Left ventricular hypertrophy with repolarization  abnormality Anterior infarct , age undetermined Abnormal ECG No  significant change was found Confirmed by Wyvonnia Dusky  MD, [place] [phone]) on  02/16/2015 3:10:02 PM      MDM   Final diagnoses:  CAP (community acquired pneumonia)  Bilateral conjunctivitis   conjunctivitis with congestion, cough, sinus pressure. Intermittent shortness of breath. No chest pain or fever. No visual changes.  EKG is unchanged. Shows sinus rhythm with first-degree AV block, occasional PVCs. Similar to previous. Cr mildly elevated similar to previous.  Ambulatory without desaturation. Denies chest pain. INR 2.4 I offered observation admission which he declines. He is anxious to go home. We'll treat pneumonia. Cautioned patient that any antibiotic may increase his INR and it will need to be monitored closely.   Patient will be started on levaquin as it appears to be the most appropriate antibiotic for his condition.  He is cautioned that this may affect his INR and will need to be monitored closely. He has tobramycin eyedrops prescribed by urgent care.  Patient declines admission and wishes to go home. He is not in respiratory distress and not requiring any oxygen. He agrees to follow up closely with his PCP this week. Return precautions discussed.  Ezequiel Essex, MD 02/17/15 [phone]

## 2015-02-18 ENCOUNTER — Ambulatory Visit (INDEPENDENT_AMBULATORY_CARE_PROVIDER_SITE_OTHER): Payer: Medicare Other

## 2015-02-18 ENCOUNTER — Telehealth: Payer: Self-pay | Admitting: Surgery

## 2015-02-18 DIAGNOSIS — Z7901 Long term (current) use of anticoagulants: Secondary | ICD-10-CM | POA: Diagnosis not present

## 2015-02-18 DIAGNOSIS — Z5181 Encounter for therapeutic drug level monitoring: Secondary | ICD-10-CM | POA: Diagnosis not present

## 2015-02-18 LAB — POCT INR: INR: 2.7

## 2015-02-19 ENCOUNTER — Telehealth: Payer: Self-pay | Admitting: Endocrinology

## 2015-02-19 NOTE — Telephone Encounter (Signed)
Some how a lot of confusion with his diabetic shoes He needs the second page signed and sent to Dr. Geanie Berlin office  Patient states if needing an appointment please advise he will come in    Call back; [phone]   Thank you

## 2015-02-19 NOTE — Telephone Encounter (Signed)
I contacted the patient and advised that Dr. [name] would not sign off on the second page due to it requesting him to sign off on another MD's note. Pt voiced understanding. He stated he would contact Dr. Mellody Drown office.

## 2015-02-21 ENCOUNTER — Ambulatory Visit (INDEPENDENT_AMBULATORY_CARE_PROVIDER_SITE_OTHER): Payer: Medicare Other | Admitting: *Deleted

## 2015-02-21 DIAGNOSIS — Z5181 Encounter for therapeutic drug level monitoring: Secondary | ICD-10-CM | POA: Diagnosis not present

## 2015-02-21 DIAGNOSIS — Z7901 Long term (current) use of anticoagulants: Secondary | ICD-10-CM

## 2015-02-21 LAB — POCT INR: INR: 2.5

## 2015-02-25 ENCOUNTER — Telehealth: Payer: Self-pay | Admitting: Internal Medicine

## 2015-02-25 ENCOUNTER — Encounter: Payer: Self-pay | Admitting: Internal Medicine

## 2015-02-25 ENCOUNTER — Ambulatory Visit (INDEPENDENT_AMBULATORY_CARE_PROVIDER_SITE_OTHER)
Admission: RE | Admit: 2015-02-25 | Discharge: 2015-02-25 | Disposition: A | Discharge status: Home / Self Care | Payer: Medicare Other | Source: Ambulatory Visit | Attending: Internal Medicine | Admitting: Internal Medicine

## 2015-02-25 ENCOUNTER — Ambulatory Visit (INDEPENDENT_AMBULATORY_CARE_PROVIDER_SITE_OTHER): Payer: Medicare Other | Admitting: Internal Medicine

## 2015-02-25 VITALS — BP 108/68 | HR 65 | Temp 97.8°F | Resp 16 | Ht 71.0 in | Wt 210.0 lb

## 2015-02-25 DIAGNOSIS — Z23 Encounter for immunization: Secondary | ICD-10-CM | POA: Diagnosis not present

## 2015-02-25 DIAGNOSIS — H109 Unspecified conjunctivitis: Secondary | ICD-10-CM | POA: Diagnosis not present

## 2015-02-25 DIAGNOSIS — I517 Cardiomegaly: Secondary | ICD-10-CM | POA: Diagnosis not present

## 2015-02-25 DIAGNOSIS — J189 Pneumonia, unspecified organism: Secondary | ICD-10-CM

## 2015-02-25 DIAGNOSIS — Z951 Presence of aortocoronary bypass graft: Secondary | ICD-10-CM | POA: Diagnosis not present

## 2015-02-25 MED ORDER — TOBRAMYCIN-DEXAMETHASONE 0.3-0.1 % OP SUSP: 2.0000 [drp] | OPHTHALMIC | Status: DC

## 2015-02-25 NOTE — Patient Instructions (Signed)
Allergic Conjunctivitis  The conjunctiva is a thin membrane that covers the visible white part of the eyeball and the underside of the eyelids. This membrane protects and lubricates the eye. The membrane has small blood vessels running through it that can normally be seen. When the conjunctiva becomes inflamed, the condition is called conjunctivitis. In response to the inflammation, the conjunctival blood vessels become swollen. The swelling results in redness in the normally white part of the eye.  The blood vessels of this membrane also react when a person has allergies and is then called allergic conjunctivitis. This condition usually lasts for as long as the allergy persists. Allergic conjunctivitis cannot be passed to another person (non-contagious). The likelihood of bacterial infection is great and the cause is not likely due to allergies if the inflamed eye has:  · A sticky discharge.  · Discharge or sticking together of the lids in the morning.  · Scaling or flaking of the eyelids where the eyelashes come out.  · Red swollen eyelids.  CAUSES   · Viruses.  · Irritants such as foreign bodies.  · Chemicals.  · General allergic reactions.  · Inflammation or serious diseases in the inside or the outside of the eye or the orbit (the boney cavity in which the eye sits) can cause a "red eye."  SYMPTOMS   · Eye redness.  · Tearing.  · Itchy eyes.  · Burning feeling in the eyes.  · Clear drainage from the eye.  · Allergic reaction due to pollens or ragweed sensitivity. Seasonal allergic conjunctivitis is frequent in the spring when pollens are in the air and in the fall.  DIAGNOSIS   This condition, in its many forms, is usually diagnosed based on the history and an ophthalmological exam. It usually involves both eyes. If your eyes react at the same time every year, allergies may be the cause. While most "red eyes" are due to allergy or an infection, the role of an eye (ophthalmological) exam is important. The exam  can rule out serious diseases of the eye or orbit.  TREATMENT   · Non-antibiotic eye drops, ointments, or medications by mouth may be prescribed if the ophthalmologist is sure the conjunctivitis is due to allergies alone.  · Over-the-counter drops and ointments for allergic symptoms should be used only after other causes of conjunctivitis have been ruled out, or as your caregiver suggests.  Medications by mouth are often prescribed if other allergy-related symptoms are present. If the ophthalmologist is sure that the conjunctivitis is due to allergies alone, treatment is normally limited to drops or ointments to reduce itching and burning.  HOME CARE INSTRUCTIONS   · Wash hands before and after applying drops or ointments, or touching the inflamed eye(s) or eyelids.  · Do not let the eye dropper tip or ointment tube touch the eyelid when putting medicine in your eye.  · Stop using your soft contact lenses and throw them away. Use a new pair of lenses when recovery is complete. You should run through sterilizing cycles at least three times before use after complete recovery if the old soft contact lenses are to be used. Hard contact lenses should be stopped. They need to be thoroughly sterilized before use after recovery.  · Itching and burning eyes due to allergies is often relieved by using a cool cloth applied to closed eye(s).  SEEK MEDICAL CARE IF:   · Your problems do not go away after two or three days of treatment.  ·   Your lids are sticky (especially in the morning when you wake up) or stick together.  · Discharge develops. Antibiotics may be needed either as drops, ointment, or by mouth.  · You have extreme light sensitivity.  · An oral temperature above 102° F (38.9° C) develops.  · Pain in or around the eye or any other visual symptom develops.  MAKE SURE YOU:   · Understand these instructions.  · Will watch your condition.  · Will get help right away if you are not doing well or get worse.  Document  Released: 12/18/2002 Document Revised: 12/20/2011 Document Reviewed: 11/13/2007  [employer]® Patient Information ©2015 [employer]. This information is not intended to replace advice given to you by your health care provider. Make sure you discuss any questions you have with your health care provider.

## 2015-02-25 NOTE — Telephone Encounter (Signed)
Rx resent to local [facility] and [facility] contacted [phone]) to cancel Rx for eye solution. Per Darren, they were able to stop the Rx from processing, pt notified.

## 2015-02-25 NOTE — Telephone Encounter (Signed)
Patient wanted eye solution to be sent to [facility] on [address] and NOT [facility]. Please cancel [facility] order and send to [facility]. Please let patient know when done.

## 2015-02-25 NOTE — Progress Notes (Signed)
Pre visit review using our clinic review tool, if applicable. No additional management support is needed unless otherwise documented below in the visit note. 

## 2015-02-26 ENCOUNTER — Encounter: Payer: Self-pay | Admitting: Internal Medicine

## 2015-02-26 MED ORDER — TOBRAMYCIN-DEXAMETHASONE 0.3-0.1 % OP SUSP: 2.0000 [drp] | OPHTHALMIC | Status: DC

## 2015-02-26 NOTE — Progress Notes (Signed)
Subjective:  Patient ID: Ralph Gray, male    DOB: 12/16/1934  Age: 80 y.o. MRN: [id number]  CC: Pneumonia and Conjunctivitis   HPI Ralph Gray presents for follow up on PNA - the cough has resolved but he complains of persistent itchy, scratchy, red eyes. The cough has resolved.  Outpatient Prescriptions Prior to Visit  Medication Sig Dispense Refill  . aspirin 81 MG tablet Take 81 mg by mouth daily.      Marland Kitchen atorvastatin (LIPITOR) 10 MG tablet Take 1 tablet (10 mg total) by mouth every evening. 90 tablet 3  . bromocriptine (PARLODEL) 2.5 MG tablet TAKE 1 TABLET DAILY 90 tablet 1  . canagliflozin (INVOKANA) 100 MG TABS tablet Take 1 tablet (100 mg total) by mouth daily. 90 tablet 3  . clotrimazole (LOTRIMIN) 1 % cream   2  . felodipine (PLENDIL) 5 MG 24 hr tablet TAKE 1 TABLET DAILY EVERY MORNING 90 tablet 3  . fluticasone (FLONASE) 50 MCG/ACT nasal spray USE 2 SPRAYS NASALLY DAILY 48 g 3  . IODOSORB 0.9 % gel   1  . L-Methylfolate-Algae-B12-B6 (METANX) 3-90.314-2-35 MG CAPS TAKE 1 CAPSULE DAILY 90 capsule 1  . losartan (COZAAR) 50 MG tablet Take 1 tablet (50 mg total) by mouth 2 (two) times daily. 180 tablet 3  . metFORMIN (GLUCOPHAGE) 1000 MG tablet Take 1 tablet (1,000 mg total) by mouth daily. 90 tablet 3  . montelukast (SINGULAIR) 10 MG tablet TAKE 1 TABLET AT BEDTIME 90 tablet 2  . pregabalin (LYRICA) 75 MG capsule Take 1 capsule (75 mg total) by mouth 2 (two) times daily. 180 capsule 1  . SANTYL ointment   0  . sitaGLIPtin (JANUVIA) 100 MG tablet TAKE 1 TABLET DAILY 90 tablet 3  . tamsulosin (FLOMAX) 0.4 MG CAPS capsule TAKE 1 CAPSULE TWICE A DAY 180 capsule 3  . warfarin (COUMADIN) 6 MG tablet TAKE AS DIRECTED BY COUMADIN CLINIC 120 tablet 1  . HYDROcodone-acetaminophen (NORCO) 10-325 MG per tablet Take 1 tablet by mouth every 6 (six) hours as needed.    Marland Kitchen levofloxacin (LEVAQUIN) 500 MG tablet Take 1 tablet (500 mg total) by mouth daily. 7 tablet 0  . mupirocin ointment  (BACTROBAN) 2 %   6  . sulfamethoxazole-trimethoprim (BACTRIM DS) 800-160 MG per tablet   1   No facility-administered medications prior to visit.    ROS Review of Systems  Constitutional: Negative.  Negative for fever, chills, diaphoresis, appetite change and fatigue.  HENT: Negative.  Negative for facial swelling, sinus pressure and sore throat.   Eyes: Positive for redness and itching. Negative for photophobia, pain, discharge and visual disturbance.  Respiratory: Negative.  Negative for cough, choking, chest tightness, shortness of breath and stridor.   Cardiovascular: Negative.  Negative for chest pain, palpitations and leg swelling.  Gastrointestinal: Negative.  Negative for nausea, vomiting, abdominal pain, diarrhea, constipation and blood in stool.  Endocrine: Negative.  Negative for polydipsia, polyphagia and polyuria.  Genitourinary: Negative.   Musculoskeletal: Negative.   Skin: Negative.  Negative for rash.  Allergic/Immunologic: Negative.   Neurological: Negative.   Hematological: Negative for adenopathy. Does not bruise/bleed easily.  Psychiatric/Behavioral: Negative.     Objective:  BP 108/68 mmHg  Pulse 65  Temp(Src) 97.8 F (36.6 C) (Oral)  Resp 16  Ht 5\' 11"  (1.803 m)  Wt 210 lb (95.255 kg)  BMI 29.30 kg/m2  SpO2 96%  BP Readings from Last 3 Encounters:  02/25/15 108/68  02/16/15 169/76  01/21/15 118/62  Wt Readings from Last 3 Encounters:  02/25/15 210 lb (95.255 kg)  02/16/15 208 lb (94.348 kg)  01/21/15 209 lb (94.802 kg)    Physical Exam  Constitutional: He is oriented to person, place, and time. He appears well-developed and well-nourished.  Non-toxic appearance. He does not have a sickly appearance. He does not appear ill. No distress.  HENT:  Head: Normocephalic and atraumatic.  Mouth/Throat: Oropharynx is clear and moist.  Eyes: EOM and lids are normal. Pupils are equal, round, and reactive to light. Right eye exhibits no chemosis, no  discharge, no exudate and no hordeolum. No foreign body present in the right eye. Left eye exhibits no chemosis, no discharge, no exudate and no hordeolum. No foreign body present in the left eye. Right conjunctiva is injected. Right conjunctiva has no hemorrhage. Left conjunctiva is injected. Left conjunctiva has no hemorrhage. No scleral icterus. Right eye exhibits normal extraocular motion and no nystagmus. Left eye exhibits normal extraocular motion and no nystagmus. Right pupil is round and reactive. Left pupil is round and reactive. Pupils are equal.    Neck: Normal range of motion. Neck supple. No JVD present. No tracheal deviation present. No thyromegaly present.  Cardiovascular: Normal rate, regular rhythm, normal heart sounds and intact distal pulses.  Exam reveals no gallop and no friction rub.   No murmur heard. Pulmonary/Chest: Effort normal and breath sounds normal. No stridor. No respiratory distress. He has no wheezes. He has no rales. He exhibits no tenderness.  Abdominal: Soft. Bowel sounds are normal. He exhibits no distension and no mass. There is no tenderness. There is no rebound and no guarding.  Musculoskeletal: Normal range of motion. He exhibits no edema or tenderness.  Lymphadenopathy:    He has no cervical adenopathy.  Neurological: He is oriented to person, place, and time.  Skin: Skin is warm and dry. No rash noted. He is not diaphoretic. No erythema. No pallor.  Vitals reviewed.   Lab Results  Component Value Date   WBC 11.2* 02/16/2015   HGB 14.5 02/16/2015   HCT 42.3 02/16/2015   PLT 136* 02/16/2015   GLUCOSE 172* 02/16/2015   CHOL 123 07/22/2014   TRIG 146 07/22/2014   HDL 40 07/22/2014   LDLDIRECT 59.4 07/11/2013   LDLCALC 54 07/22/2014   ALT 39 01/03/2014   AST 29 01/03/2014   NA 138 02/16/2015   K 4.6 02/16/2015   CL 104 02/16/2015   CREATININE 1.60* 02/16/2015   BUN 22* 02/16/2015   CO2 23 02/16/2015   PSA 0.06 01/07/2014   INR 2.5 02/21/2015    HGBA1C 6.8* 01/21/2015   MICROALBUR 18.5* 10/22/2014    Dg Chest 2 View  02/25/2015   CLINICAL DATA:  Pneumonia.  EXAM: CHEST  2 VIEW  COMPARISON:  02/16/2015.  FINDINGS: Mediastinum hilar structures normal. Prior CABG. Cardiomegaly with normal pulmonary vascularity. Partial clearing of left base infiltrate. No pleural effusion pneumothorax. No acute bony abnormality .  IMPRESSION: Partial clearing of left base infiltrate.   Electronically Signed   By: Marcello Moores  Register   On: 02/25/2015 12:25    Assessment & Plan:   Ralph Gray was seen today for pneumonia and conjunctivitis.  Diagnoses and all orders for this visit:  Need for prophylactic vaccination against Streptococcus pneumoniae (pneumococcus) Orders: -     Pneumococcal polysaccharide vaccine 23-valent greater than or equal to 2yo subcutaneous/IM  CAP (community acquired pneumonia) Orders: -     DG Chest 2 View; Future  Bilateral conjunctivitis Orders: -  Discontinue: tobramycin-dexamethasone (TOBRADEX) ophthalmic solution; Place 2 drops into both eyes every 4 (four) hours while awake. -     tobramycin-dexamethasone (TOBRADEX) ophthalmic solution; Place 2 drops into both eyes every 4 (four) hours while awake.   I have discontinued Mr. Russom sulfamethoxazole-trimethoprim, mupirocin ointment, HYDROcodone-acetaminophen, and levofloxacin. I am also having him maintain his aspirin, sitaGLIPtin, fluticasone, atorvastatin, METANX, IODOSORB, SANTYL, clotrimazole, losartan, canagliflozin, tamsulosin, montelukast, bromocriptine, metFORMIN, felodipine, pregabalin, warfarin, and tobramycin-dexamethasone.  Meds ordered this encounter  Medications  . DISCONTD: tobramycin-dexamethasone (TOBRADEX) ophthalmic solution    Sig: Place 2 drops into both eyes every 4 (four) hours while awake.    Dispense:  5 mL    Refill:  0  . tobramycin-dexamethasone (TOBRADEX) ophthalmic solution    Sig: Place 2 drops into both eyes every 4 (four) hours  while awake.    Dispense:  5 mL    Refill:  0   Comments: based on his s/s and the CXR the PNA has resolved, he continues to be bothered by the conjunctivitis so will start tobradex ophth.  Follow-up: Return in about 2 months (around 04/27/2015).  [name], MD

## 2015-02-27 ENCOUNTER — Other Ambulatory Visit: Payer: Self-pay | Admitting: Internal Medicine

## 2015-02-27 DIAGNOSIS — J189 Pneumonia, unspecified organism: Secondary | ICD-10-CM

## 2015-02-27 MED ORDER — LEVOFLOXACIN 500 MG PO TABS: 500.0000 mg | ORAL_TABLET | Freq: Every day | ORAL | Status: AC

## 2015-03-06 ENCOUNTER — Emergency Department (HOSPITAL_COMMUNITY)
Admission: EM | Admit: 2015-03-06 | Discharge: 2015-03-06 | Disposition: A | Discharge status: Home / Self Care | Payer: Medicare Other | Attending: Emergency Medicine | Admitting: Emergency Medicine

## 2015-03-06 ENCOUNTER — Encounter: Payer: Self-pay | Admitting: Internal Medicine

## 2015-03-06 ENCOUNTER — Ambulatory Visit (INDEPENDENT_AMBULATORY_CARE_PROVIDER_SITE_OTHER): Payer: Medicare Other | Admitting: Internal Medicine

## 2015-03-06 ENCOUNTER — Encounter (HOSPITAL_COMMUNITY): Payer: Self-pay | Admitting: Emergency Medicine

## 2015-03-06 ENCOUNTER — Telehealth: Payer: Self-pay | Admitting: Cardiovascular Disease

## 2015-03-06 ENCOUNTER — Ambulatory Visit (INDEPENDENT_AMBULATORY_CARE_PROVIDER_SITE_OTHER)
Admission: RE | Admit: 2015-03-06 | Discharge: 2015-03-06 | Disposition: A | Discharge status: Home / Self Care | Payer: Medicare Other | Source: Ambulatory Visit | Attending: Internal Medicine | Admitting: Internal Medicine

## 2015-03-06 ENCOUNTER — Other Ambulatory Visit (INDEPENDENT_AMBULATORY_CARE_PROVIDER_SITE_OTHER): Payer: Medicare Other

## 2015-03-06 ENCOUNTER — Telehealth: Payer: Self-pay | Admitting: Internal Medicine

## 2015-03-06 VITALS — BP 100/50 | HR 60 | Temp 97.8°F | Resp 16 | Ht 71.0 in | Wt 211.0 lb

## 2015-03-06 DIAGNOSIS — N183 Chronic kidney disease, stage 3 unspecified: Secondary | ICD-10-CM

## 2015-03-06 DIAGNOSIS — I2510 Atherosclerotic heart disease of native coronary artery without angina pectoris: Secondary | ICD-10-CM | POA: Insufficient documentation

## 2015-03-06 DIAGNOSIS — Z7951 Long term (current) use of inhaled steroids: Secondary | ICD-10-CM | POA: Insufficient documentation

## 2015-03-06 DIAGNOSIS — Z87891 Personal history of nicotine dependence: Secondary | ICD-10-CM | POA: Insufficient documentation

## 2015-03-06 DIAGNOSIS — N189 Chronic kidney disease, unspecified: Secondary | ICD-10-CM | POA: Diagnosis not present

## 2015-03-06 DIAGNOSIS — R0600 Dyspnea, unspecified: Secondary | ICD-10-CM | POA: Diagnosis not present

## 2015-03-06 DIAGNOSIS — I4892 Unspecified atrial flutter: Secondary | ICD-10-CM

## 2015-03-06 DIAGNOSIS — I9589 Other hypotension: Secondary | ICD-10-CM | POA: Insufficient documentation

## 2015-03-06 DIAGNOSIS — I10 Essential (primary) hypertension: Secondary | ICD-10-CM | POA: Diagnosis not present

## 2015-03-06 DIAGNOSIS — Z79899 Other long term (current) drug therapy: Secondary | ICD-10-CM | POA: Insufficient documentation

## 2015-03-06 DIAGNOSIS — E785 Hyperlipidemia, unspecified: Secondary | ICD-10-CM | POA: Diagnosis not present

## 2015-03-06 DIAGNOSIS — R7989 Other specified abnormal findings of blood chemistry: Secondary | ICD-10-CM

## 2015-03-06 DIAGNOSIS — I252 Old myocardial infarction: Secondary | ICD-10-CM | POA: Diagnosis not present

## 2015-03-06 DIAGNOSIS — E119 Type 2 diabetes mellitus without complications: Secondary | ICD-10-CM | POA: Insufficient documentation

## 2015-03-06 DIAGNOSIS — Z8669 Personal history of other diseases of the nervous system and sense organs: Secondary | ICD-10-CM | POA: Diagnosis not present

## 2015-03-06 DIAGNOSIS — I4891 Unspecified atrial fibrillation: Secondary | ICD-10-CM

## 2015-03-06 DIAGNOSIS — J189 Pneumonia, unspecified organism: Secondary | ICD-10-CM

## 2015-03-06 DIAGNOSIS — Z8551 Personal history of malignant neoplasm of bladder: Secondary | ICD-10-CM | POA: Insufficient documentation

## 2015-03-06 DIAGNOSIS — R5383 Other fatigue: Secondary | ICD-10-CM | POA: Insufficient documentation

## 2015-03-06 DIAGNOSIS — Z880 Allergy status to penicillin: Secondary | ICD-10-CM | POA: Insufficient documentation

## 2015-03-06 DIAGNOSIS — R0602 Shortness of breath: Secondary | ICD-10-CM | POA: Diagnosis not present

## 2015-03-06 DIAGNOSIS — E1151 Type 2 diabetes mellitus with diabetic peripheral angiopathy without gangrene: Secondary | ICD-10-CM

## 2015-03-06 DIAGNOSIS — Z8546 Personal history of malignant neoplasm of prostate: Secondary | ICD-10-CM | POA: Insufficient documentation

## 2015-03-06 DIAGNOSIS — C9110 Chronic lymphocytic leukemia of B-cell type not having achieved remission: Secondary | ICD-10-CM | POA: Diagnosis not present

## 2015-03-06 DIAGNOSIS — Z5181 Encounter for therapeutic drug level monitoring: Secondary | ICD-10-CM | POA: Diagnosis not present

## 2015-03-06 DIAGNOSIS — I480 Paroxysmal atrial fibrillation: Secondary | ICD-10-CM | POA: Diagnosis not present

## 2015-03-06 DIAGNOSIS — I517 Cardiomegaly: Secondary | ICD-10-CM | POA: Diagnosis not present

## 2015-03-06 DIAGNOSIS — Z7982 Long term (current) use of aspirin: Secondary | ICD-10-CM | POA: Insufficient documentation

## 2015-03-06 LAB — BASIC METABOLIC PANEL
BUN: 24 mg/dL — ABNORMAL HIGH (ref 6–23)
CO2: 25 meq/L (ref 19–32)
Calcium: 9.2 mg/dL (ref 8.4–10.5)
Chloride: 106 mEq/L (ref 96–112)
Creatinine, Ser: 1.42 mg/dL (ref 0.40–1.50)
GFR: 51.28 mL/min — ABNORMAL LOW (ref >60.00)
Glucose, Bld: 158 mg/dL — ABNORMAL HIGH (ref 70–99)
POTASSIUM: 4.8 meq/L (ref 3.5–5.1)
Sodium: 138 mEq/L (ref 135–145)

## 2015-03-06 LAB — CARDIAC PANEL
CK-MB: 2.3 ng/mL (ref 0.3–4.0)
Relative Index: 4.3 calc — ABNORMAL HIGH (ref 0.0–2.5)
Total CK: 54 U/L (ref 7–232)

## 2015-03-06 LAB — URINALYSIS, ROUTINE W REFLEX MICROSCOPIC
BILIRUBIN URINE: NEGATIVE
Hgb urine dipstick: NEGATIVE
Ketones, ur: NEGATIVE
LEUKOCYTES UA: NEGATIVE
NITRITE: NEGATIVE
Specific Gravity, Urine: 1.015 (ref 1.000–1.030)
TOTAL PROTEIN, URINE-UPE24: NEGATIVE
Urine Glucose: >=1000 — AB
Urobilinogen, UA: 0.2 (ref 0.0–1.0)
pH: 6 (ref 5.0–8.0)

## 2015-03-06 LAB — CBC WITH DIFFERENTIAL/PLATELET
BASOS PCT: 0.4 % (ref 0.0–3.0)
Basophils Absolute: 0 10*3/uL (ref 0.0–0.1)
EOS ABS: 0.2 10*3/uL (ref 0.0–0.7)
EOS PCT: 1.8 % (ref 0.0–5.0)
HCT: 42.8 % (ref 39.0–52.0)
Hemoglobin: 14.5 g/dL (ref 13.0–17.0)
Lymphocytes Relative: 45 % (ref 12.0–46.0)
Lymphs Abs: 4.3 10*3/uL — ABNORMAL HIGH (ref 0.7–4.0)
MCHC: 33.8 g/dL (ref 30.0–36.0)
MCV: 89.6 fl (ref 78.0–100.0)
Monocytes Absolute: 0.7 10*3/uL (ref 0.1–1.0)
Monocytes Relative: 7.3 % (ref 3.0–12.0)
NEUTROS ABS: 4.4 10*3/uL (ref 1.4–7.7)
NEUTROS PCT: 45.5 % (ref 43.0–77.0)
PLATELETS: 183 10*3/uL (ref 150.0–400.0)
RBC: 4.78 Mil/uL (ref 4.22–5.81)
RDW: 14.4 % (ref 11.5–15.5)
WBC: 9.7 10*3/uL (ref 4.0–10.5)

## 2015-03-06 LAB — I-STAT TROPONIN, ED: TROPONIN I, POC: 0.01 ng/mL (ref 0.00–0.08)

## 2015-03-06 LAB — TROPONIN I: TNIDX: 0.02 ug/L (ref 0.00–0.06)

## 2015-03-06 LAB — PROTIME-INR
INR: 2.9 ratio — AB (ref 0.8–1.0)
Prothrombin Time: 30.8 s — ABNORMAL HIGH (ref 9.6–13.1)

## 2015-03-06 LAB — TSH: TSH: 1.15 u[IU]/mL (ref 0.35–4.50)

## 2015-03-06 LAB — CBG MONITORING, ED: Glucose-Capillary: 119 mg/dL — ABNORMAL HIGH (ref 65–99)

## 2015-03-06 LAB — BRAIN NATRIURETIC PEPTIDE: Pro B Natriuretic peptide (BNP): 435 pg/mL — ABNORMAL HIGH (ref 0.0–100.0)

## 2015-03-06 MED ORDER — METFORMIN HCL 500 MG PO TABS
1000.0000 mg | ORAL_TABLET | Freq: Once | ORAL | Status: DC
  Filled 2015-03-06: qty 2

## 2015-03-06 NOTE — Telephone Encounter (Signed)
Patient is verbalizing that over the past several days he has been experiencing increased levels of fatigue and weakness with moderate SOB. Denies cough, swelling or wt gain. Patient recently recovering from PNA. Seen by PCP, Dr. Ronnald Ramp, today at [facility]. Dr. Ronnald Ramp reviewed lab work and found that BNP elevated (435). BP 100/50, HR 60 regular rhythm. Dr. Ronnald Ramp advised patient to call cardiologist to see if he needed to be seen in office today or referred to ED. Patient was taken off Losartan d/t hypotension. Dr. Radford Pax reviewed Dr. Ronnald Ramp notes, patient's sx and lab work. Patient advised to proceed to ED for cardiac workup (probable diuresing which is best not done at home since hypotensive and has detailed cardiac hx). Patient notified and he verbalized understanding and agreement to proceed to ED at this time [facility]). Wife is with him and ED charge nurse has been notified that he is on his way at this time.

## 2015-03-06 NOTE — ED Provider Notes (Signed)
CSN: [id number]     Arrival date & time 03/06/15  1440 History   First MD Initiated Contact with Patient 03/06/15 1808     Chief Complaint  Patient presents with  . Fatigue  . Shortness of Breath     (Consider location/radiation/quality/duration/timing/severity/associated sxs/prior Treatment) HPI The patient was seen at his family doctor's office today for follow-up of a recent pneumonia. He has finished his antibiotics but continues to have some fatigue and shortness of breath. Outpatient labs have been ordered and he had an elevated BNP at 435, he was contacted and advised to come to the emergency department for evaluation. The other concern was that in the office he had a blood pressure that was low (100/50). The patient initial treatment for pneumonia started about 2 weeks ago. He was treated outpatient and has started to have some improvement. He then followed up at his primary care doctor's office and by his description had a pneumonia vaccine. Within about 2-3 days he was feeling worse again and extremely fatigued with little energy for usual activities and decreased appetite. He was doing follow-up today. He has not had chest pain or recurrence of any fever. The amount of fatigue that he had been feeling several days ago has improved but he does still does not feel back to baseline. He states he has had weight loss throughout all of this and is still down by about 8 pounds. He reports he chronically has edema in his lower legs and he does not have anymore than he normally would. Past Medical History  Diagnosis Date  . Hypertension   . Hyperlipidemia   . Peripheral neuropathy   . Macular degeneration BILATERAL    TX--- intra-orbital injections. HX  laser treatment  . Type II diabetes mellitus   . Bilateral carotid artery stenosis     PER DUPLEX 08-07-2013--  BILATERAL ICA  40-59% STENOSIS  . Anticoagulated on Coumadin   . History of prostate cancer     DX  2010--  S/P EXTERNAL  RADIATION THERAPY  . Bladder tumor   . PAF (paroxysmal atrial fibrillation)     DX  2006  . First degree heart block   . Coronary artery disease CARDIOLOGIST --  DR Burt Knack    MI '99   S/P CABG  . History of MI (myocardial infarction)     1999  --  S/P CABG  . S/P CABG x 4     1999  IN [place]  . Cataract of both eyes     RESCHEDULED FOR EXTRACTIONS  MAY 2015  . Renal insufficiency   . Personal history of CLL (chronic lymphocytic leukemia)     PER LAB RESULTS IN 2010--  NO TX  ONLY BEING MONITORED  . History of hepatitis C virus infection     1990's  per lab results  --  asymptomatic ,  no tx  . Wears glasses   . At risk for sleep apnea     STOP-BANG=  5   //SENT TO PCP  02-06-2014  . Nocturia    Past Surgical History  Procedure Laterality Date  . Cardioversion  2008  . Cardiac catheterization  08-29-2012   DR COOPER    POST CABG--  PATENT LIMA to  LAD,  free RIMA  to acute marginal branch of the RCA,  &  SVG to DIAGONAL/  TOTAL OCCLUSION OF SVG to distal RCA/  MODERATE SEGMENTAL LVSF/  80% STENOSIS  pCFX (which was not grafted)  .  Transthoracic echocardiogram  08-01-2012  dr cooper    GRADE I DIASTOLIC DYSFUNCTION/  EF 55-60%/  MODERATE LAE/  AORTIC SCLEROSIS WITHOUT STENOSIS/  MILD TR  . Coronary artery bypass graft  1999    CABG X4;  in [place]  . Transurethral resection of bladder tumor N/A 02/08/2014    Procedure: TRANSURETHRAL RESECTION OF BLADDER TUMOR (TURBT) WITH GYRUS ;  Surgeon: [name], MD;  Location: [facility];  Service: Urology;  Laterality: N/A;   Family History  Problem Relation Age of Onset  . Heart disease Mother 40    died  . Breast cancer Mother     sarcoma  . Heart attack Father 76    died  . Heart disease Father     MI  . Diabetes Neg Hx   . COPD Neg Hx    History  Substance Use Topics  . Smoking status: Former Smoker -- 2.50 packs/day for 9 years    Types: Cigarettes    Quit date: 10/14/1963  . Smokeless  tobacco: Never Used  . Alcohol Use: No    Review of Systems 10 Systems reviewed and are negative for acute change except as noted in the HPI.    Allergies  Amoxicillin and Other  Home Medications   Prior to Admission medications   Medication Sig Start Date End Date Taking? Authorizing Provider  aspirin 81 MG tablet Take 81 mg by mouth daily.      Historical Provider, MD  atorvastatin (LIPITOR) 10 MG tablet Take 1 tablet (10 mg total) by mouth every evening. 06/27/14   Janith Lima, MD  bromocriptine (PARLODEL) 2.5 MG tablet TAKE 1 TABLET DAILY 01/15/15   Renato Shin, MD  canagliflozin (INVOKANA) 100 MG TABS tablet Take 1 tablet (100 mg total) by mouth daily. 10/22/14   Renato Shin, MD  clotrimazole (LOTRIMIN) 1 % cream  06/06/14   Historical Provider, MD  felodipine (PLENDIL) 5 MG 24 hr tablet TAKE 1 TABLET DAILY EVERY MORNING 01/27/15   Janith Lima, MD  fluticasone [facility]) 50 MCG/ACT nasal spray USE 2 SPRAYS NASALLY DAILY 04/24/14   Janith Lima, MD  IODOSORB 0.9 % gel  06/20/14   Historical Provider, MD  L-Methylfolate-Algae-B12-B6 [facility]) 3-90.314-2-35 MG CAPS TAKE 1 CAPSULE DAILY 07/31/14   Janith Lima, MD  metFORMIN (GLUCOPHAGE) 1000 MG tablet Take 1 tablet (1,000 mg total) by mouth daily. 01/21/15   Renato Shin, MD  montelukast (SINGULAIR) 10 MG tablet TAKE 1 TABLET AT BEDTIME 01/13/15   Janith Lima, MD  pregabalin (LYRICA) 75 MG capsule Take 1 capsule (75 mg total) by mouth 2 (two) times daily. 02/04/15   Janith Lima, MD  SANTYL ointment  07/09/14   Historical Provider, MD  sitaGLIPtin (JANUVIA) 100 MG tablet TAKE 1 TABLET DAILY 03/22/14   Renato Shin, MD  tamsulosin (FLOMAX) 0.4 MG CAPS capsule TAKE 1 CAPSULE TWICE A DAY 11/17/14   Janith Lima, MD  warfarin (COUMADIN) 6 MG tablet TAKE AS DIRECTED BY COUMADIN CLINIC 02/13/15   Janith Lima, MD   BP 156/75 mmHg  Pulse 57  Temp(Src) 98.7 F (37.1 C) (Oral)  Resp 19  Ht 5\' 11"  (1.803 m)  Wt 211 lb (95.709 kg)   BMI 29.44 kg/m2  SpO2 98% Physical Exam  Constitutional: He is oriented to person, place, and time. He appears well-developed and well-nourished.  The patient is moderately obese and slightly deconditioned but appears to be well today. He is  nontoxic and alert with clear mental status and no respiratory distress.  HENT:  Head: Normocephalic and atraumatic.  Mouth/Throat: Oropharynx is clear and moist.  Eyes: EOM are normal. Pupils are equal, round, and reactive to light.  Neck: Neck supple.  Cardiovascular: Normal rate, normal heart sounds and intact distal pulses.   Occasional ectopy.  Pulmonary/Chest: Effort normal and breath sounds normal. No respiratory distress.  Abdominal: Soft. Bowel sounds are normal. He exhibits no distension. There is no tenderness.  Musculoskeletal: Normal range of motion. He exhibits edema. He exhibits no tenderness.  Patient has 1+ pitting edema symmetrically bilateral lower extremities. The calves are soft and nontender. Patient has skin thinning consistent with chronic venous stasis but no ulcers or appearance of cellulitis.  Neurological: He is alert and oriented to person, place, and time. He has normal strength. Coordination normal. GCS eye subscore is 4. GCS verbal subscore is 5. GCS motor subscore is 6.  Skin: Skin is warm, dry and intact.  Psychiatric: He has a normal mood and affect.    ED Course  Procedures (including critical care time) Labs Review Labs Reviewed  CBG MONITORING, ED - Abnormal; Notable for the following:    Glucose-Capillary 119 (*)    All other components within normal limits  I-STAT TROPOININ, ED    Imaging Review Dg Chest 2 View  03/06/2015   CLINICAL DATA:  Pneumonia.  EXAM: CHEST  2 VIEW  COMPARISON:  02/25/2015, 02/16/2015, 08/23/2012.  FINDINGS: Mediastinum and hilar structures are normal. Prior CABG. Cardiomegaly with normal pulmonary vascularity. Interval near complete clearing of left lower lobe infiltrate. Remaining  increased markings in the left lung base are most likely chronic related to pleural parenchymal scarring. No acute new infiltrate noted. No pleural effusion or pneumothorax. No acute bony abnormality.  IMPRESSION: Interval near complete clearing of left lower lobe infiltrate. Persistent mild increased markings in the left lung base most likely related to pleural parenchymal scarring .   Electronically Signed   By: Marcello Moores  Register   On: 03/06/2015 12:10     EKG Interpretation   Date/Time:  Thursday Mar 06 2015 14:55:24 EDT Ventricular Rate:  60 PR Interval:  272 QRS Duration: 100 QT Interval:  442 QTC Calculation: 442 R Axis:   -54 Text Interpretation:  Sinus rhythm with 1st degree A-V block Left anterior  fascicular block Moderate voltage criteria for LVH, may be normal variant  Anterior infarct , age undetermined Abnormal ECG no STEMI. no sig change  from prior. Confirmed by Johnney Killian, MD, Jeannie Done [phone]) on 03/06/2015 6:11:46  PM     Cardiology was made aware of the patient and will contact for follow-up tomorrow. MDM   Final diagnoses:  Other fatigue  Other specified hypotension  Elevated brain natriuretic peptide (BNP) level   At this time the patient does not show signs of being in any decompensated congestive heart failure. He has not been having chest pain, he does not have any increase in lower extremity swelling beyond baseline, the chest x-ray does not show vascular congestion. His general appearance is good. He does not show signs of being septic or of an advancing pneumonia. His blood pressures in the emergency department have been moderately elevated without any hypotension. Possibly the patient was at the maximal point of his morning blood pressure medication when he was seen in the office. He was advised not to take his evening medication by his family doctor however at this time based on blood pressure seen in the emergency  department I suspect the patient will be  significantly hypertensive by morning without any blood pressure medication thus he will take a half doses evening and check his pressures in the morning. Cardiology will see him tomorrow for further assessment of his mildly elevated BNP and the patient is well aware of need to return should his symptoms worsen or change.    Charlesetta Shanks, MD 03/06/15 [phone]

## 2015-03-06 NOTE — Telephone Encounter (Signed)
thx

## 2015-03-06 NOTE — ED Notes (Signed)
Pt sent by cardiology d/t slightly elevated BNP (435). Pt reports increased fatigue, sob, head pressure. Denies cp at this time. Skin warm and dry.

## 2015-03-06 NOTE — ED Notes (Signed)
Carb-modified tray ordered for patient. 

## 2015-03-06 NOTE — Discharge Instructions (Signed)
Information for Heart Failure Heart failure is a condition in which the heart has trouble pumping blood. This means your heart does not pump blood efficiently for your body to work well. In some cases of heart failure, fluid may back up into your lungs or you may have swelling (edema) in your lower legs. Heart failure is usually a long-term (chronic) condition. It is important for you to take good care of yourself and follow your health care provider's treatment plan. CAUSES  Some health conditions can cause heart failure. Those health conditions include:  High blood pressure (hypertension). Hypertension causes the heart muscle to work harder than normal. When pressure in the blood vessels is high, the heart needs to pump (contract) with more force in order to circulate blood throughout the body. High blood pressure eventually causes the heart to become stiff and weak.  Coronary artery disease (CAD). CAD is the buildup of cholesterol and fat (plaque) in the arteries of the heart. The blockage in the arteries deprives the heart muscle of oxygen and blood. This can cause chest pain and may lead to a heart attack. High blood pressure can also contribute to CAD.  Heart attack (myocardial infarction). A heart attack occurs when one or more arteries in the heart become blocked. The loss of oxygen damages the muscle tissue of the heart. When this happens, part of the heart muscle dies. The injured tissue does not contract as well and weakens the heart's ability to pump blood.  Abnormal heart valves. When the heart valves do not open and close properly, it can cause heart failure. This makes the heart muscle pump harder to keep the blood flowing.  Heart muscle disease (cardiomyopathy or myocarditis). Heart muscle disease is damage to the heart muscle from a variety of causes. These can include drug or alcohol abuse, infections, or unknown reasons. These can increase the risk of heart failure.  Lung disease.  Lung disease makes the heart work harder because the lungs do not work properly. This can cause a strain on the heart, leading it to fail.  Diabetes. Diabetes increases the risk of heart failure. High blood sugar contributes to high fat (lipid) levels in the blood. Diabetes can also cause slow damage to tiny blood vessels that carry important nutrients to the heart muscle. When the heart does not get enough oxygen and food, it can cause the heart to become weak and stiff. This leads to a heart that does not contract efficiently.  Other conditions can contribute to heart failure. These include abnormal heart rhythms, thyroid problems, and low blood counts (anemia). Certain unhealthy behaviors can increase the risk of heart failure, including:  Being overweight.  Smoking or chewing tobacco.  Eating foods high in fat and cholesterol.  Abusing illicit drugs or alcohol.  Lacking physical activity. SYMPTOMS  Heart failure symptoms may vary and can be hard to detect. Symptoms may include:  Shortness of breath with activity, such as climbing stairs.  Persistent cough.  Swelling of the feet, ankles, legs, or abdomen.  Unexplained weight gain.  Difficulty breathing when lying flat (orthopnea).  Waking from sleep because of the need to sit up and get more air.  Rapid heartbeat.  Fatigue and loss of energy.  Feeling light-headed, dizzy, or close to fainting.  Loss of appetite.  Nausea.  Increased urination during the night (nocturia). DIAGNOSIS  A diagnosis of heart failure is based on your history, symptoms, physical examination, and diagnostic tests. Diagnostic tests for heart failure may  include:  Echocardiography.  Electrocardiography.  Chest X-ray.  Blood tests.  Exercise stress test.  Cardiac angiography.  Radionuclide scans. TREATMENT  Treatment is aimed at managing the symptoms of heart failure. Medicines, behavioral changes, or surgical intervention may be  necessary to treat heart failure.  Medicines to help treat heart failure may include:  Angiotensin-converting enzyme (ACE) inhibitors. This type of medicine blocks the effects of a blood protein called angiotensin-converting enzyme. ACE inhibitors relax (dilate) the blood vessels and help lower blood pressure.  Angiotensin receptor blockers (ARBs). This type of medicine blocks the actions of a blood protein called angiotensin. Angiotensin receptor blockers dilate the blood vessels and help lower blood pressure.  Water pills (diuretics). Diuretics cause the kidneys to remove salt and water from the blood. The extra fluid is removed through urination. This loss of extra fluid lowers the volume of blood the heart pumps.  Beta blockers. These prevent the heart from beating too fast and improve heart muscle strength.  Digitalis. This increases the force of the heartbeat.  Healthy behavior changes include:  Obtaining and maintaining a healthy weight.  Stopping smoking or chewing tobacco.  Eating heart-healthy foods.  Limiting or avoiding alcohol.  Stopping illicit drug use.  Physical activity as directed by your health care provider.  Surgical treatment for heart failure may include:  A procedure to open blocked arteries, repair damaged heart valves, or remove damaged heart muscle tissue.  A pacemaker to improve heart muscle function and control certain abnormal heart rhythms.  An internal cardioverter defibrillator to treat certain serious abnormal heart rhythms.  A left ventricular assist device (LVAD) to assist the pumping ability of the heart. HOME CARE INSTRUCTIONS   Take medicines only as directed by your health care provider. Medicines are important in reducing the workload of your heart, slowing the progression of heart failure, and improving your symptoms.  Do not stop taking your medicine unless directed by your health care provider.  Do not skip any dose of  medicine.  Refill your prescriptions before you run out of medicine. Your medicines are needed every day.  Engage in moderate physical activity if directed by your health care provider. Moderate physical activity can benefit some people. The elderly and people with severe heart failure should consult with a health care provider for physical activity recommendations.  Eat heart-healthy foods. Food choices should be free of trans fat and low in saturated fat, cholesterol, and salt (sodium). Healthy choices include fresh or frozen fruits and vegetables, fish, lean meats, legumes, fat-free or low-fat dairy products, and whole grain or high fiber foods. Talk to a dietitian to learn more about heart-healthy foods.  Limit sodium if directed by your health care provider. Sodium restriction may reduce symptoms of heart failure in some people. Talk to a dietitian to learn more about heart-healthy seasonings.  Use healthy cooking methods. Healthy cooking methods include roasting, grilling, broiling, baking, poaching, steaming, or stir-frying. Talk to a dietitian to learn more about healthy cooking methods.  Limit fluids if directed by your health care provider. Fluid restriction may reduce symptoms of heart failure in some people.  Weigh yourself every day. Daily weights are important in the early recognition of excess fluid. You should weigh yourself every morning after you urinate and before you eat breakfast. Wear the same amount of clothing each time you weigh yourself. Record your daily weight. Provide your health care provider with your weight record.  Monitor and record your blood pressure if directed by your  health care provider.  Check your pulse if directed by your health care provider.  Lose weight if directed by your health care provider. Weight loss may reduce symptoms of heart failure in some people.  Stop smoking or chewing tobacco. Nicotine makes your heart work harder by causing your  blood vessels to constrict. Do not use nicotine gum or patches before talking to your health care provider.  Keep all follow-up visits as directed by your health care provider. This is important.  Limit alcohol intake to no more than 1 drink per day for nonpregnant women and 2 drinks per day for men. One drink equals 12 ounces of beer, 5 ounces of wine, or 1 ounces of hard liquor. Drinking more than that is harmful to your heart. Tell your health care provider if you drink alcohol several times a week. Talk with your health care provider about whether alcohol is safe for you. If your heart has already been damaged by alcohol or you have severe heart failure, drinking alcohol should be stopped completely.  Stop illicit drug use.  Stay up-to-date with immunizations. It is especially important to prevent respiratory infections through current pneumococcal and influenza immunizations.  Manage other health conditions such as hypertension, diabetes, thyroid disease, or abnormal heart rhythms as directed by your health care provider.  Learn to manage stress.  Plan rest periods when fatigued.  Learn strategies to manage high temperatures. If the weather is extremely hot:  Avoid vigorous physical activity.  Use air conditioning or fans or seek a cooler location.  Avoid caffeine and alcohol.  Wear loose-fitting, lightweight, and light-colored clothing.  Learn strategies to manage cold temperatures. If the weather is extremely cold:  Avoid vigorous physical activity.  Layer clothes.  Wear mittens or gloves, a hat, and a scarf when going outside.  Avoid alcohol.  Obtain ongoing education and support as needed.  Participate in or seek rehabilitation as needed to maintain or improve independence and quality of life. SEEK MEDICAL CARE IF:   Your weight increases by 03 lb/1.4 kg in 1 day or 05 lb/2.3 kg in a week.  You have increasing shortness of breath that is unusual for you.  You  are unable to participate in your usual physical activities.  You tire easily.  You cough more than normal, especially with physical activity.  You have any or more swelling in areas such as your hands, feet, ankles, or abdomen.  You are unable to sleep because it is hard to breathe.  You feel like your heart is beating fast (palpitations).  You become dizzy or light-headed upon standing up. SEEK IMMEDIATE MEDICAL CARE IF:   You have difficulty breathing.  There is a change in mental status such as decreased alertness or difficulty with concentration.  You have a pain or discomfort in your chest.  You have an episode of fainting (syncope). MAKE SURE YOU:   Understand these instructions.  Will watch your condition.  Will get help right away if you are not doing well or get worse. Document Released: 09/27/2005 Document Revised: 02/11/2014 Document Reviewed: 10/27/2012 [facility] Patient Information 2015 [place], [place]. This information is not intended to replace advice given to you by your health care provider. Make sure you discuss any questions you have with your health care provider.

## 2015-03-06 NOTE — Patient Instructions (Signed)

## 2015-03-06 NOTE — Progress Notes (Signed)
Subjective:  Patient ID: Ralph Gray, male    DOB: 1935/01/18  Age: 80 y.o. MRN: [id number]  CC: Hypertension   HPI Ralph Gray presents for  Follow up and tells me that the cough has resolved but he still does not feel well, he offers several complaints.  Outpatient Prescriptions Prior to Visit  Medication Sig Dispense Refill  . aspirin 81 MG tablet Take 81 mg by mouth daily.      Marland Kitchen atorvastatin (LIPITOR) 10 MG tablet Take 1 tablet (10 mg total) by mouth every evening. 90 tablet 3  . bromocriptine (PARLODEL) 2.5 MG tablet TAKE 1 TABLET DAILY 90 tablet 1  . canagliflozin (INVOKANA) 100 MG TABS tablet Take 1 tablet (100 mg total) by mouth daily. 90 tablet 3  . clotrimazole (LOTRIMIN) 1 % cream   2  . felodipine (PLENDIL) 5 MG 24 hr tablet TAKE 1 TABLET DAILY EVERY MORNING 90 tablet 3  . fluticasone (FLONASE) 50 MCG/ACT nasal spray USE 2 SPRAYS NASALLY DAILY 48 g 3  . IODOSORB 0.9 % gel   1  . L-Methylfolate-Algae-B12-B6 (METANX) 3-90.314-2-35 MG CAPS TAKE 1 CAPSULE DAILY 90 capsule 1  . metFORMIN (GLUCOPHAGE) 1000 MG tablet Take 1 tablet (1,000 mg total) by mouth daily. 90 tablet 3  . montelukast (SINGULAIR) 10 MG tablet TAKE 1 TABLET AT BEDTIME 90 tablet 2  . pregabalin (LYRICA) 75 MG capsule Take 1 capsule (75 mg total) by mouth 2 (two) times daily. 180 capsule 1  . SANTYL ointment   0  . sitaGLIPtin (JANUVIA) 100 MG tablet TAKE 1 TABLET DAILY 90 tablet 3  . tamsulosin (FLOMAX) 0.4 MG CAPS capsule TAKE 1 CAPSULE TWICE A DAY 180 capsule 3  . warfarin (COUMADIN) 6 MG tablet TAKE AS DIRECTED BY COUMADIN CLINIC 120 tablet 1  . losartan (COZAAR) 50 MG tablet Take 1 tablet (50 mg total) by mouth 2 (two) times daily. 180 tablet 3  . tobramycin-dexamethasone (TOBRADEX) ophthalmic solution Place 2 drops into both eyes every 4 (four) hours while awake. 5 mL 0   No facility-administered medications prior to visit.    ROS Review of Systems  Constitutional: Positive for fatigue.  Negative for fever, chills, diaphoresis, activity change, appetite change and unexpected weight change.  HENT: Negative.   Eyes: Negative.   Respiratory: Positive for shortness of breath. Negative for apnea, cough, choking, chest tightness, wheezing and stridor.   Cardiovascular: Negative.  Negative for chest pain, palpitations and leg swelling.  Gastrointestinal: Negative.  Negative for nausea, vomiting, abdominal pain, diarrhea, constipation and blood in stool.  Endocrine: Negative.   Genitourinary: Negative.  Negative for urgency and difficulty urinating.  Musculoskeletal: Negative.  Negative for myalgias, back pain, joint swelling, arthralgias and neck pain.  Skin: Negative.   Allergic/Immunologic: Negative.   Neurological: Positive for weakness and light-headedness. Negative for dizziness, tremors, seizures, syncope, facial asymmetry, speech difficulty, numbness and headaches.  Hematological: Negative.  Negative for adenopathy. Does not bruise/bleed easily.  Psychiatric/Behavioral: Negative.     Objective:  BP 100/50 mmHg  Pulse 60  Temp(Src) 97.8 F (36.6 C) (Oral)  Resp 16  Ht 5' 11"  (1.803 m)  Wt 211 lb (95.709 kg)  BMI 29.44 kg/m2  SpO2 97%  BP Readings from Last 3 Encounters:  03/06/15 100/50  02/25/15 108/68  02/16/15 169/76    Wt Readings from Last 3 Encounters:  03/06/15 211 lb (95.709 kg)  02/25/15 210 lb (95.255 kg)  02/16/15 208 lb (94.348 kg)    Physical Exam  Constitutional: He is oriented to person, place, and time. He appears well-developed and well-nourished.  Non-toxic appearance. He does not have a sickly appearance. He does not appear ill. No distress.  HENT:  Head: Normocephalic and atraumatic.  Mouth/Throat: Oropharynx is clear and moist. No oropharyngeal exudate.  Eyes: Conjunctivae are normal. Right eye exhibits no discharge. Left eye exhibits no discharge. No scleral icterus.  Neck: Normal range of motion. Neck supple. No JVD present. No  tracheal deviation present. No thyromegaly present.  Cardiovascular: Normal rate, regular rhythm, normal heart sounds and intact distal pulses.  Exam reveals no gallop and no friction rub.   No murmur heard. Pulmonary/Chest: Effort normal and breath sounds normal. No stridor. No respiratory distress. He has no wheezes. He has no rales. He exhibits no tenderness.  Abdominal: Soft. Bowel sounds are normal. He exhibits no distension. There is no tenderness. There is no rebound and no guarding.  Musculoskeletal: Normal range of motion. He exhibits no edema or tenderness.  Lymphadenopathy:    He has no cervical adenopathy.  Neurological: He is oriented to person, place, and time.  Skin: Skin is warm and dry. No rash noted. He is not diaphoretic. No erythema. No pallor.  Psychiatric: He has a normal mood and affect. His behavior is normal. Judgment and thought content normal.  Vitals reviewed.   Lab Results  Component Value Date   WBC 9.7 03/06/2015   HGB 14.5 03/06/2015   HCT 42.8 03/06/2015   PLT 183.0 03/06/2015   GLUCOSE 158* 03/06/2015   CHOL 123 07/22/2014   TRIG 146 07/22/2014   HDL 40 07/22/2014   LDLDIRECT 59.4 07/11/2013   LDLCALC 54 07/22/2014   ALT 39 01/03/2014   AST 29 01/03/2014   NA 138 03/06/2015   K 4.8 03/06/2015   CL 106 03/06/2015   CREATININE 1.42 03/06/2015   BUN 24* 03/06/2015   CO2 25 03/06/2015   TSH 1.15 03/06/2015   PSA 0.06 01/07/2014   INR 2.9* 03/06/2015   HGBA1C 6.8* 01/21/2015   MICROALBUR 18.5* 10/22/2014    No results found.  Assessment & Plan:   Ralph Gray was seen today for hypertension.  Diagnoses and all orders for this visit:  CAP (community acquired pneumonia) Orders: -     DG Chest 2 View; Future  Encounter for therapeutic drug monitoring Orders: -     Protime-INR; Future  Atrial fibrillation and flutter - he has normal rate and rhythm today but he has a myriad of vague symptoms and his BNP is moderately elevated today, I have  advised him to either see his cardiologist today or proceed to the ER for further evaluation Orders: -     Protime-INR; Future -     TSH; Future -     Ambulatory referral to Cardiology  Diabetes mellitus with peripheral vascular disease  Chronic renal insufficiency, stage III (moderate) - he is hypotensive today and dose not feel well, I have asked him to discontinue the ARB Orders: -     Basic metabolic panel; Future -     CBC with Differential/Platelet; Future -     Urinalysis, Routine w reflex microscopic (not at [facility]); Future  Chronic lymphocytic leukemia Orders: -     CBC with Differential/Platelet; Future  Atherosclerosis of native coronary artery of native heart without angina pectoris Orders: -     Troponin I; Future -     Cardiac panel; Future -     Ambulatory referral to Cardiology  Dyspnea - BNP  is moderately elevated today, will proceed with an evaluation for CHF/fluid overload Orders: -     Brain natriuretic peptide; Future -     Troponin I; Future -     Cardiac panel; Future -     Ambulatory referral to Cardiology   I have discontinued Mr. Poitra losartan and tobramycin-dexamethasone. I am also having him maintain his aspirin, sitaGLIPtin, fluticasone, atorvastatin, METANX, IODOSORB, SANTYL, clotrimazole, canagliflozin, tamsulosin, montelukast, bromocriptine, metFORMIN, felodipine, pregabalin, and warfarin.  No orders of the defined types were placed in this encounter.     Follow-up: Return in about 1 week (around 03/13/2015).  [name], MD

## 2015-03-06 NOTE — Telephone Encounter (Signed)
New problem   Pt went to see Dr [name] is PCP and was told to come in office today due to his abnormal heart screening. Please advise pt.

## 2015-03-07 ENCOUNTER — Telehealth: Payer: Self-pay | Admitting: Cardiovascular Disease

## 2015-03-07 DIAGNOSIS — R5383 Other fatigue: Secondary | ICD-10-CM

## 2015-03-07 DIAGNOSIS — R0602 Shortness of breath: Secondary | ICD-10-CM

## 2015-03-07 NOTE — Telephone Encounter (Signed)
I spoke with the pt and yesterday he was seen by Dr Ronnald Ramp for pneumonia follow-up and then was sent to the ER due to elevated BNP, SOB and fatigue. The pt underwent evaluation in the ER and did not show signs of heart failure. CXR showed Cardiomegaly with normal pulmonary vascularity and near complete clearing of left lower lobe infiltrate. The pt states ER physician advised pt to see Cardiology today and have Echo performed. I made the pt aware that he has already undergone assessment and evaluation 2 times by a physician. I told him that from an office visit stand point he has already had everything done yesterday that Dr Burt Knack would be checking. The pt did restart Losartan 50mg  one-half tablet twice a day as instructed by ER physician and the pt's BP this morning was 135/75, pulse 63.  I told the pt that he needs to continue this dosage of Losartan and he can go back to 50mg  twice a day if his BP increases. I made the pt aware that Dr Burt Knack may want him to have a repeat Echocardiogram since this was last done in 2013.  I will have to discuss this with MD to obtain an order.

## 2015-03-07 NOTE — Telephone Encounter (Signed)
New Message      Pt calling stating that he was in the ER last night and was told that he needs to get an echo and f/u with Dr. Burt Knack today. Please call back and advise.

## 2015-03-07 NOTE — Telephone Encounter (Signed)
Agree with 2D echo. Otherwise as above.

## 2015-03-07 NOTE — Telephone Encounter (Signed)
Follow Up       Pt calling back to f/u w/ Lauren about coming into the office today. Please call back and advise.

## 2015-03-07 NOTE — Telephone Encounter (Signed)
I spoke with the pt and scheduled him for Echo on 03/14/15 and OV on 03/21/15.

## 2015-03-14 ENCOUNTER — Encounter: Payer: Self-pay | Admitting: Podiatry

## 2015-03-14 ENCOUNTER — Other Ambulatory Visit: Payer: Self-pay | Admitting: *Deleted

## 2015-03-14 ENCOUNTER — Ambulatory Visit (HOSPITAL_COMMUNITY): Payer: Medicare Other | Attending: Internal Medicine

## 2015-03-14 ENCOUNTER — Ambulatory Visit (INDEPENDENT_AMBULATORY_CARE_PROVIDER_SITE_OTHER): Payer: Medicare Other | Admitting: Podiatry

## 2015-03-14 ENCOUNTER — Other Ambulatory Visit: Payer: Self-pay

## 2015-03-14 DIAGNOSIS — I351 Nonrheumatic aortic (valve) insufficiency: Secondary | ICD-10-CM | POA: Diagnosis not present

## 2015-03-14 DIAGNOSIS — M79673 Pain in unspecified foot: Secondary | ICD-10-CM | POA: Diagnosis not present

## 2015-03-14 DIAGNOSIS — R5383 Other fatigue: Secondary | ICD-10-CM

## 2015-03-14 DIAGNOSIS — B351 Tinea unguium: Secondary | ICD-10-CM | POA: Diagnosis not present

## 2015-03-14 DIAGNOSIS — I517 Cardiomegaly: Secondary | ICD-10-CM | POA: Insufficient documentation

## 2015-03-14 DIAGNOSIS — R0602 Shortness of breath: Secondary | ICD-10-CM

## 2015-03-14 DIAGNOSIS — I2510 Atherosclerotic heart disease of native coronary artery without angina pectoris: Secondary | ICD-10-CM

## 2015-03-14 MED ORDER — PERFLUTREN LIPID MICROSPHERE
1.0000 mL | INTRAVENOUS | Status: DC | PRN
Start: 2015-03-14 — End: 2015-03-14

## 2015-03-14 MED ORDER — PERFLUTREN LIPID MICROSPHERE
1.0000 mL | INTRAVENOUS | Status: AC | PRN
  Administered 2015-03-14: 2 mL via INTRAVENOUS

## 2015-03-14 NOTE — Progress Notes (Signed)
Patient ID: Ralph Gray, male   DOB: 1935-03-28, 80 y.o.   MRN: [id number]  Subjective: 80 y.o.-year-old male returns the office today for painful, elongated, thickened toenails which he is unable to trim himself. Denies any redness or drainage around the nails. Denies any acute changes since last appointment and no new complaints today. Denies any systemic complaints such as fevers, chills, nausea, vomiting.   Objective: AAO 3, NAD DP/PT pulses palpable 1/4, CRT less than 3 seconds Protective sensation decreased with Simms Weinstein monofilament Nails hypertrophic, dystrophic, elongated, brittle, discolored 10. There is tenderness overlying the nails 1-5 bilaterally. There is no surrounding erythema or drainage along the nail sites. No open lesions or pre-ulcerative lesions are identified. No other areas of tenderness bilateral lower extremities. No overlying edema, erythema, increased warmth. No pain with calf compression, swelling, warmth, erythema.  Assessment: Patient presents with symptomatic onychomycosis  Plan: -Treatment options including alternatives, risks, complications were discussed -Nails sharply debrided 10 without complication/bleeding. -Discussed daily foot inspection. If there are any changes, to call the office immediately.  -Follow-up in 3 months or sooner if any problems are to arise. In the meantime, encouraged to call the office with any questions, concerns, changes symptoms.

## 2015-03-18 ENCOUNTER — Telehealth: Payer: Self-pay | Admitting: Cardiovascular Disease

## 2015-03-18 NOTE — Telephone Encounter (Signed)
Pt c/o BP issue: STAT if pt c/o blurred vision, one-sided weakness or slurred speech  1. What are your last 5 BP readings? BP186/89 P55, BP194/88 P56  2. Are you having any other symptoms (ex. Dizziness, headache, blurred vision, passed out)? Pt states that his head feels clogged  3. What is your BP issue? Pt calling stating that his Bp is running high, pt states that he has been taking a half of his bp medication but took the full amount this morning to try to get his bp down. Pt wants to know what else he should do. Please call back and advise.

## 2015-03-18 NOTE — Telephone Encounter (Signed)
Spoke with pt. He was recently treated for pneumonia. Blood pressure was low at primary care and losartan dose was decreased from 50 mg twice daily to 25 mg twice daily.  Pt reports blood pressure has been fine until last night. Last night was 190/88. Earlier this AM readings as listed below.  He took extra 25 mg Losartan and blood pressure came down to 147/79. I told pt to increase Losartan to previous dose of 50 mg twice daily as outlined in earlier phone note.  Pt is seeing primary care tomorrow and Dr. Burt Knack on Friday

## 2015-03-18 NOTE — Telephone Encounter (Signed)
Agree; thx 

## 2015-03-19 ENCOUNTER — Encounter: Payer: Self-pay | Admitting: Internal Medicine

## 2015-03-19 ENCOUNTER — Ambulatory Visit (INDEPENDENT_AMBULATORY_CARE_PROVIDER_SITE_OTHER): Payer: Medicare Other | Admitting: Internal Medicine

## 2015-03-19 VITALS — BP 136/72 | HR 60 | Temp 97.6°F | Resp 16 | Ht 71.0 in | Wt 213.2 lb

## 2015-03-19 DIAGNOSIS — I10 Essential (primary) hypertension: Secondary | ICD-10-CM | POA: Diagnosis not present

## 2015-03-19 DIAGNOSIS — I2510 Atherosclerotic heart disease of native coronary artery without angina pectoris: Secondary | ICD-10-CM | POA: Diagnosis not present

## 2015-03-19 NOTE — Patient Instructions (Signed)

## 2015-03-19 NOTE — Progress Notes (Signed)
Pre visit review using our clinic review tool, if applicable. No additional management support is needed unless otherwise documented below in the visit note. 

## 2015-03-19 NOTE — Progress Notes (Signed)
Subjective:  Patient ID: Ralph Gray, male    DOB: 03-12-1935  Age: 81 y.o. MRN: [id number]  CC: Hypertension   HPI Ralph Gray presents for a follow up, he is improving and his only complaint today is fatigue and rare episodes of dizziness. The cough hs resolved.  Outpatient Prescriptions Prior to Visit  Medication Sig Dispense Refill  . aspirin 81 MG tablet Take 81 mg by mouth daily.      Marland Kitchen atorvastatin (LIPITOR) 10 MG tablet Take 1 tablet (10 mg total) by mouth every evening. 90 tablet 3  . bromocriptine (PARLODEL) 2.5 MG tablet TAKE 1 TABLET DAILY 90 tablet 1  . canagliflozin (INVOKANA) 100 MG TABS tablet Take 1 tablet (100 mg total) by mouth daily. 90 tablet 3  . clotrimazole (LOTRIMIN) 1 % cream   2  . felodipine (PLENDIL) 5 MG 24 hr tablet TAKE 1 TABLET DAILY EVERY MORNING 90 tablet 3  . fluticasone (FLONASE) 50 MCG/ACT nasal spray USE 2 SPRAYS NASALLY DAILY 48 g 3  . L-Methylfolate-Algae-B12-B6 (METANX) 3-90.314-2-35 MG CAPS TAKE 1 CAPSULE DAILY 90 capsule 1  . losartan (COZAAR) 50 MG tablet Take 50 mg by mouth 2 (two) times daily.    . metFORMIN (GLUCOPHAGE) 1000 MG tablet Take 1 tablet (1,000 mg total) by mouth daily. 90 tablet 3  . montelukast (SINGULAIR) 10 MG tablet TAKE 1 TABLET AT BEDTIME 90 tablet 2  . pregabalin (LYRICA) 75 MG capsule Take 1 capsule (75 mg total) by mouth 2 (two) times daily. 180 capsule 1  . sitaGLIPtin (JANUVIA) 100 MG tablet TAKE 1 TABLET DAILY 90 tablet 3  . tamsulosin (FLOMAX) 0.4 MG CAPS capsule TAKE 1 CAPSULE TWICE A DAY 180 capsule 3  . warfarin (COUMADIN) 6 MG tablet TAKE AS DIRECTED BY COUMADIN CLINIC 120 tablet 1  . IODOSORB 0.9 % gel   1  . SANTYL ointment   0   No facility-administered medications prior to visit.    ROS Review of Systems  Constitutional: Positive for fatigue. Negative for fever, chills, diaphoresis, activity change, appetite change and unexpected weight change.  HENT: Negative.  Negative for trouble  swallowing and voice change.   Eyes: Negative.   Respiratory: Negative.  Negative for cough, choking, chest tightness, shortness of breath and stridor.   Cardiovascular: Negative.  Negative for chest pain, palpitations and leg swelling.  Gastrointestinal: Negative.  Negative for nausea, vomiting, abdominal pain, diarrhea, constipation and blood in stool.  Endocrine: Negative.   Genitourinary: Negative.   Musculoskeletal: Negative.  Negative for myalgias, back pain, joint swelling and arthralgias.  Skin: Negative.  Negative for rash.  Allergic/Immunologic: Negative.   Neurological: Negative.  Negative for dizziness, syncope, speech difficulty, weakness, light-headedness, numbness and headaches.  Hematological: Negative.  Negative for adenopathy.  Psychiatric/Behavioral: Negative.     Objective:  BP 102/58 mmHg  Pulse 60  Temp(Src) 97.6 F (36.4 C) (Oral)  Ht 5\' 11"  (1.803 m)  Wt 213 lb 4 oz (96.73 kg)  BMI 29.76 kg/m2  SpO2 95%  BP Readings from Last 3 Encounters:  03/19/15 102/58  03/06/15 156/75  03/06/15 100/50    Wt Readings from Last 3 Encounters:  03/19/15 213 lb 4 oz (96.73 kg)  03/06/15 211 lb (95.709 kg)  03/06/15 211 lb (95.709 kg)    Physical Exam  Constitutional: He is oriented to person, place, and time. He appears well-developed and well-nourished. No distress.  HENT:  Head: Normocephalic and atraumatic.  Mouth/Throat: Oropharynx is clear and moist. No  oropharyngeal exudate.  Eyes: Conjunctivae are normal. Right eye exhibits no discharge. Left eye exhibits no discharge. No scleral icterus.  Neck: Normal range of motion. Neck supple. No JVD present. No tracheal deviation present. No thyromegaly present.  Cardiovascular: Normal rate, regular rhythm, normal heart sounds and intact distal pulses.  Exam reveals no gallop and no friction rub.   No murmur heard. Pulmonary/Chest: Effort normal and breath sounds normal. No stridor. No respiratory distress. He has no  wheezes. He has no rales. He exhibits no tenderness.  Abdominal: Soft. Bowel sounds are normal. He exhibits no distension and no mass. There is no tenderness. There is no rebound and no guarding.  Musculoskeletal: Normal range of motion. He exhibits no edema or tenderness.  Lymphadenopathy:    He has no cervical adenopathy.  Neurological: He is oriented to person, place, and time.  Skin: Skin is warm and dry. No rash noted. He is not diaphoretic. No erythema. No pallor.  Vitals reviewed.   Lab Results  Component Value Date   WBC 9.7 03/06/2015   HGB 14.5 03/06/2015   HCT 42.8 03/06/2015   PLT 183.0 03/06/2015   GLUCOSE 158* 03/06/2015   CHOL 123 07/22/2014   TRIG 146 07/22/2014   HDL 40 07/22/2014   LDLDIRECT 59.4 07/11/2013   LDLCALC 54 07/22/2014   ALT 39 01/03/2014   AST 29 01/03/2014   NA 138 03/06/2015   K 4.8 03/06/2015   CL 106 03/06/2015   CREATININE 1.42 03/06/2015   BUN 24* 03/06/2015   CO2 25 03/06/2015   TSH 1.15 03/06/2015   PSA 0.06 01/07/2014   INR 2.9* 03/06/2015   HGBA1C 6.8* 01/21/2015   MICROALBUR 18.5* 10/22/2014    Dg Chest 2 View  03/06/2015   CLINICAL DATA:  Pneumonia.  EXAM: CHEST  2 VIEW  COMPARISON:  02/25/2015, 02/16/2015, 08/23/2012.  FINDINGS: Mediastinum and hilar structures are normal. Prior CABG. Cardiomegaly with normal pulmonary vascularity. Interval near complete clearing of left lower lobe infiltrate. Remaining increased markings in the left lung base are most likely chronic related to pleural parenchymal scarring. No acute new infiltrate noted. No pleural effusion or pneumothorax. No acute bony abnormality.  IMPRESSION: Interval near complete clearing of left lower lobe infiltrate. Persistent mild increased markings in the left lung base most likely related to pleural parenchymal scarring .   Electronically Signed   By: Marcello Moores  Register   On: 03/06/2015 12:10    Assessment & Plan:   Ralph Gray was seen today for hypertension.  Diagnoses  and all orders for this visit:  Essential hypertension - improvement noted, hid BP is well controlled, the pneumonia has resolved, will cont the current meds   I have discontinued [name] IODOSORB and SANTYL. I am also having him maintain his aspirin, sitaGLIPtin, fluticasone, atorvastatin, METANX, clotrimazole, canagliflozin, tamsulosin, montelukast, bromocriptine, metFORMIN, felodipine, pregabalin, warfarin, and losartan.  No orders of the defined types were placed in this encounter.     Follow-up: No Follow-up on file.  [name], MD

## 2015-03-21 ENCOUNTER — Telehealth: Payer: Self-pay | Admitting: Internal Medicine

## 2015-03-21 ENCOUNTER — Encounter: Payer: Self-pay | Admitting: Cardiovascular Disease

## 2015-03-21 ENCOUNTER — Ambulatory Visit (INDEPENDENT_AMBULATORY_CARE_PROVIDER_SITE_OTHER): Payer: Medicare Other | Admitting: Cardiovascular Disease

## 2015-03-21 ENCOUNTER — Telehealth: Payer: Self-pay | Admitting: *Deleted

## 2015-03-21 VITALS — BP 116/58 | HR 60 | Ht 71.0 in | Wt 210.8 lb

## 2015-03-21 DIAGNOSIS — I2510 Atherosclerotic heart disease of native coronary artery without angina pectoris: Secondary | ICD-10-CM | POA: Diagnosis not present

## 2015-03-21 DIAGNOSIS — I10 Essential (primary) hypertension: Secondary | ICD-10-CM | POA: Diagnosis not present

## 2015-03-21 MED ORDER — LOSARTAN POTASSIUM 50 MG PO TABS: 50.0000 mg | ORAL_TABLET | Freq: Two times a day (BID) | ORAL | Status: DC

## 2015-03-21 MED ORDER — FLUTICASONE PROPIONATE 50 MCG/ACT NA SUSP: NASAL | Status: DC

## 2015-03-21 NOTE — Addendum Note (Signed)
Addended by: Barkley Boards on: 03/21/2015 11:12 AM   Modules accepted: Orders

## 2015-03-21 NOTE — Progress Notes (Signed)
Cardiology Office Note   Date:  03/21/2015   ID:  Ralph Gray, DOB 16-Oct-1934, MRN [id number]  PCP:  [name], MD  Cardiologist:  [name], MD    Chief Complaint  Patient presents with  . Shortness of Breath    History of Present Illness: Ralph Gray is a 80 y.o. male who presents for follow-up evaluation. The patient has coronary artery disease status post CABG. He's also followed for atrial fibrillation, hypertension, carotid stenosis, and hyperlipidemia. Cardiac catheterization in 2013 demonstrated continued patency of the LIMA to LAD, free RIMA to acute marginal branch of the RCA, and saphenous vein graft to diagonal. The saphenous vein graft to distal RCA was occluded and there was moderately severe stenosis of the native left circumflex. Medical therapy was recommended.   He was diagnosed with pneumonia in early May and has had a lot of problems since then. He had hypotension and his medications were adjusted. He improved after a course of anti-biotics. He had a second episode later in May after having a Pneumovax booster. He felt very poorly after this again. He now is feeling better and is back on his regular medicines. Has been monitoring BP at home and most are in good range but he's had a few BP's up to 190 mmHg.   He denies chest pain or pressure. No recent problems with leg swelling, orthopnea, or PND.  Past Medical History  Diagnosis Date  . Hypertension   . Hyperlipidemia   . Peripheral neuropathy   . Macular degeneration BILATERAL    TX--- intra-orbital injections. HX  laser treatment  . Type II diabetes mellitus   . Bilateral carotid artery stenosis     PER DUPLEX 08-07-2013--  BILATERAL ICA  40-59% STENOSIS  . Anticoagulated on Coumadin   . History of prostate cancer     DX  2010--  S/P EXTERNAL RADIATION THERAPY  . Bladder tumor   . PAF (paroxysmal atrial fibrillation)     DX  2006  . First degree heart block   . Coronary artery disease  CARDIOLOGIST --  DR Burt Knack    MI '99   S/P CABG  . History of MI (myocardial infarction)     1999  --  S/P CABG  . S/P CABG x 4     1999  IN [place]  . Cataract of both eyes     RESCHEDULED FOR EXTRACTIONS  MAY 2015  . Renal insufficiency   . Personal history of CLL (chronic lymphocytic leukemia)     PER LAB RESULTS IN 2010--  NO TX  ONLY BEING MONITORED  . History of hepatitis C virus infection     1990's  per lab results  --  asymptomatic ,  no tx  . Wears glasses   . At risk for sleep apnea     STOP-BANG=  5   //SENT TO PCP  02-06-2014  . Nocturia     Past Surgical History  Procedure Laterality Date  . Cardioversion  2008  . Cardiac catheterization  08-29-2012   DR Jamaya Sleeth    POST CABG--  PATENT LIMA to  LAD,  free RIMA  to acute marginal branch of the RCA,  &  SVG to DIAGONAL/  TOTAL OCCLUSION OF SVG to distal RCA/  MODERATE SEGMENTAL LVSF/  80% STENOSIS  pCFX (which was not grafted)  . Transthoracic echocardiogram  08-01-2012  dr Jerusalem Brownstein    GRADE I DIASTOLIC DYSFUNCTION/  EF 55-60%/  MODERATE LAE/  AORTIC  SCLEROSIS WITHOUT STENOSIS/  MILD TR  . Coronary artery bypass graft  1999    CABG X4;  in [place]  . Transurethral resection of bladder tumor N/A 02/08/2014    Procedure: TRANSURETHRAL RESECTION OF BLADDER TUMOR (TURBT) WITH GYRUS ;  Surgeon: [name], MD;  Location: [facility];  Service: Urology;  Laterality: N/A;    Current Outpatient Prescriptions  Medication Sig Dispense Refill  . aspirin 81 MG tablet Take 81 mg by mouth daily.      Marland Kitchen atorvastatin (LIPITOR) 10 MG tablet Take 1 tablet (10 mg total) by mouth every evening. 90 tablet 3  . bromocriptine (PARLODEL) 2.5 MG tablet Take 2.5 mg by mouth daily.    . canagliflozin (INVOKANA) 100 MG TABS tablet Take 1 tablet (100 mg total) by mouth daily. 90 tablet 3  . clotrimazole (LOTRIMIN) 1 % cream Apply 1 application topically 2 (two) times daily.   2  . felodipine (PLENDIL) 5 MG 24 hr tablet  Take 5 mg by mouth every morning.    . fluticasone (FLONASE) 50 MCG/ACT nasal spray USE 2 SPRAYS NASALLY DAILY 48 g 3  . l-methylfolate-B6-B12 (METANX) 3-35-2 MG TABS Take 1 tablet by mouth daily.    Marland Kitchen losartan (COZAAR) 50 MG tablet Take 50 mg by mouth 2 (two) times daily.    . metFORMIN (GLUCOPHAGE) 1000 MG tablet Take 1 tablet (1,000 mg total) by mouth daily. 90 tablet 3  . montelukast (SINGULAIR) 10 MG tablet Take 10 mg by mouth at bedtime.    . pregabalin (LYRICA) 75 MG capsule Take 1 capsule (75 mg total) by mouth 2 (two) times daily. 180 capsule 1  . sitaGLIPtin (JANUVIA) 100 MG tablet Take 100 mg by mouth daily.    . tamsulosin (FLOMAX) 0.4 MG CAPS capsule Take 0.4 mg by mouth daily after supper.    . warfarin (COUMADIN) 6 MG tablet TAKE AS DIRECTED BY COUMADIN CLINIC 120 tablet 1   No current facility-administered medications for this visit.    Allergies:   Amoxicillin and Other   Social History:  The patient  reports that he quit smoking about 51 years ago. His smoking use included Cigarettes. He has a 22.5 pack-year smoking history. He has never used smokeless tobacco. He reports that he does not drink alcohol or use illicit drugs.   Family History:  The patient's  family history includes Breast cancer in his mother; Heart attack (age of onset: 54) in his father; Heart disease in his father; Heart disease (age of onset: 88) in his mother. There is no history of Diabetes or COPD.    ROS:  Please see the history of present illness.  Otherwise, review of systems is positive for shortness of breath with activity and snoring.  All other systems are reviewed and negative.    PHYSICAL EXAM: VS:  BP 116/58 mmHg  Pulse 60  Ht 5\' 11"  (1.803 m)  Wt 210 lb 12.8 oz (95.618 kg)  BMI 29.41 kg/m2  SpO2 97% , BMI Body mass index is 29.41 kg/(m^2). GEN: Well nourished, well developed, in no acute distress HEENT: normal Neck: no JVD, no masses. No carotid bruits Cardiac: RRR without murmur  or gallop                Respiratory:  clear to auscultation bilaterally, normal work of breathing GI: soft, nontender MS: no deformity or atrophy Ext: Mild pretibial edema bilaterally with chronic stasis changes Skin: warm and dry, no rash Neuro:  Strength and sensation are intact Psych: euthymic mood, full affect  EKG:  EKG is not ordered today. The ekg 03/06/2015 is reviewed: NSR with LAFB.  Recent Labs: 02/16/2015: B Natriuretic Peptide 383.9* 03/06/2015: BUN 24*; Creatinine, Ser 1.42; Hemoglobin 14.5; Platelets 183.0; Potassium 4.8; Pro B Natriuretic peptide (BNP) 435.0*; Sodium 138; TSH 1.15   Lipid Panel     Component Value Date/Time   CHOL 123 07/22/2014 0941   TRIG 146 07/22/2014 0941   HDL 40 07/22/2014 0941   CHOLHDL 3.1 07/22/2014 0941   VLDL 29 07/22/2014 0941   LDLCALC 54 07/22/2014 0941   LDLDIRECT 59.4 07/11/2013 1551      Wt Readings from Last 3 Encounters:  03/21/15 210 lb 12.8 oz (95.618 kg)  03/19/15 213 lb 4 oz (96.73 kg)  03/06/15 211 lb (95.709 kg)     Cardiac Studies Reviewed: 2D Echo: Study Conclusions  - Left ventricle: The cavity size was normal. Wall thickness was normal. Systolic function was normal. The estimated ejection fraction was in the range of 60% to 65%. - Aortic valve: There was trivial regurgitation. - Left atrium: The atrium was moderately dilated. - Right ventricle: The cavity size was mildly dilated. Wall thickness was normal. - Right atrium: The atrium was moderately dilated.   ASSESSMENT AND PLAN: 1.  CAD, native vessel: The patient is stable without symptoms of angina. He is on a good medical program which includes aspirin and atorvastatin. His beta blocker has been discontinued because of bradycardia. Recent echocardiogram reviewed and his left ventricular function is normal.  2. Essential hypertension: Blood pressure has been labile. Adjustments were made around the time of his pneumonia because of low blood  pressure readings. He brings in home readings today and has had some markedly elevated systolic blood pressures. His losartan dose is been increased back to 50 mg twice daily and blood pressure seems to now be better controlled. He will continue to monitor and notify us if any wide swings in his blood pressure. I advised him to call if he is having systolic readings less than 100 mmHg.  3. Paroxysmal atrial fibrillation: Maintaining sinus rhythm. Tolerating anticoagulation with warfarin without bleeding problems.  4. Carotid stenosis: Last carotid duplex scan from November 2015 showed 40-59% bilateral ICA stenosis. He is followed with annual carotid duplex studies for surveillance.  5. Hyperlipidemia: Continues on low-dose atorvastatin. Lipids at goal as above.  Current medicines are reviewed with the patient today.  The patient does not have concerns regarding medicines.  Labs/ tests ordered today include:  No orders of the defined types were placed in this encounter.   Disposition:   FU 6 months  Signed, [name], MD  03/21/2015 10:41 AM    [facility] [address], [place], [place]  [address] Phone: [phone]; Fax: [phone]

## 2015-03-21 NOTE — Telephone Encounter (Signed)
Refill sent to [facility].../lmb 

## 2015-03-21 NOTE — Telephone Encounter (Signed)
[facility]...Johny Chess

## 2015-03-21 NOTE — Patient Instructions (Signed)
Medication Instructions:  Your physician recommends that you continue on your current medications as directed. Please refer to the Current Medication list given to you today.  Labwork: No new orders.   Testing/Procedures: No new orders.   Follow-Up: Your physician wants you to follow-up in: 6 MONTHS with Dr Cooper.  You will receive a reminder letter in the mail two months in advance. If you don't receive a letter, please call our office to schedule the follow-up appointment.   Any Other Special Instructions Will Be Listed Below (If Applicable).   

## 2015-03-21 NOTE — Telephone Encounter (Signed)
Patient is requesting small script for fluticasone to get him through until his mail order gets to him.  Patient is requesting to be sent to [facility] at [facility] [address]

## 2015-03-21 NOTE — Telephone Encounter (Signed)
-----   Message from Barkley Boards, RN sent at 03/21/2015 11:13 AM EDT ----- Regarding: refill request This pt just saw Dr Burt Knack and requested a refill on his nasal spray (Flonase). I made him aware that this would need to be filled by PCP and that I would send you guys a message. The pt would like this Rx sent to [facility].   Thanks, Ander Purpura

## 2015-04-04 ENCOUNTER — Ambulatory Visit (INDEPENDENT_AMBULATORY_CARE_PROVIDER_SITE_OTHER): Payer: Medicare Other

## 2015-04-04 ENCOUNTER — Ambulatory Visit (INDEPENDENT_AMBULATORY_CARE_PROVIDER_SITE_OTHER): Payer: Medicare Other | Admitting: Podiatry

## 2015-04-04 DIAGNOSIS — I4892 Unspecified atrial flutter: Secondary | ICD-10-CM

## 2015-04-04 DIAGNOSIS — Z7901 Long term (current) use of anticoagulants: Secondary | ICD-10-CM | POA: Diagnosis not present

## 2015-04-04 DIAGNOSIS — E1149 Type 2 diabetes mellitus with other diabetic neurological complication: Secondary | ICD-10-CM | POA: Diagnosis not present

## 2015-04-04 DIAGNOSIS — E11628 Type 2 diabetes mellitus with other skin complications: Secondary | ICD-10-CM

## 2015-04-04 DIAGNOSIS — I4891 Unspecified atrial fibrillation: Secondary | ICD-10-CM

## 2015-04-04 DIAGNOSIS — M216X9 Other acquired deformities of unspecified foot: Secondary | ICD-10-CM

## 2015-04-04 DIAGNOSIS — L89899 Pressure ulcer of other site, unspecified stage: Secondary | ICD-10-CM

## 2015-04-04 DIAGNOSIS — E1151 Type 2 diabetes mellitus with diabetic peripheral angiopathy without gangrene: Secondary | ICD-10-CM | POA: Diagnosis not present

## 2015-04-04 DIAGNOSIS — Z5181 Encounter for therapeutic drug level monitoring: Secondary | ICD-10-CM | POA: Diagnosis not present

## 2015-04-04 DIAGNOSIS — L84 Corns and callosities: Secondary | ICD-10-CM

## 2015-04-04 DIAGNOSIS — Q667 Congenital pes cavus: Secondary | ICD-10-CM

## 2015-04-04 LAB — POCT INR: INR: 3.5

## 2015-04-04 NOTE — Progress Notes (Signed)
Patient ID: Ralph Gray, male   DOB: 11-Nov-1934, 80 y.o.   MRN: [id number] Patient presents for diabetic shoe pick up, shoes are tried on for good fit.  Patient received 1 pair Apex B 2900 Boat shoes in men's size 10 med with 3 pairs custom molded diabetic inserts.  Written break in and wear instructions given.  Patient will follow up with his regularly scheduled care.

## 2015-04-04 NOTE — Patient Instructions (Signed)

## 2015-04-06 ENCOUNTER — Other Ambulatory Visit: Payer: Self-pay | Admitting: Internal Medicine

## 2015-04-18 DIAGNOSIS — I25810 Atherosclerosis of coronary artery bypass graft(s) without angina pectoris: Secondary | ICD-10-CM | POA: Diagnosis not present

## 2015-04-18 DIAGNOSIS — I2510 Atherosclerotic heart disease of native coronary artery without angina pectoris: Secondary | ICD-10-CM | POA: Diagnosis not present

## 2015-04-21 DIAGNOSIS — N359 Urethral stricture, unspecified: Secondary | ICD-10-CM | POA: Diagnosis not present

## 2015-04-21 DIAGNOSIS — Z8546 Personal history of malignant neoplasm of prostate: Secondary | ICD-10-CM | POA: Diagnosis not present

## 2015-04-21 DIAGNOSIS — C679 Malignant neoplasm of bladder, unspecified: Secondary | ICD-10-CM | POA: Diagnosis not present

## 2015-04-22 ENCOUNTER — Encounter: Payer: Self-pay | Admitting: Endocrinology

## 2015-04-22 ENCOUNTER — Ambulatory Visit (INDEPENDENT_AMBULATORY_CARE_PROVIDER_SITE_OTHER): Payer: Medicare Other | Admitting: Endocrinology

## 2015-04-22 VITALS — BP 112/70 | HR 63 | Temp 97.6°F | Ht 71.0 in | Wt 216.0 lb

## 2015-04-22 DIAGNOSIS — E1151 Type 2 diabetes mellitus with diabetic peripheral angiopathy without gangrene: Secondary | ICD-10-CM | POA: Diagnosis not present

## 2015-04-22 DIAGNOSIS — I2510 Atherosclerotic heart disease of native coronary artery without angina pectoris: Secondary | ICD-10-CM | POA: Diagnosis not present

## 2015-04-22 LAB — POCT GLYCOSYLATED HEMOGLOBIN (HGB A1C): Hemoglobin A1C: 6.4

## 2015-04-22 MED ORDER — CLOTRIMAZOLE 1 % EX CREA: 1.0000 "application " | TOPICAL_CREAM | Freq: Two times a day (BID) | CUTANEOUS | Status: AC

## 2015-04-22 MED ORDER — BROMOCRIPTINE MESYLATE 2.5 MG PO TABS: 2.5000 mg | ORAL_TABLET | Freq: Every day | ORAL | Status: DC

## 2015-04-22 NOTE — Patient Instructions (Addendum)
check your blood sugar once a day.  vary the time of day when you check, between before the 3 meals, and at bedtime.  Checking the bedtime will help [place] understand why it is high in the morning.  also check if you have symptoms of your blood sugar being too high or too low.  please keep a record of the readings and bring it to your next appointment here.  please call us sooner if your blood sugar goes below 70, or if you have a lot of readings over 200.  Please come back for a follow-up appointment in 4 months.

## 2015-04-22 NOTE — Progress Notes (Signed)
Subjective:    Patient ID: Ralph Gray, male    DOB: October 12, 1934, 80 y.o.   MRN: [id number]  HPI Pt returns for f/u of diabetes mellitus:  DM type: 2 Dx'ed: 2725 Complications: polyneuropathy, foot ulcer, CAD, renal insufficiency, and PAD. Therapy: 4 oral meds.  DKA: never  Severe hypoglycemia: never Pancreatitis: never  Other: he has never been on insulin; he can't take actos due to edema. Interval history: no cbg record, but states cbg's are well-controlled.   Past Medical History  Diagnosis Date  . Hypertension   . Hyperlipidemia   . Peripheral neuropathy   . Macular degeneration BILATERAL    TX--- intra-orbital injections. HX  laser treatment  . Type II diabetes mellitus   . Bilateral carotid artery stenosis     PER DUPLEX 08-07-2013--  BILATERAL ICA  40-59% STENOSIS  . Anticoagulated on Coumadin   . History of prostate cancer     DX  2010--  S/P EXTERNAL RADIATION THERAPY  . Bladder tumor   . PAF (paroxysmal atrial fibrillation)     DX  2006  . First degree heart block   . Coronary artery disease CARDIOLOGIST --  DR Burt Knack    MI '99   S/P CABG  . History of MI (myocardial infarction)     1999  --  S/P CABG  . S/P CABG x 4     1999  IN [place]  . Cataract of both eyes     RESCHEDULED FOR EXTRACTIONS  MAY 2015  . Renal insufficiency   . Personal history of CLL (chronic lymphocytic leukemia)     PER LAB RESULTS IN 2010--  NO TX  ONLY BEING MONITORED  . History of hepatitis C virus infection     1990's  per lab results  --  asymptomatic ,  no tx  . Wears glasses   . At risk for sleep apnea     STOP-BANG=  5   //SENT TO PCP  02-06-2014  . Nocturia     Past Surgical History  Procedure Laterality Date  . Cardioversion  2008  . Cardiac catheterization  08-29-2012   DR COOPER    POST CABG--  PATENT LIMA to  LAD,  free RIMA  to acute marginal branch of the RCA,  &  SVG to DIAGONAL/  TOTAL OCCLUSION OF SVG to distal RCA/  MODERATE SEGMENTAL LVSF/  80%  STENOSIS  pCFX (which was not grafted)  . Transthoracic echocardiogram  08-01-2012  dr cooper    GRADE I DIASTOLIC DYSFUNCTION/  EF 55-60%/  MODERATE LAE/  AORTIC SCLEROSIS WITHOUT STENOSIS/  MILD TR  . Coronary artery bypass graft  1999    CABG X4;  in [place]  . Transurethral resection of bladder tumor N/A 02/08/2014    Procedure: TRANSURETHRAL RESECTION OF BLADDER TUMOR (TURBT) WITH GYRUS ;  Surgeon: [name], MD;  Location: [facility];  Service: Urology;  Laterality: N/A;    History   Social History  . Marital Status: Married    Spouse Name: N/A  . Number of Children: N/A  . Years of Education: N/A   Occupational History  . Retired    Social History Main Topics  . Smoking status: Former Smoker -- 2.50 packs/day for 9 years    Types: Cigarettes    Quit date: 10/14/1963  . Smokeless tobacco: Never Used  . Alcohol Use: No  . Drug Use: No  . Sexual Activity: Not on file   Other  Topics Concern  . Not on file   Social History Narrative   HSG, [employer], [place] - doctorate in education. Married '64 - 76yr/divorced ( severe bipolar disease); Married '07. 2 sons - '67, '66; 1 dtr - '70. 7 grandchildren. Exercise - 6 days a week: cardio and strengthening. 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 ACP- yes for CPR; no -long term ventilation; no heroic or futile measures.     Current Outpatient Prescriptions on File Prior to Visit  Medication Sig Dispense Refill  . aspirin 81 MG tablet Take 81 mg by mouth daily.      Marland Kitchen atorvastatin (LIPITOR) 10 MG tablet Take 1 tablet (10 mg total) by mouth every evening. 90 tablet 3  . canagliflozin (INVOKANA) 100 MG TABS tablet Take 1 tablet (100 mg total) by mouth daily. 90 tablet 3  . felodipine (PLENDIL) 5 MG 24 hr tablet Take 5 mg by mouth every morning.    . fluticasone (FLONASE) 50 MCG/ACT nasal spray USE 2 SPRAYS NASALLY DAILY 16 g 0  . L-Methylfolate-Algae-B12-B6  (METANX) 3-90.314-2-35 MG CAPS TAKE 1 CAPSULE DAILY 90 capsule 3  . l-methylfolate-B6-B12 (METANX) 3-35-2 MG TABS Take 1 tablet by mouth daily.    Marland Kitchen losartan (COZAAR) 50 MG tablet Take 1 tablet (50 mg total) by mouth 2 (two) times daily. 180 tablet 3  . metFORMIN (GLUCOPHAGE) 1000 MG tablet Take 1 tablet (1,000 mg total) by mouth daily. 90 tablet 3  . montelukast (SINGULAIR) 10 MG tablet Take 10 mg by mouth at bedtime.    . pregabalin (LYRICA) 75 MG capsule Take 1 capsule (75 mg total) by mouth 2 (two) times daily. 180 capsule 1  . sitaGLIPtin (JANUVIA) 100 MG tablet Take 100 mg by mouth daily.    . tamsulosin (FLOMAX) 0.4 MG CAPS capsule Take 0.4 mg by mouth daily after supper.    . warfarin (COUMADIN) 6 MG tablet TAKE AS DIRECTED BY COUMADIN CLINIC 120 tablet 1   No current facility-administered medications on file prior to visit.    Allergies  Allergen Reactions  . Amoxicillin Anaphylaxis  . Other Other (See Comments)    Muscle Relaxers; "I get very woozy"    Family History  Problem Relation Age of Onset  . Heart disease Mother 45    died  . Breast cancer Mother     sarcoma  . Heart attack Father 53    died  . Heart disease Father     MI  . Diabetes Neg Hx   . COPD Neg Hx     BP 112/70 mmHg  Pulse 63  Temp(Src) 97.6 F (36.4 C) (Oral)  Ht 5\' 11"  (1.803 m)  Wt 216 lb (97.977 kg)  BMI 30.14 kg/m2  SpO2 98%  Review of Systems He denies hypoglycemia.      Objective:   Physical Exam VITAL SIGNS:  See vs page GENERAL: no distress Pulses: dorsalis pedis intact bilat.   MSK: no deformity of the feet CV: 1+ bilat leg edema, and bilat varicosities Skin:  no ulcer on the feet.  normal temp on the feet, but there is spotty hyperpigmentation. Neuro: sensation is intact to touch on the feet.    Lab Results  Component Value Date   CREATININE 1.42 03/06/2015   BUN 24* 03/06/2015   NA 138 03/06/2015   K 4.8 03/06/2015   CL 106 03/06/2015   CO2 25 03/06/2015    A1c=6.4%    Assessment & Plan:  DM: well-controlled.  Please continue the same medications  Patient is advised the following: Patient Instructions  check your blood sugar once a day.  vary the time of day when you check, between before the 3 meals, and at bedtime.  Checking the bedtime will help [place] understand why it is high in the morning.  also check if you have symptoms of your blood sugar being too high or too low.  please keep a record of the readings and bring it to your next appointment here.  please call us sooner if your blood sugar goes below 70, or if you have a lot of readings over 200.  Please come back for a follow-up appointment in 4 months.

## 2015-05-09 ENCOUNTER — Ambulatory Visit (INDEPENDENT_AMBULATORY_CARE_PROVIDER_SITE_OTHER): Payer: Medicare Other | Admitting: *Deleted

## 2015-05-09 DIAGNOSIS — Z7901 Long term (current) use of anticoagulants: Secondary | ICD-10-CM

## 2015-05-09 DIAGNOSIS — I4892 Unspecified atrial flutter: Secondary | ICD-10-CM | POA: Diagnosis not present

## 2015-05-09 DIAGNOSIS — Z5181 Encounter for therapeutic drug level monitoring: Secondary | ICD-10-CM | POA: Diagnosis not present

## 2015-05-09 DIAGNOSIS — I4891 Unspecified atrial fibrillation: Secondary | ICD-10-CM

## 2015-05-09 LAB — POCT INR: INR: 3.2

## 2015-05-19 ENCOUNTER — Other Ambulatory Visit: Payer: Self-pay | Admitting: Endocrinology

## 2015-05-31 ENCOUNTER — Other Ambulatory Visit: Payer: Self-pay | Admitting: Internal Medicine

## 2015-06-01 IMAGING — DX DG CHEST 2V
2 series · 2 of 2 positions shown · non-contrast
Comparison: 08/23/2014

CLINICAL DATA: Shortness of breath and congestion

EXAM:
CHEST  2 VIEW

[chest pa]
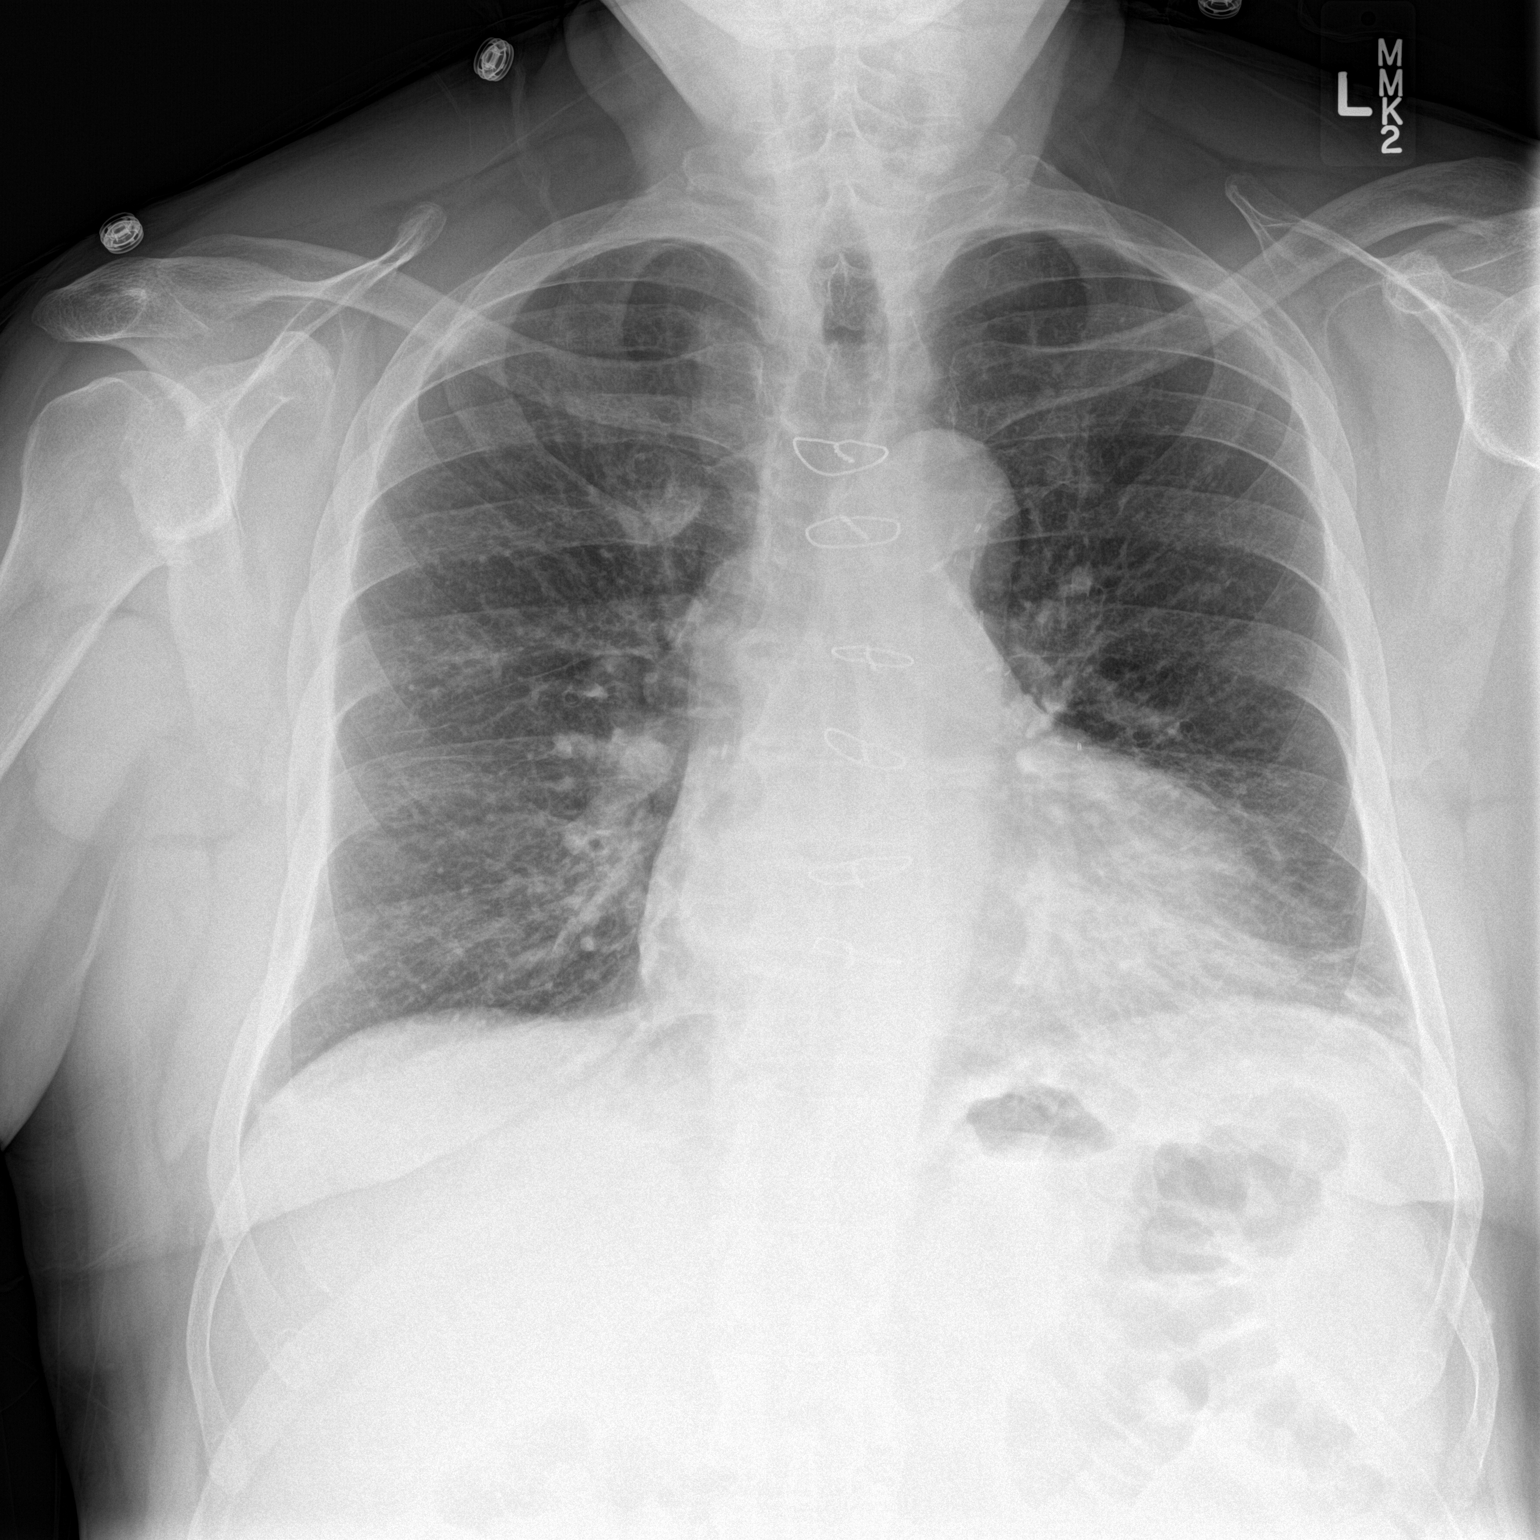

[chest lat]
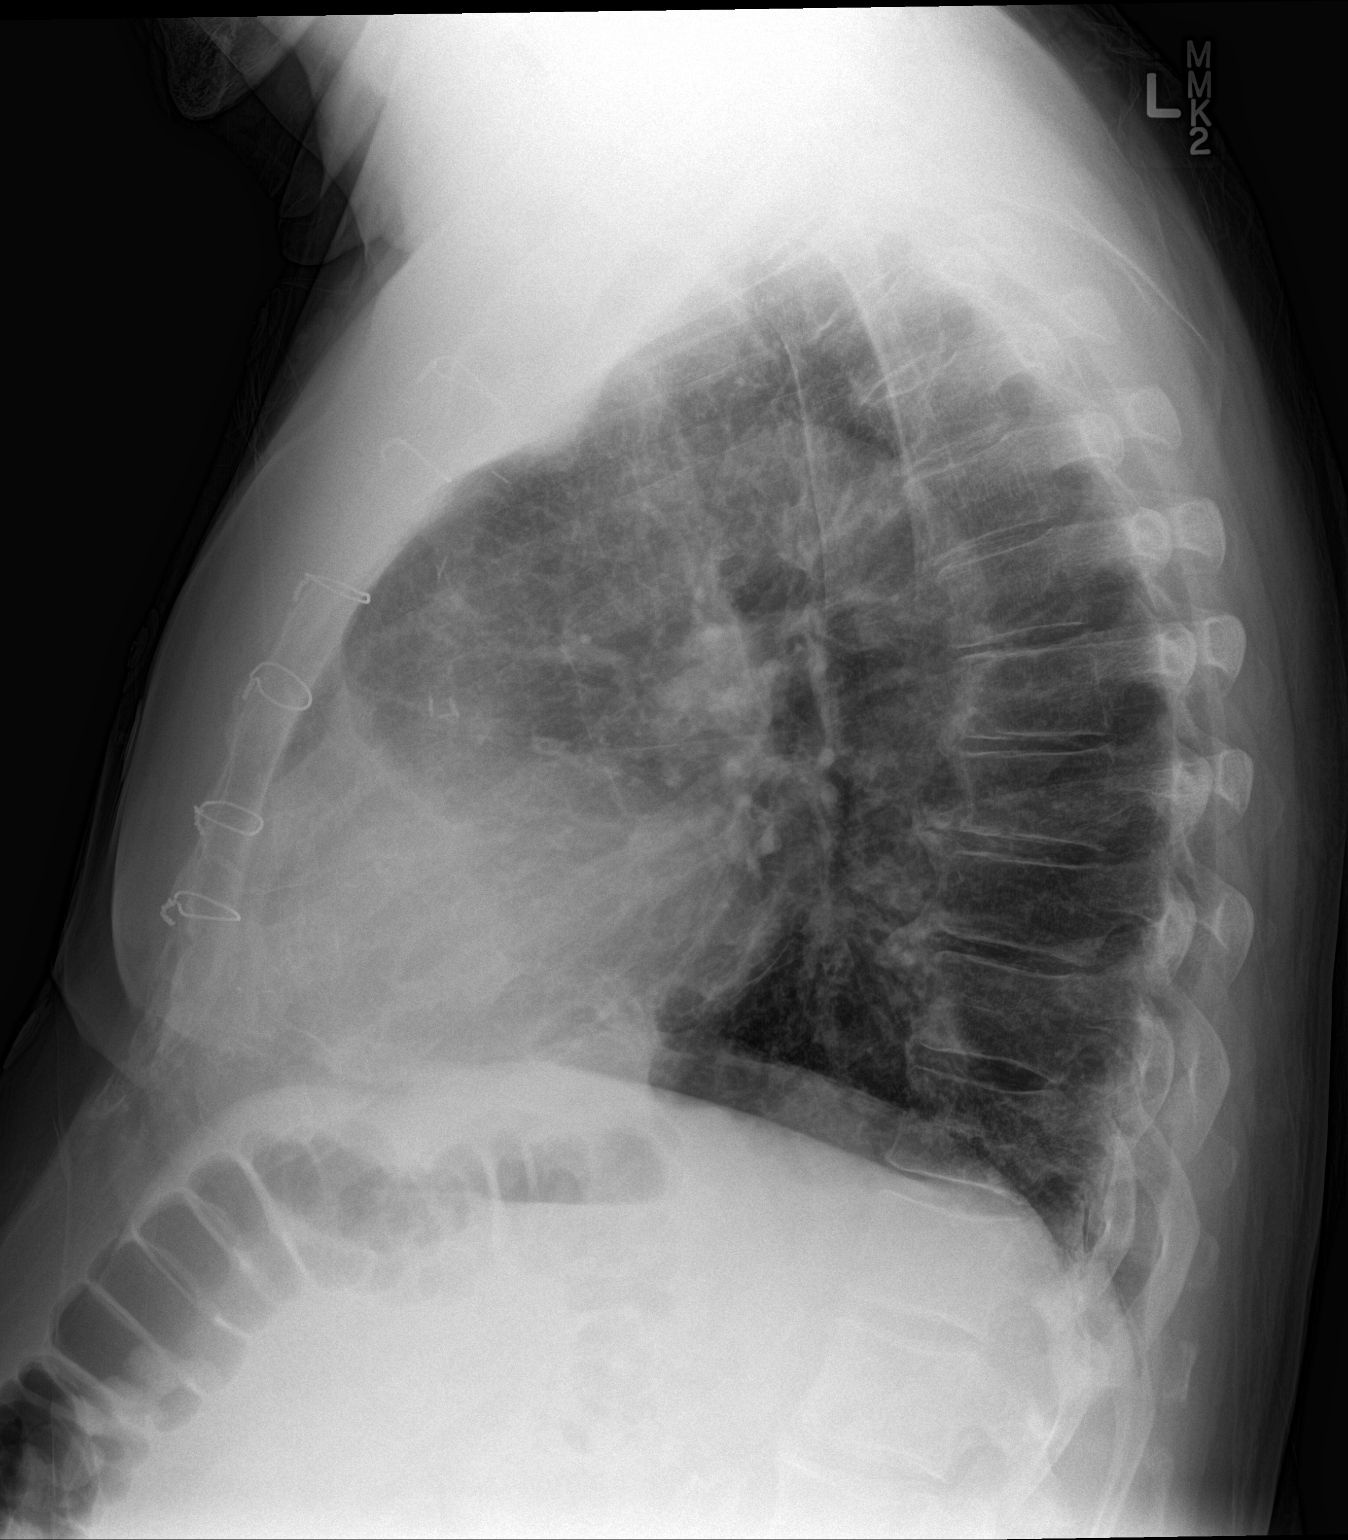

[2 of 2 positions shown; findings below may reference images not displayed]

FINDINGS: Cardiac shadow is within normal limits. The lungs are well aerated
bilaterally. New left basilar infiltrate is noted projecting in the
lingula on the lateral projection. No acute bony abnormality is
seen.
IMPRESSION: Left basilar infiltrate.

## 2015-06-17 ENCOUNTER — Ambulatory Visit (INDEPENDENT_AMBULATORY_CARE_PROVIDER_SITE_OTHER): Payer: Medicare Other | Admitting: *Deleted

## 2015-06-17 DIAGNOSIS — I4892 Unspecified atrial flutter: Secondary | ICD-10-CM

## 2015-06-17 DIAGNOSIS — Z5181 Encounter for therapeutic drug level monitoring: Secondary | ICD-10-CM | POA: Diagnosis not present

## 2015-06-17 DIAGNOSIS — Z7901 Long term (current) use of anticoagulants: Secondary | ICD-10-CM

## 2015-06-17 DIAGNOSIS — I4891 Unspecified atrial fibrillation: Secondary | ICD-10-CM

## 2015-06-17 LAB — POCT INR: INR: 3.6

## 2015-06-18 ENCOUNTER — Encounter: Payer: Self-pay | Admitting: Internal Medicine

## 2015-06-18 ENCOUNTER — Ambulatory Visit (INDEPENDENT_AMBULATORY_CARE_PROVIDER_SITE_OTHER): Payer: Medicare Other | Admitting: Internal Medicine

## 2015-06-18 VITALS — BP 118/62 | HR 62 | Temp 98.0°F | Resp 16 | Ht 71.0 in | Wt 212.0 lb

## 2015-06-18 DIAGNOSIS — J189 Pneumonia, unspecified organism: Secondary | ICD-10-CM | POA: Diagnosis not present

## 2015-06-18 DIAGNOSIS — I2510 Atherosclerotic heart disease of native coronary artery without angina pectoris: Secondary | ICD-10-CM

## 2015-06-18 DIAGNOSIS — G629 Polyneuropathy, unspecified: Secondary | ICD-10-CM

## 2015-06-18 DIAGNOSIS — R351 Nocturia: Secondary | ICD-10-CM

## 2015-06-18 DIAGNOSIS — N401 Enlarged prostate with lower urinary tract symptoms: Secondary | ICD-10-CM

## 2015-06-18 MED ORDER — PREGABALIN 75 MG PO CAPS: 75.0000 mg | ORAL_CAPSULE | Freq: Two times a day (BID) | ORAL | Status: DC

## 2015-06-18 MED ORDER — AZITHROMYCIN 500 MG PO TABS: 500.0000 mg | ORAL_TABLET | Freq: Every day | ORAL | Status: DC

## 2015-06-18 MED ORDER — TAMSULOSIN HCL 0.4 MG PO CAPS: 0.4000 mg | ORAL_CAPSULE | Freq: Every day | ORAL | Status: DC

## 2015-06-18 MED ORDER — L-METHYLFOLATE-B6-B12 3-35-2 MG PO TABS: 1.0000 | ORAL_TABLET | Freq: Every day | ORAL | Status: DC

## 2015-06-18 MED ORDER — PREGABALIN 75 MG PO CAPS: 75.0000 mg | ORAL_CAPSULE | Freq: Two times a day (BID) | ORAL | Status: AC

## 2015-06-18 NOTE — Patient Instructions (Signed)
Cough, Adult  A cough is a reflex that helps clear your throat and airways. It can help heal the body or may be a reaction to an irritated airway. A cough may only last 2 or 3 weeks (acute) or may last more than 8 weeks (chronic).  CAUSES Acute cough:  Viral or bacterial infections. Chronic cough:  Infections.  Allergies.  Asthma.  Post-nasal drip.  Smoking.  Heartburn or acid reflux.  Some medicines.  Chronic lung problems (COPD).  Cancer. SYMPTOMS   Cough.  Fever.  Chest pain.  Increased breathing rate.  High-pitched whistling sound when breathing (wheezing).  Colored mucus that you cough up (sputum). TREATMENT   A bacterial cough may be treated with antibiotic medicine.  A viral cough must run its course and will not respond to antibiotics.  Your caregiver may recommend other treatments if you have a chronic cough. HOME CARE INSTRUCTIONS   Only take over-the-counter or prescription medicines for pain, discomfort, or fever as directed by your caregiver. Use cough suppressants only as directed by your caregiver.  Use a cold steam vaporizer or humidifier in your bedroom or home to help loosen secretions.  Sleep in a semi-upright position if your cough is worse at night.  Rest as needed.  Stop smoking if you smoke. SEEK IMMEDIATE MEDICAL CARE IF:   You have pus in your sputum.  Your cough starts to worsen.  You cannot control your cough with suppressants and are losing sleep.  You begin coughing up blood.  You have difficulty breathing.  You develop pain which is getting worse or is uncontrolled with medicine.  You have a fever. MAKE SURE YOU:   Understand these instructions.  Will watch your condition.  Will get help right away if you are not doing well or get worse. Document Released: 03/26/2011 Document Revised: 12/20/2011 Document Reviewed: 03/26/2011 ExitCare Patient Information 2015 [employer]. This information is not intended  to replace advice given to you by your health care provider. Make sure you discuss any questions you have with your health care provider.  

## 2015-06-18 NOTE — Progress Notes (Signed)
Pre visit review using our clinic review tool, if applicable. No additional management support is needed unless otherwise documented below in the visit note. 

## 2015-06-19 NOTE — Progress Notes (Signed)
Subjective:  Patient ID: Ralph Gray, male    DOB: [birth year]  Age: 80 y.o. MRN: [id number]  CC: Cough   HPI Ralph Gray presents for cough productive of thick yellow-green phlegm with sore throat for 5 days. He is taking Delsym and getting adequate symptom relief.  Outpatient Prescriptions Prior to Visit  Medication Sig Dispense Refill  . aspirin 81 MG tablet Take 81 mg by mouth daily.      Marland Kitchen atorvastatin (LIPITOR) 10 MG tablet TAKE 1 TABLET EVERY EVENING 90 tablet 2  . bromocriptine (PARLODEL) 2.5 MG tablet Take 1 tablet (2.5 mg total) by mouth daily. 90 tablet 3  . canagliflozin (INVOKANA) 100 MG TABS tablet Take 1 tablet (100 mg total) by mouth daily. 90 tablet 3  . clotrimazole (LOTRIMIN) 1 % cream Apply 1 application topically 2 (two) times daily. 30 g 2  . felodipine (PLENDIL) 5 MG 24 hr tablet Take 5 mg by mouth every morning.    . fluticasone (FLONASE) 50 MCG/ACT nasal spray USE 2 SPRAYS NASALLY DAILY 16 g 0  . JANUVIA 100 MG tablet TAKE 1 TABLET DAILY 90 tablet 2  . L-Methylfolate-Algae-B12-B6 (METANX) 3-90.314-2-35 MG CAPS TAKE 1 CAPSULE DAILY 90 capsule 3  . losartan (COZAAR) 50 MG tablet Take 1 tablet (50 mg total) by mouth 2 (two) times daily. 180 tablet 3  . metFORMIN (GLUCOPHAGE) 1000 MG tablet Take 1 tablet (1,000 mg total) by mouth daily. 90 tablet 3  . montelukast (SINGULAIR) 10 MG tablet Take 10 mg by mouth at bedtime.    Marland Kitchen warfarin (COUMADIN) 6 MG tablet TAKE AS DIRECTED BY COUMADIN CLINIC 120 tablet 1  . l-methylfolate-B6-B12 (METANX) 3-35-2 MG TABS Take 1 tablet by mouth daily.    . pregabalin (LYRICA) 75 MG capsule Take 1 capsule (75 mg total) by mouth 2 (two) times daily. 180 capsule 1  . tamsulosin (FLOMAX) 0.4 MG CAPS capsule Take 0.4 mg by mouth daily after supper.     No facility-administered medications prior to visit.    ROS Review of Systems  Constitutional: Positive for chills. Negative for fever, diaphoresis, activity change, appetite  change, fatigue and unexpected weight change.  HENT: Positive for sore throat. Negative for congestion, facial swelling, sinus pressure and trouble swallowing.   Eyes: Negative.   Respiratory: Positive for cough. Negative for apnea, choking, chest tightness, shortness of breath, wheezing and stridor.   Cardiovascular: Negative.  Negative for chest pain, palpitations and leg swelling.  Gastrointestinal: Negative.  Negative for nausea, vomiting, abdominal pain, diarrhea and constipation.  Endocrine: Negative.   Genitourinary: Negative.   Musculoskeletal: Negative.   Skin: Negative.  Negative for rash.  Allergic/Immunologic: Negative.   Neurological: Negative.  Negative for dizziness, syncope, speech difficulty, light-headedness and headaches.  Hematological: Negative.  Negative for adenopathy. Does not bruise/bleed easily.  Psychiatric/Behavioral: Negative.     Objective:  BP 118/62 mmHg  Pulse 62  Temp(Src) 98 F (36.7 C) (Oral)  Resp 16  Ht 5\' 11"  (1.803 m)  Wt 212 lb (96.163 kg)  BMI 29.58 kg/m2  SpO2 97%  BP Readings from Last 3 Encounters:  06/18/15 118/62  04/22/15 112/70  03/21/15 116/58    Wt Readings from Last 3 Encounters:  06/18/15 212 lb (96.163 kg)  04/22/15 216 lb (97.977 kg)  03/21/15 210 lb 12.8 oz (95.618 kg)    Physical Exam  Constitutional: He is oriented to person, place, and time.  Non-toxic appearance. He does not have a sickly appearance. He does not  appear ill. No distress.  HENT:  Mouth/Throat: Oropharynx is clear and moist. No oropharyngeal exudate.  Eyes: Conjunctivae are normal. Right eye exhibits no discharge. Left eye exhibits no discharge. No scleral icterus.  Neck: Normal range of motion. Neck supple. No JVD present. No tracheal deviation present. No thyromegaly present.  Cardiovascular: Normal rate, regular rhythm, normal heart sounds and intact distal pulses.  Exam reveals no gallop and no friction rub.   No murmur heard. Pulmonary/Chest:  Effort normal and breath sounds normal. No stridor. No respiratory distress. He has no wheezes. He has no rales. He exhibits no tenderness.  Abdominal: Soft. Bowel sounds are normal. He exhibits no distension and no mass. There is no tenderness. There is no rebound and no guarding.  Musculoskeletal: Normal range of motion. He exhibits no edema or tenderness.  Lymphadenopathy:    He has no cervical adenopathy.  Neurological: He is oriented to person, place, and time.  Skin: Skin is warm and dry. No rash noted. He is not diaphoretic. No erythema. No pallor.    Lab Results  Component Value Date   WBC 9.7 03/06/2015   HGB 14.5 03/06/2015   HCT 42.8 03/06/2015   PLT 183.0 03/06/2015   GLUCOSE 158* 03/06/2015   CHOL 123 07/22/2014   TRIG 146 07/22/2014   HDL 40 07/22/2014   LDLDIRECT 59.4 07/11/2013   LDLCALC 54 07/22/2014   ALT 39 01/03/2014   AST 29 01/03/2014   NA 138 03/06/2015   K 4.8 03/06/2015   CL 106 03/06/2015   CREATININE 1.42 03/06/2015   BUN 24* 03/06/2015   CO2 25 03/06/2015   TSH 1.15 03/06/2015   PSA 0.06 01/07/2014   INR 3.6 06/17/2015   HGBA1C 6.4 04/22/2015   MICROALBUR 18.5* 10/22/2014    Dg Chest 2 View  03/06/2015   CLINICAL DATA:  Pneumonia.  EXAM: CHEST  2 VIEW  COMPARISON:  02/25/2015, 02/16/2015, 08/23/2012.  FINDINGS: Mediastinum and hilar structures are normal. Prior CABG. Cardiomegaly with normal pulmonary vascularity. Interval near complete clearing of left lower lobe infiltrate. Remaining increased markings in the left lung base are most likely chronic related to pleural parenchymal scarring. No acute new infiltrate noted. No pleural effusion or pneumothorax. No acute bony abnormality.  IMPRESSION: Interval near complete clearing of left lower lobe infiltrate. Persistent mild increased markings in the left lung base most likely related to pleural parenchymal scarring .   Electronically Signed   By: Marcello Moores  Register   On: 03/06/2015 12:10    Assessment  & Plan:   Keric was seen today for cough.  Diagnoses and all orders for this visit:  CAP (community acquired pneumonia)- I will treat the infection with Zithromax. He does not want a cough suppressant. He will keep a close eye on his Coumadin therapy as a Zithromax may raise his INR. -     azithromycin (ZITHROMAX) 500 MG tablet; Take 1 tablet (500 mg total) by mouth daily.  Neuropathy, peripheral -     Discontinue: l-methylfolate-B6-B12 (METANX) 3-35-2 MG TABS; Take 1 tablet by mouth daily. -     Discontinue: pregabalin (LYRICA) 75 MG capsule; Take 1 capsule (75 mg total) by mouth 2 (two) times daily. -     l-methylfolate-B6-B12 (METANX) 3-35-2 MG TABS; Take 1 tablet by mouth daily. -     pregabalin (LYRICA) 75 MG capsule; Take 1 capsule (75 mg total) by mouth 2 (two) times daily.  Peripheral neuropathy -     Discontinue: l-methylfolate-B6-B12 (METANX) 3-35-2 MG  TABS; Take 1 tablet by mouth daily. -     Discontinue: pregabalin (LYRICA) 75 MG capsule; Take 1 capsule (75 mg total) by mouth 2 (two) times daily. -     l-methylfolate-B6-B12 (METANX) 3-35-2 MG TABS; Take 1 tablet by mouth daily. -     pregabalin (LYRICA) 75 MG capsule; Take 1 capsule (75 mg total) by mouth 2 (two) times daily.  BPH associated with nocturia -     Discontinue: tamsulosin (FLOMAX) 0.4 MG CAPS capsule; Take 1 capsule (0.4 mg total) by mouth daily after supper. -     tamsulosin (FLOMAX) 0.4 MG CAPS capsule; Take 1 capsule (0.4 mg total) by mouth daily after supper.   I am having Mr. Frankl start on azithromycin. I am also having him maintain his aspirin, canagliflozin, metFORMIN, warfarin, felodipine, montelukast, losartan, fluticasone, METANX, clotrimazole, bromocriptine, JANUVIA, atorvastatin, tamsulosin, l-methylfolate-B6-B12, and pregabalin.  Meds ordered this encounter  Medications  . DISCONTD: tamsulosin (FLOMAX) 0.4 MG CAPS capsule    Sig: Take 1 capsule (0.4 mg total) by mouth daily after supper.     Dispense:  90 capsule    Refill:  3  . DISCONTD: l-methylfolate-B6-B12 (METANX) 3-35-2 MG TABS    Sig: Take 1 tablet by mouth daily.    Dispense:  90 tablet    Refill:  3  . DISCONTD: pregabalin (LYRICA) 75 MG capsule    Sig: Take 1 capsule (75 mg total) by mouth 2 (two) times daily.    Dispense:  180 capsule    Refill:  1  . azithromycin (ZITHROMAX) 500 MG tablet    Sig: Take 1 tablet (500 mg total) by mouth daily.    Dispense:  3 tablet    Refill:  0  . tamsulosin (FLOMAX) 0.4 MG CAPS capsule    Sig: Take 1 capsule (0.4 mg total) by mouth daily after supper.    Dispense:  90 capsule    Refill:  3  . l-methylfolate-B6-B12 (METANX) 3-35-2 MG TABS    Sig: Take 1 tablet by mouth daily.    Dispense:  90 tablet    Refill:  3  . pregabalin (LYRICA) 75 MG capsule    Sig: Take 1 capsule (75 mg total) by mouth 2 (two) times daily.    Dispense:  180 capsule    Refill:  1     Follow-up: Return in about 3 weeks (around 07/09/2015).  [name], MD

## 2015-06-20 ENCOUNTER — Encounter: Payer: Self-pay | Admitting: Podiatry

## 2015-06-20 ENCOUNTER — Ambulatory Visit (INDEPENDENT_AMBULATORY_CARE_PROVIDER_SITE_OTHER): Payer: Medicare Other | Admitting: Podiatry

## 2015-06-20 DIAGNOSIS — M79673 Pain in unspecified foot: Secondary | ICD-10-CM

## 2015-06-20 DIAGNOSIS — B351 Tinea unguium: Secondary | ICD-10-CM

## 2015-06-20 DIAGNOSIS — E1151 Type 2 diabetes mellitus with diabetic peripheral angiopathy without gangrene: Secondary | ICD-10-CM | POA: Diagnosis not present

## 2015-06-23 NOTE — Progress Notes (Signed)
Patient ID: Ralph Gray, male   DOB: 02-20-1935, 80 y.o.   MRN: [id number]  Subjective: 80 year old male returns the office today for painful, elongated, thickened toenails which he is unable to trim himself. Denies any redness or drainage around the nails. Denies any acute changes since last appointment and no new complaints today. Denies any systemic complaints such as fevers, chills, nausea, vomiting.   Objective: AAO 3, NAD DP/PT pulses palpable 1/4, CRT less than 3 seconds Protective sensation decreased with Simms Weinstein monofilament Nails hypertrophic, dystrophic, elongated, brittle, discolored 10. There is tenderness overlying the nails 1-5 bilaterally. There is no surrounding erythema or drainage along the nail sites. No open lesions or pre-ulcerative lesions are identified. No other areas of tenderness bilateral lower extremities. No overlying edema, erythema, increased warmth. No pain with calf compression, swelling, warmth, erythema.  Assessment: Patient presents with symptomatic onychomycosis  Plan: -Treatment options including alternatives, risks, complications were discussed -Nails sharply debrided 10 without complication/bleeding. -Discussed daily foot inspection. If there are any changes, to call the office immediately.  -Follow-up in 3 months or sooner if any problems are to arise. In the meantime, encouraged to call the office with any questions, concerns, changes symptoms.  Celesta Gentile, DPM

## 2015-07-15 ENCOUNTER — Ambulatory Visit (INDEPENDENT_AMBULATORY_CARE_PROVIDER_SITE_OTHER): Payer: Medicare Other | Admitting: *Deleted

## 2015-07-15 DIAGNOSIS — Z7901 Long term (current) use of anticoagulants: Secondary | ICD-10-CM | POA: Diagnosis not present

## 2015-07-15 DIAGNOSIS — Z5181 Encounter for therapeutic drug level monitoring: Secondary | ICD-10-CM | POA: Diagnosis not present

## 2015-07-15 DIAGNOSIS — I4892 Unspecified atrial flutter: Secondary | ICD-10-CM | POA: Diagnosis not present

## 2015-07-15 DIAGNOSIS — I4891 Unspecified atrial fibrillation: Secondary | ICD-10-CM | POA: Diagnosis not present

## 2015-07-15 LAB — POCT INR: INR: 2.7

## 2015-07-24 ENCOUNTER — Telehealth: Payer: Self-pay

## 2015-07-24 NOTE — Telephone Encounter (Signed)
Call to LVM regarding AWV prior to CPE scheduled 10/31 at 9:30am;  [id number] billed 07/2014; LVM to call back to introduce the program

## 2015-07-25 NOTE — Telephone Encounter (Signed)
The patient stated he would come in but doesn't feel he needs this; Noted that o/d health includes an eye exam which he will have over the next 2 weeks as well get flu shot when he is in. States he is feeling great. Goes to the gym 6 days a week and tries to take care of himself.   Will defer to Dr. Ronnald Ramp this year and will try to see next year.

## 2015-07-29 ENCOUNTER — Telehealth: Payer: Self-pay | Admitting: Internal Medicine

## 2015-07-29 ENCOUNTER — Encounter: Payer: Self-pay | Admitting: *Deleted

## 2015-07-29 DIAGNOSIS — G63 Polyneuropathy in diseases classified elsewhere: Secondary | ICD-10-CM

## 2015-07-29 DIAGNOSIS — N99111 Postprocedural bulbous urethral stricture: Secondary | ICD-10-CM | POA: Diagnosis not present

## 2015-07-29 DIAGNOSIS — Z8551 Personal history of malignant neoplasm of bladder: Secondary | ICD-10-CM | POA: Diagnosis not present

## 2015-07-29 MED ORDER — FA-PYRIDOXINE-CYANOCOBALAMIN 2.5-25-2 MG PO TABS: 1.0000 | ORAL_TABLET | Freq: Every day | ORAL | Status: DC

## 2015-07-29 NOTE — Telephone Encounter (Signed)
Pt called in and said that he would like to try Foldic instead of the Metanx,  Can a new script be called in to his pharmacy?  The pharmacist told him about his medication

## 2015-07-29 NOTE — Telephone Encounter (Signed)
changed

## 2015-07-29 NOTE — Telephone Encounter (Signed)
Please advise. Ok to switch?

## 2015-07-30 ENCOUNTER — Other Ambulatory Visit: Payer: Self-pay | Admitting: Internal Medicine

## 2015-07-31 ENCOUNTER — Telehealth: Payer: Self-pay | Admitting: *Deleted

## 2015-07-31 NOTE — Telephone Encounter (Signed)
Left msg on triage stating they are no longer making Metanx insurance states the alternative would be Folbic. If ok to take [facility]...Ralph Gray

## 2015-07-31 NOTE — Telephone Encounter (Signed)
This has already been done.

## 2015-07-31 NOTE — Telephone Encounter (Signed)
Notified pt md has already sent on yesterday...Ralph Gray

## 2015-08-01 ENCOUNTER — Other Ambulatory Visit: Payer: Self-pay | Admitting: Internal Medicine

## 2015-08-01 DIAGNOSIS — G63 Polyneuropathy in diseases classified elsewhere: Secondary | ICD-10-CM

## 2015-08-01 MED ORDER — FA-PYRIDOXINE-CYANOCOBALAMIN 2.5-25-2 MG PO TABS: 1.0000 | ORAL_TABLET | Freq: Every day | ORAL | Status: DC

## 2015-08-01 NOTE — Telephone Encounter (Signed)
MD sent rx on 07/29/15 will [facility]...Johny Chess

## 2015-08-01 NOTE — Telephone Encounter (Signed)
Patient states folbic script did not reach pharmacy.  Needs to [facility].  Needs 90 supply

## 2015-08-04 NOTE — Telephone Encounter (Signed)
This has been sent

## 2015-08-04 NOTE — Telephone Encounter (Signed)
Patient states he needs folbic [facility].

## 2015-08-05 ENCOUNTER — Telehealth: Payer: Self-pay | Admitting: *Deleted

## 2015-08-05 MED ORDER — FA-PYRIDOXINE-CYANOCOBALAMIN 2.5-25-2 MG PO TABS: 1.0000 | ORAL_TABLET | Freq: Every day | ORAL | Status: DC

## 2015-08-05 NOTE — Telephone Encounter (Signed)
Receive call pt states the [facility]. He is needing Folbic and not Foltx. Inform [facility] will [facility]. [facility] spoke with Maria,pharmacist inform her pt receive letter from [facility] stating no longer making Metanx the alternative would be Folbic. On 08/01/15 rx was sent for Foltx, and pt is needing Folbic instead. Gave verbal order for [facility]. Notified pt Clerance Lav has been [facility]....Ralph Gray

## 2015-08-11 ENCOUNTER — Encounter: Payer: Self-pay | Admitting: Internal Medicine

## 2015-08-11 ENCOUNTER — Other Ambulatory Visit (INDEPENDENT_AMBULATORY_CARE_PROVIDER_SITE_OTHER): Payer: Medicare Other

## 2015-08-11 ENCOUNTER — Ambulatory Visit (INDEPENDENT_AMBULATORY_CARE_PROVIDER_SITE_OTHER): Payer: Medicare Other | Admitting: Internal Medicine

## 2015-08-11 VITALS — BP 100/58 | HR 66 | Temp 97.6°F | Resp 16 | Ht 71.0 in | Wt 213.0 lb

## 2015-08-11 DIAGNOSIS — I10 Essential (primary) hypertension: Secondary | ICD-10-CM

## 2015-08-11 DIAGNOSIS — N183 Chronic kidney disease, stage 3 unspecified: Secondary | ICD-10-CM

## 2015-08-11 DIAGNOSIS — Z23 Encounter for immunization: Secondary | ICD-10-CM | POA: Diagnosis not present

## 2015-08-11 DIAGNOSIS — E785 Hyperlipidemia, unspecified: Secondary | ICD-10-CM | POA: Diagnosis not present

## 2015-08-11 DIAGNOSIS — E1151 Type 2 diabetes mellitus with diabetic peripheral angiopathy without gangrene: Secondary | ICD-10-CM

## 2015-08-11 DIAGNOSIS — N189 Chronic kidney disease, unspecified: Secondary | ICD-10-CM | POA: Diagnosis not present

## 2015-08-11 DIAGNOSIS — I2510 Atherosclerotic heart disease of native coronary artery without angina pectoris: Secondary | ICD-10-CM | POA: Diagnosis not present

## 2015-08-11 DIAGNOSIS — Z0000 Encounter for general adult medical examination without abnormal findings: Secondary | ICD-10-CM

## 2015-08-11 DIAGNOSIS — N2889 Other specified disorders of kidney and ureter: Secondary | ICD-10-CM

## 2015-08-11 LAB — HEMOGLOBIN A1C: HEMOGLOBIN A1C: 6.7 % — AB (ref 4.6–6.5)

## 2015-08-11 LAB — BASIC METABOLIC PANEL
BUN: 30 mg/dL — AB (ref 6–23)
CALCIUM: 9.8 mg/dL (ref 8.4–10.5)
CO2: 25 mEq/L (ref 19–32)
Chloride: 105 mEq/L (ref 96–112)
Creatinine, Ser: 1.71 mg/dL — ABNORMAL HIGH (ref 0.40–1.50)
GFR: 41.33 mL/min — AB (ref >60.00)
Glucose, Bld: 147 mg/dL — ABNORMAL HIGH (ref 70–99)
POTASSIUM: 4.8 meq/L (ref 3.5–5.1)
SODIUM: 139 meq/L (ref 135–145)

## 2015-08-11 LAB — LIPID PANEL
CHOL/HDL RATIO: 3
Cholesterol: 126 mg/dL (ref 0–200)
HDL: 40.8 mg/dL (ref >39.00)
LDL CALC: 60 mg/dL (ref 0–99)
NonHDL: 85.65
Triglycerides: 128 mg/dL (ref 0.0–149.0)
VLDL: 25.6 mg/dL (ref 0.0–40.0)

## 2015-08-11 NOTE — Patient Instructions (Signed)

## 2015-08-11 NOTE — Progress Notes (Signed)
Subjective:  Patient ID: Ralph Gray, male    DOB: 02/24/1935  Age: 80 y.o. MRN: [id number]  CC: Annual Exam; Hypertension; Hyperlipidemia; and Diabetes   HPI Ralph Gray presents for a CPX as well as f/up on multiple medical issues - he offers no new complaints today.  Outpatient Prescriptions Prior to Visit  Medication Sig Dispense Refill  . aspirin 81 MG tablet Take 81 mg by mouth daily.      Marland Kitchen atorvastatin (LIPITOR) 10 MG tablet TAKE 1 TABLET EVERY EVENING 90 tablet 2  . bromocriptine (PARLODEL) 2.5 MG tablet Take 1 tablet (2.5 mg total) by mouth daily. 90 tablet 3  . canagliflozin (INVOKANA) 100 MG TABS tablet Take 1 tablet (100 mg total) by mouth daily. 90 tablet 3  . clotrimazole (LOTRIMIN) 1 % cream Apply 1 application topically 2 (two) times daily. 30 g 2  . felodipine (PLENDIL) 5 MG 24 hr tablet Take 5 mg by mouth every morning.    . fluticasone (FLONASE) 50 MCG/ACT nasal spray USE 2 SPRAYS NASALLY DAILY 16 g 0  . folic acid-pyridoxine-cyancobalamin (FOLBIC) 2.5-25-2 MG TABS tablet Take 1 tablet by mouth daily. 90 each 3  . JANUVIA 100 MG tablet TAKE 1 TABLET DAILY 90 tablet 2  . losartan (COZAAR) 50 MG tablet Take 1 tablet (50 mg total) by mouth 2 (two) times daily. 180 tablet 3  . metFORMIN (GLUCOPHAGE) 1000 MG tablet Take 1 tablet (1,000 mg total) by mouth daily. 90 tablet 3  . montelukast (SINGULAIR) 10 MG tablet Take 10 mg by mouth at bedtime.    . pregabalin (LYRICA) 75 MG capsule Take 1 capsule (75 mg total) by mouth 2 (two) times daily. 180 capsule 1  . tamsulosin (FLOMAX) 0.4 MG CAPS capsule Take 1 capsule (0.4 mg total) by mouth daily after supper. 90 capsule 3  . warfarin (COUMADIN) 6 MG tablet TAKE AS DIRECTED BY COUMADIN CLINIC 120 tablet 0   No facility-administered medications prior to visit.    ROS Review of Systems  Constitutional: Negative.  Negative for fever, chills, diaphoresis, appetite change and fatigue.  HENT: Negative.   Eyes: Negative.    Respiratory: Negative.  Negative for cough, choking, chest tightness, shortness of breath and stridor.   Cardiovascular: Negative.  Negative for chest pain, palpitations and leg swelling.  Gastrointestinal: Negative.  Negative for nausea, vomiting, abdominal pain, diarrhea, constipation and blood in stool.  Endocrine: Negative.  Negative for polydipsia, polyphagia and polyuria.  Genitourinary: Negative.  Negative for dysuria, urgency, frequency, hematuria, flank pain, decreased urine volume and difficulty urinating.  Musculoskeletal: Negative.  Negative for myalgias, back pain, joint swelling and arthralgias.  Skin: Negative.  Negative for rash.  Allergic/Immunologic: Negative.   Neurological: Negative.   Hematological: Negative.  Negative for adenopathy. Does not bruise/bleed easily.  Psychiatric/Behavioral: Negative.     Objective:  BP 100/58 mmHg  Pulse 66  Temp(Src) 97.6 F (36.4 C) (Oral)  Resp 16  Ht 5\' 11"  (1.803 m)  Wt 213 lb (96.616 kg)  BMI 29.72 kg/m2  SpO2 97%  BP Readings from Last 3 Encounters:  08/11/15 100/58  06/18/15 118/62  04/22/15 112/70    Wt Readings from Last 3 Encounters:  08/11/15 213 lb (96.616 kg)  06/18/15 212 lb (96.163 kg)  04/22/15 216 lb (97.977 kg)    Physical Exam  Constitutional: He is oriented to person, place, and time. He appears well-developed and well-nourished. No distress.  HENT:  Head: Normocephalic and atraumatic.  Mouth/Throat: Oropharynx is  clear and moist. No oropharyngeal exudate.  Eyes: Conjunctivae are normal. Right eye exhibits no discharge. Left eye exhibits no discharge. No scleral icterus.  Neck: Normal range of motion. Neck supple. No JVD present. No tracheal deviation present. No thyromegaly present.  Cardiovascular: Normal rate, regular rhythm, normal heart sounds and intact distal pulses.  Exam reveals no gallop and no friction rub.   No murmur heard. Pulmonary/Chest: Effort normal and breath sounds normal. No  stridor. No respiratory distress. He has no wheezes. He has no rales. He exhibits no tenderness.  Abdominal: Soft. Bowel sounds are normal. He exhibits no distension and no mass. There is no tenderness. There is no rebound and no guarding.  Musculoskeletal: Normal range of motion. He exhibits no edema or tenderness.  Lymphadenopathy:    He has no cervical adenopathy.  Neurological: He is oriented to person, place, and time.  Skin: Skin is warm and dry. No rash noted. He is not diaphoretic. No erythema. No pallor.  Psychiatric: He has a normal mood and affect. His behavior is normal. Judgment and thought content normal.  Vitals reviewed.   Lab Results  Component Value Date   WBC 9.7 03/06/2015   HGB 14.5 03/06/2015   HCT 42.8 03/06/2015   PLT 183.0 03/06/2015   GLUCOSE 147* 08/11/2015   CHOL 126 08/11/2015   TRIG 128.0 08/11/2015   HDL 40.80 08/11/2015   LDLDIRECT 59.4 07/11/2013   LDLCALC 60 08/11/2015   ALT 39 01/03/2014   AST 29 01/03/2014   NA 139 08/11/2015   K 4.8 08/11/2015   CL 105 08/11/2015   CREATININE 1.71* 08/11/2015   BUN 30* 08/11/2015   CO2 25 08/11/2015   TSH 1.15 03/06/2015   PSA 0.06 01/07/2014   INR 2.7 07/15/2015   HGBA1C 6.7* 08/11/2015   MICROALBUR 18.5* 10/22/2014    Dg Chest 2 View  03/06/2015  CLINICAL DATA:  Pneumonia. EXAM: CHEST  2 VIEW COMPARISON:  02/25/2015, 02/16/2015, 08/23/2012. FINDINGS: Mediastinum and hilar structures are normal. Prior CABG. Cardiomegaly with normal pulmonary vascularity. Interval near complete clearing of left lower lobe infiltrate. Remaining increased markings in the left lung base are most likely chronic related to pleural parenchymal scarring. No acute new infiltrate noted. No pleural effusion or pneumothorax. No acute bony abnormality. IMPRESSION: Interval near complete clearing of left lower lobe infiltrate. Persistent mild increased markings in the left lung base most likely related to pleural parenchymal scarring .  Electronically Signed   By: Marcello Moores  Register   On: 03/06/2015 12:10    Assessment & Plan:   Ralph Gray was seen today for annual exam, hypertension, hyperlipidemia and diabetes.  Diagnoses and all orders for this visit:  Essential hypertension- his blood pressure is well-controlled, his electrolytes are stable. -     Basic metabolic panel; Future  Need for influenza vaccination -     Flu Vaccine QUAD 36+ mos IM  Hyperlipidemia with target LDL less than 70- he has achieved his LDL goal is doing well on the statin. -     Lipid panel; Future  Chronic renal insufficiency, stage III (moderate)- his BUN is elevated and his creatinine has declined some, this is consistent with dehydration, I have asked him to increase his intake of liquids and will recheck his renal function next time -     Basic metabolic panel; Future  Atherosclerosis of native coronary artery of native heart without angina pectoris- he has had no recent episodes of angina, will continue risk factor modifications with statin  therapy, blood pressure control, and blood sugar control. -     Lipid panel; Future  Diabetes mellitus with peripheral vascular disease ([place])- he denies any recent episodes of claudication and there do not appear to be any complications today. -     Hemoglobin A1c; Future  I am having Ralph Gray maintain his aspirin, canagliflozin, metFORMIN, felodipine, montelukast, losartan, fluticasone, clotrimazole, bromocriptine, JANUVIA, atorvastatin, tamsulosin, pregabalin, warfarin, and folic acid-pyridoxine-cyancobalamin.  No orders of the defined types were placed in this encounter.     Follow-up: Return in about 6 months (around 02/08/2016).  [name], MD

## 2015-08-11 NOTE — Progress Notes (Signed)
Pre visit review using our clinic review tool, if applicable. No additional management support is needed unless otherwise documented below in the visit note. 

## 2015-08-12 NOTE — Assessment & Plan Note (Signed)

## 2015-08-14 NOTE — Telephone Encounter (Signed)
Late entry Pt called to inform clinic he went to ED on Sunday, diagnosed with pneumonia and pink eye. Given 7 day supply of Levaquin 500 mg. Pt now is having nose bleeds. Appt made for today.   By Thurnell Garbe, RN

## 2015-08-15 DIAGNOSIS — H43823 Vitreomacular adhesion, bilateral: Secondary | ICD-10-CM | POA: Diagnosis not present

## 2015-08-15 DIAGNOSIS — H353114 Nonexudative age-related macular degeneration, right eye, advanced atrophic with subfoveal involvement: Secondary | ICD-10-CM | POA: Diagnosis not present

## 2015-08-15 DIAGNOSIS — H35343 Macular cyst, hole, or pseudohole, bilateral: Secondary | ICD-10-CM | POA: Diagnosis not present

## 2015-08-20 ENCOUNTER — Other Ambulatory Visit: Payer: Self-pay | Admitting: Cardiovascular Disease

## 2015-08-20 DIAGNOSIS — I6523 Occlusion and stenosis of bilateral carotid arteries: Secondary | ICD-10-CM

## 2015-08-22 ENCOUNTER — Ambulatory Visit: Payer: Medicare Other | Admitting: Podiatry

## 2015-08-22 ENCOUNTER — Telehealth: Payer: Self-pay | Admitting: Endocrinology

## 2015-08-22 ENCOUNTER — Ambulatory Visit: Payer: Medicare Other | Admitting: Endocrinology

## 2015-08-22 NOTE — Telephone Encounter (Signed)
Patient no showed today's appt. Please advise on how to follow up. °A. No follow up necessary. °B. Follow up urgent. Contact patient immediately. °C. Follow up necessary. Contact patient and schedule visit in ___ days. °D. Follow up advised. Contact patient and schedule visit in ____weeks. ° °

## 2015-08-23 NOTE — Telephone Encounter (Signed)
No follow up necessary.  

## 2015-08-25 ENCOUNTER — Ambulatory Visit: Payer: Medicare Other | Admitting: Endocrinology

## 2015-08-27 ENCOUNTER — Ambulatory Visit (HOSPITAL_COMMUNITY)
Admission: RE | Admit: 2015-08-27 | Discharge: 2015-08-27 | Disposition: A | Discharge status: Home / Self Care | Payer: Medicare Other | Source: Ambulatory Visit | Attending: Cardiovascular Disease | Admitting: Cardiovascular Disease

## 2015-08-27 DIAGNOSIS — I10 Essential (primary) hypertension: Secondary | ICD-10-CM | POA: Insufficient documentation

## 2015-08-27 DIAGNOSIS — E785 Hyperlipidemia, unspecified: Secondary | ICD-10-CM | POA: Insufficient documentation

## 2015-08-27 DIAGNOSIS — E1140 Type 2 diabetes mellitus with diabetic neuropathy, unspecified: Secondary | ICD-10-CM | POA: Diagnosis not present

## 2015-08-27 DIAGNOSIS — I6523 Occlusion and stenosis of bilateral carotid arteries: Secondary | ICD-10-CM | POA: Insufficient documentation

## 2015-08-28 ENCOUNTER — Ambulatory Visit (INDEPENDENT_AMBULATORY_CARE_PROVIDER_SITE_OTHER): Payer: Medicare Other | Admitting: *Deleted

## 2015-08-28 DIAGNOSIS — Z5181 Encounter for therapeutic drug level monitoring: Secondary | ICD-10-CM | POA: Diagnosis not present

## 2015-08-28 DIAGNOSIS — I4892 Unspecified atrial flutter: Secondary | ICD-10-CM | POA: Diagnosis not present

## 2015-08-28 DIAGNOSIS — Z7901 Long term (current) use of anticoagulants: Secondary | ICD-10-CM

## 2015-08-28 DIAGNOSIS — I4891 Unspecified atrial fibrillation: Secondary | ICD-10-CM

## 2015-08-28 LAB — POCT INR: INR: 2.9

## 2015-08-29 ENCOUNTER — Telehealth: Payer: Self-pay | Admitting: Endocrinology

## 2015-08-29 ENCOUNTER — Encounter: Payer: Self-pay | Admitting: Endocrinology

## 2015-08-29 ENCOUNTER — Ambulatory Visit (INDEPENDENT_AMBULATORY_CARE_PROVIDER_SITE_OTHER): Payer: Medicare Other | Admitting: Endocrinology

## 2015-08-29 VITALS — BP 98/50 | HR 66 | Temp 97.5°F | Ht 71.0 in | Wt 216.0 lb

## 2015-08-29 DIAGNOSIS — I959 Hypotension, unspecified: Secondary | ICD-10-CM | POA: Diagnosis not present

## 2015-08-29 DIAGNOSIS — E1151 Type 2 diabetes mellitus with diabetic peripheral angiopathy without gangrene: Secondary | ICD-10-CM

## 2015-08-29 DIAGNOSIS — I6523 Occlusion and stenosis of bilateral carotid arteries: Secondary | ICD-10-CM | POA: Diagnosis not present

## 2015-08-29 MED ORDER — CANAGLIFLOZIN 100 MG PO TABS: 100.0000 mg | ORAL_TABLET | Freq: Every day | ORAL | Status: DC

## 2015-08-29 MED ORDER — LOSARTAN POTASSIUM 50 MG PO TABS
50.0000 mg | ORAL_TABLET | Freq: Every day | ORAL | Status: AC
Start: 2015-08-29 — End: ?

## 2015-08-29 NOTE — Progress Notes (Signed)
Subjective:    Patient ID: Ralph Gray, male    DOB: [birth year], 80 y.o.   MRN: [id number]  HPI Pt returns for f/u of diabetes mellitus:  DM type: 2 Dx'ed: [id number] Complications: polyneuropathy, foot ulcer, CAD, renal insufficiency, and PAD. Therapy: 4 oral meds.  DKA: never  Severe hypoglycemia: never Pancreatitis: never  Other: he has never been on insulin; he can't take actos due to edema. Interval history: no cbg record, but states cbg's are well-controlled.  Past Medical History  Diagnosis Date  . Hypertension   . Hyperlipidemia   . Peripheral neuropathy ([place])   . Macular degeneration BILATERAL    TX--- intra-orbital injections. HX  laser treatment  . Type II diabetes mellitus ([place])   . Bilateral carotid artery stenosis     PER DUPLEX 08-07-2013--  BILATERAL ICA  40-59% STENOSIS  . Anticoagulated on Coumadin   . History of prostate cancer     DX  2010--  S/P EXTERNAL RADIATION THERAPY  . Bladder tumor   . PAF (paroxysmal atrial fibrillation) ([place])     DX  2006  . First degree heart block   . Coronary artery disease CARDIOLOGIST --  DR Burt Knack    MI '99   S/P CABG  . History of MI (myocardial infarction)     1999  --  S/P CABG  . S/P CABG x 4     1999  IN [place]  . Cataract of both eyes     RESCHEDULED FOR EXTRACTIONS  MAY 2015  . Renal insufficiency   . Personal history of CLL (chronic lymphocytic leukemia)     PER LAB RESULTS IN 2010--  NO TX  ONLY BEING MONITORED  . History of hepatitis C virus infection     1990's  per lab results  --  asymptomatic ,  no tx  . Wears glasses   . At risk for sleep apnea     STOP-BANG=  5   //SENT TO PCP  02-06-2014  . Nocturia     Past Surgical History  Procedure Laterality Date  . Cardioversion  2008  . Cardiac catheterization  08-29-2012   DR COOPER    POST CABG--  PATENT LIMA to  LAD,  free RIMA  to acute marginal branch of the RCA,  &  SVG to DIAGONAL/  TOTAL OCCLUSION OF SVG to distal RCA/  MODERATE SEGMENTAL  LVSF/  80% STENOSIS  pCFX (which was not grafted)  . Transthoracic echocardiogram  08-01-2012  dr cooper    GRADE I DIASTOLIC DYSFUNCTION/  EF 55-60%/  MODERATE LAE/  AORTIC SCLEROSIS WITHOUT STENOSIS/  MILD TR  . Coronary artery bypass graft  1999    CABG X4;  in [place]  . Transurethral resection of bladder tumor N/A 02/08/2014    Procedure: TRANSURETHRAL RESECTION OF BLADDER TUMOR (TURBT) WITH GYRUS ;  Surgeon: [name], MD;  Location: [facility];  Service: Urology;  Laterality: N/A;    Social History   Social History  . Marital Status: Married    Spouse Name: N/A  . Number of Children: N/A  . Years of Education: N/A   Occupational History  . Retired    Social History Main Topics  . Smoking status: Former Smoker -- 2.50 packs/day for 9 years    Types: Cigarettes    Quit date: 10/14/1963  . Smokeless tobacco: Never Used  . Alcohol Use: No  . Drug Use: No  . Sexual Activity: Not on file  Other Topics Concern  . Not on file   Social History Narrative   HSG, [employer], [employer] - doctorate in education. Married '64 - 21yr/divorced ( severe bipolar disease); Married '07. 2 sons - '67, '66; 1 dtr - '70. 7 grandchildren. Exercise - 6 days a week: cardio and strengthening. 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 ACP- yes for CPR; no -long term ventilation; no heroic or futile measures.     Current Outpatient Prescriptions on File Prior to Visit  Medication Sig Dispense Refill  . aspirin 81 MG tablet Take 81 mg by mouth daily.      Marland Kitchen atorvastatin (LIPITOR) 10 MG tablet TAKE 1 TABLET EVERY EVENING 90 tablet 2  . bromocriptine (PARLODEL) 2.5 MG tablet Take 1 tablet (2.5 mg total) by mouth daily. 90 tablet 3  . clotrimazole (LOTRIMIN) 1 % cream Apply 1 application topically 2 (two) times daily. 30 g 2  . felodipine (PLENDIL) 5 MG 24 hr tablet Take 5 mg by mouth every morning.    . fluticasone (FLONASE) 50  MCG/ACT nasal spray USE 2 SPRAYS NASALLY DAILY 16 g 0  . folic acid-pyridoxine-cyancobalamin (FOLBIC) 2.5-25-2 MG TABS tablet Take 1 tablet by mouth daily. 90 each 3  . JANUVIA 100 MG tablet TAKE 1 TABLET DAILY 90 tablet 2  . metFORMIN (GLUCOPHAGE) 1000 MG tablet Take 1 tablet (1,000 mg total) by mouth daily. 90 tablet 3  . montelukast (SINGULAIR) 10 MG tablet Take 10 mg by mouth at bedtime.    . pregabalin (LYRICA) 75 MG capsule Take 1 capsule (75 mg total) by mouth 2 (two) times daily. 180 capsule 1  . tamsulosin (FLOMAX) 0.4 MG CAPS capsule Take 1 capsule (0.4 mg total) by mouth daily after supper. 90 capsule 3  . warfarin (COUMADIN) 6 MG tablet TAKE AS DIRECTED BY COUMADIN CLINIC 120 tablet 0   No current facility-administered medications on file prior to visit.    Allergies  Allergen Reactions  . Amoxicillin Anaphylaxis  . Other Other (See Comments)    Muscle Relaxers; "I get very woozy"    Family History  Problem Relation Age of Onset  . Heart disease Mother 78    died  . Breast cancer Mother     sarcoma  . Heart attack Father 81    died  . Heart disease Father     MI  . Diabetes Neg Hx   . COPD Neg Hx     BP 98/50 mmHg  Pulse 66  Temp(Src) 97.5 F (36.4 C) (Oral)  Ht 5\' 11"  (1.803 m)  Wt 216 lb (97.977 kg)  BMI 30.14 kg/m2  SpO2 97%    Review of Systems He denies hypoglycemia    Objective:   Physical Exam VITAL SIGNS:  See vs page.   GENERAL: no distress.  Pulses: dorsalis pedis intact bilat.  MSK: no deformity of the feet.  CV: 1+ bilat leg edema, and bilat varicosities.  Skin: no ulcer on the feet. normal temp on the feet, but there is spotty hyperpigmentation. Neuro: sensation is intact to touch on the feet.    Lab Results  Component Value Date   HGBA1C 6.7* 08/11/2015  (I discussed with Dr Burt Knack)    Assessment & Plan:  DM: well-controlled.  Please continue the same medications Hypotension, new to me.  Reduce losartan to 50 mg  qd.  Patient is advised the following: Patient Instructions  check your blood sugar once a day.  vary  the time of day when you check, between before the 3 meals, and at bedtime.  Checking the bedtime will help [place] understand why it is high in the morning.  also check if you have symptoms of your blood sugar being too high or too low.  please keep a record of the readings and bring it to your next appointment here.  please call us sooner if your blood sugar goes below 70, or if you have a lot of readings over 200.  Please continue the same medications.   Please come back for a follow-up appointment in 4 months.

## 2015-08-29 NOTE — Patient Instructions (Addendum)
check your blood sugar once a day.  vary the time of day when you check, between before the 3 meals, and at bedtime.  Checking the bedtime will help [place] understand why it is high in the morning.  also check if you have symptoms of your blood sugar being too high or too low.  please keep a record of the readings and bring it to your next appointment here.  please call us sooner if your blood sugar goes below 70, or if you have a lot of readings over 200.  Please continue the same medications.   Please come back for a follow-up appointment in 4 months.

## 2015-08-29 NOTE — Telephone Encounter (Signed)
please call patient: Dr Burt Knack advised reducing the losartan to 50 mg qd. he'll see you soon

## 2015-08-29 NOTE — Telephone Encounter (Signed)
I contacted the pt and advised of note below. Pt voiced understanding.  

## 2015-09-09 ENCOUNTER — Ambulatory Visit (INDEPENDENT_AMBULATORY_CARE_PROVIDER_SITE_OTHER): Payer: Medicare Other | Admitting: Podiatry

## 2015-09-09 ENCOUNTER — Encounter: Payer: Self-pay | Admitting: Podiatry

## 2015-09-09 DIAGNOSIS — M79673 Pain in unspecified foot: Secondary | ICD-10-CM

## 2015-09-09 DIAGNOSIS — E1151 Type 2 diabetes mellitus with diabetic peripheral angiopathy without gangrene: Secondary | ICD-10-CM

## 2015-09-09 DIAGNOSIS — B351 Tinea unguium: Secondary | ICD-10-CM

## 2015-09-09 NOTE — Progress Notes (Signed)
Patient ID: Ralph Gray, male   DOB: 09/02/1935, 79 y.o.   MRN: [id number]  Subjective: 79-year-old male returns the office today for painful, elongated, thickened toenails which he is unable to trim himself. Denies any redness or drainage around the nails. Denies any acute changes since last appointment and no new complaints today. Denies any systemic complaints such as fevers, chills, nausea, vomiting.   Objective: AAO 3, NAD DP/PT pulses palpable 1/4, CRT less than 3 seconds Protective sensation decreased with Simms Weinstein monofilament Nails hypertrophic, dystrophic, elongated, brittle, discolored 10. There is tenderness overlying the nails 1-5 bilaterally. There is no surrounding erythema or drainage along the nail sites. No open lesions or pre-ulcerative lesions are identified. No other areas of tenderness bilateral lower extremities. No overlying edema, erythema, increased warmth. No pain with calf compression, swelling, warmth, erythema.  Assessment: Patient presents with symptomatic onychomycosis  Plan: -Treatment options including alternatives, risks, complications were discussed -Nails sharply debrided 10 without complication/bleeding. -Discussed daily foot inspection. If there are any changes, to call the office immediately.  -Follow-up in 3 months or sooner if any problems are to arise. In the meantime, encouraged to call the office with any questions, concerns, changes symptoms.  [name], DPM 

## 2015-09-19 ENCOUNTER — Ambulatory Visit (INDEPENDENT_AMBULATORY_CARE_PROVIDER_SITE_OTHER): Payer: Medicare Other | Admitting: Cardiovascular Disease

## 2015-09-19 ENCOUNTER — Encounter: Payer: Self-pay | Admitting: Cardiovascular Disease

## 2015-09-19 VITALS — BP 126/82 | HR 53 | Ht 71.0 in | Wt 222.4 lb

## 2015-09-19 DIAGNOSIS — I4892 Unspecified atrial flutter: Secondary | ICD-10-CM

## 2015-09-19 DIAGNOSIS — R001 Bradycardia, unspecified: Secondary | ICD-10-CM | POA: Diagnosis not present

## 2015-09-19 DIAGNOSIS — I6523 Occlusion and stenosis of bilateral carotid arteries: Secondary | ICD-10-CM

## 2015-09-19 DIAGNOSIS — I10 Essential (primary) hypertension: Secondary | ICD-10-CM

## 2015-09-19 DIAGNOSIS — I4891 Unspecified atrial fibrillation: Secondary | ICD-10-CM | POA: Diagnosis not present

## 2015-09-19 NOTE — Progress Notes (Signed)
Cardiology Office Note Date:  09/20/2015   ID:  Ralph Gray, DOB 09-Apr-1935, MRN [id number]  PCP:  Ralph Calico, MD  Cardiologist:  Ralph Mocha, MD    Chief Complaint  Patient presents with  . Coronary Artery Disease    History of Present Illness: Ralph Gray is a 80 y.o. male with PMH as below who presents for follow-up.  Was seen at endocrinologist, Dr. Loanne Gray on 08/29/15 for DM and was noted to be hypotensive at 37/60.  He was advised to decrease losartan from 50mg  BID to 50mg  daily.  BP at his PCP on 08/11/15 was 100/58.  He does check his BP at home and reports average SBPs in 140s but he is not sure if his BP monitor works.  He denies fatigue, lightheadedness or syncope.   Doing well.  He reports occasional non-exertional chest pain associated with life stressors (ex-wife, now in the process of relocating with his new wife).  He denies exertional chest pain, palpitations, DOE, stroke-like symptoms, easy bruising or bleeding.  Going to gym 6 days per week for 2 hours per day.  Non-smoker (remote hx 50 years ago).  Compliant with medications.   Past Medical History  Diagnosis Date  . Hypertension   . Hyperlipidemia   . Peripheral neuropathy ([place])   . Macular degeneration BILATERAL    TX--- intra-orbital injections. HX  laser treatment  . Type II diabetes mellitus ([place])   . Bilateral carotid artery stenosis     PER DUPLEX 08-07-2013--  BILATERAL ICA  40-59% STENOSIS  . Anticoagulated on Coumadin   . History of prostate cancer     DX  2010--  S/P EXTERNAL RADIATION THERAPY  . Bladder tumor   . PAF (paroxysmal atrial fibrillation) ([place])     DX  2006  . First degree heart block   . Coronary artery disease CARDIOLOGIST --  DR Burt Knack    MI '99   S/P CABG  . History of MI (myocardial infarction)     1999  --  S/P CABG  . S/P CABG x 4     1999  IN [place]  . Cataract of both eyes     RESCHEDULED FOR EXTRACTIONS  MAY 2015  . Renal insufficiency   . Personal  history of CLL (chronic lymphocytic leukemia)     PER LAB RESULTS IN 2010--  NO TX  ONLY BEING MONITORED  . History of hepatitis C virus infection     1990's  per lab results  --  asymptomatic ,  no tx  . Wears glasses   . At risk for sleep apnea     STOP-BANG=  5   //SENT TO PCP  02-06-2014  . Nocturia     Past Surgical History  Procedure Laterality Date  . Cardioversion  2008  . Cardiac catheterization  08-29-2012   DR Ralph Gray    POST CABG--  PATENT LIMA to  LAD,  free RIMA  to acute marginal branch of the RCA,  &  SVG to DIAGONAL/  TOTAL OCCLUSION OF SVG to distal RCA/  MODERATE SEGMENTAL LVSF/  80% STENOSIS  pCFX (which was not grafted)  . Transthoracic echocardiogram  08-01-2012  dr [name]    GRADE I DIASTOLIC DYSFUNCTION/  EF 55-60%/  MODERATE LAE/  AORTIC SCLEROSIS WITHOUT STENOSIS/  MILD TR  . Coronary artery bypass graft  1999    CABG X4;  in [place]  . Transurethral resection of bladder tumor N/A 02/08/2014    Procedure:  TRANSURETHRAL RESECTION OF BLADDER TUMOR (TURBT) WITH GYRUS ;  Surgeon: Ralph Jabs, MD;  Location: [facility];  Service: Urology;  Laterality: N/A;    Current Outpatient Prescriptions  Medication Sig Dispense Refill  . aspirin 81 MG tablet Take 81 mg by mouth daily.      Marland Kitchen atorvastatin (LIPITOR) 10 MG tablet Take 10 mg by mouth daily.    . bromocriptine (PARLODEL) 2.5 MG tablet Take 1 tablet (2.5 mg total) by mouth daily. 90 tablet 3  . canagliflozin (INVOKANA) 100 MG TABS tablet Take 1 tablet (100 mg total) by mouth daily. 90 tablet 3  . clotrimazole (LOTRIMIN) 1 % cream Apply 1 application topically 2 (two) times daily. 30 g 2  . felodipine (PLENDIL) 5 MG 24 hr tablet Take 5 mg by mouth every morning.    . fluticasone (FLONASE) 50 MCG/ACT nasal spray USE 2 SPRAYS NASALLY DAILY 16 g 0  . folic acid-pyridoxine-cyancobalamin (FOLBIC) 2.5-25-2 MG TABS tablet Take 1 tablet by mouth daily. 90 each 3  . losartan (COZAAR) 50 MG tablet Take 1  tablet (50 mg total) by mouth daily. 90 tablet 3  . metFORMIN (GLUCOPHAGE) 1000 MG tablet Take 1 tablet (1,000 mg total) by mouth daily. 90 tablet 3  . montelukast (SINGULAIR) 10 MG tablet Take 10 mg by mouth at bedtime.    . pregabalin (LYRICA) 75 MG capsule Take 1 capsule (75 mg total) by mouth 2 (two) times daily. 180 capsule 1  . sitaGLIPtin (JANUVIA) 100 MG tablet Take 100 mg by mouth daily.    . tamsulosin (FLOMAX) 0.4 MG CAPS capsule Take 1 capsule (0.4 mg total) by mouth daily after supper. 90 capsule 3  . warfarin (COUMADIN) 6 MG tablet TAKE AS DIRECTED BY COUMADIN CLINIC 120 tablet 0   No current facility-administered medications for this visit.    Allergies:   Amoxicillin and Other   Social History:  The patient  reports that he quit smoking about 51 years ago. His smoking use included Cigarettes. He has a 22.5 pack-year smoking history. He has never used smokeless tobacco. He reports that he does not drink alcohol or use illicit drugs.   Family History:  The patient's family history includes Breast cancer in his mother; Heart attack (age of onset: 31) in his father; Heart disease in his father; Heart disease (age of onset: 29) in his mother. There is no history of Diabetes or COPD.    ROS:  Please see the history of present illness.  ROS negative for fever/chills, appetite change, unexplained weight loss, diarrhea, difficulty urinating.   PHYSICAL EXAM: VS:  BP 126/82 mmHg  Pulse 53  Ht 5\' 11"  (1.803 m)  Wt 222 lb 6.4 oz (100.88 kg)  BMI 31.03 kg/m2 , BMI Body mass index is 31.03 kg/(m^2). GEN: Well nourished, well developed, in no acute distress HEENT: normal Neck: no JVD, no masses. No carotid bruits Cardiac: RRR without murmur or gallop                Respiratory:  clear to auscultation bilaterally, normal work of breathing GI: soft, nontender, obese, + BS MS: no deformity or atrophy Ext: 1+ B/L pretibial edema, pedal pulses 2+bilaterally Skin: warm and dry, no rash;  stasis dermatitis B/L LE  Neuro:  Strength and sensation are intact Psych: euthymic mood, full affect  EKG:  EKG is ordered today. The ekg ordered today shows sinus rhythm with 1st degree AV block and PACs.  There is a  period of atrial tach with AV block. LAFB is present.  Recent Labs: 02/16/2015: B Natriuretic Peptide 383.9* 03/06/2015: Hemoglobin 14.5; Platelets 183.0; Pro B Natriuretic peptide (BNP) 435.0*; TSH 1.15 08/11/2015: BUN 30*; Creatinine, Ser 1.71*; Potassium 4.8; Sodium 139   Lipid Panel     Component Value Date/Time   CHOL 126 08/11/2015 0950   TRIG 128.0 08/11/2015 0950   HDL 40.80 08/11/2015 0950   CHOLHDL 3 08/11/2015 0950   VLDL 25.6 08/11/2015 0950   LDLCALC 60 08/11/2015 0950   LDLDIRECT 59.4 07/11/2013 1551    Wt Readings from Last 3 Encounters:  09/19/15 222 lb 6.4 oz (100.88 kg)  08/29/15 216 lb (97.977 kg)  08/11/15 213 lb (96.616 kg)     Cardiac Studies Reviewed: No new studies to review.  ASSESSMENT AND PLAN:  1. Bradycardia/AV block on EKG: not on AV nodal blocker.  Asymptomatic.  Reviewed with EP who thinks there may be an additional foci driving them but feels this is likely benign.  Will set up for 48-hour holter monitor.  2.  CAD, native vessel: reports occasional non-exertional chest pain he feels is associated with stress related to ex-wife and anticipated move to another state.  Exercising 12 hours total per week without any symptoms.  Non-smoker.  Recent LDL 60 on Lipitor 10mg  daily w/o ADRs.  DM under good control, A1c 6.7.  Will continue current regimen - ASA, Lipitor, BP and DM control, exercise.  3. Essential hypertension: recent episodes of asymptomatic hypotension.  BP well controlled today with decreased dose of losartan.  Will continue losartan 50mg  daily.  4. Paroxysmal Atrial Fibrillation:  Currently in sinus rhythm and rate controlled (actually bradycardic).  No stroke-like symptoms.  CHADSVASc score is 5.  Tolerating A/C with  coumadin; no bleeding.  INR therapeutic.  5. Carotid stenosis:  November 2015 duplex showed 40-59% B/L ICA stenosis.  No bruits on exam.  Will advise he repeat duplex next year since he will moving soon.  6. Hyperlipidemia:  LDL 60. Tolerating statin.  Continue Lipitor 10mg  daily.  Current medicines are reviewed with the patient today.  The patient does not have concerns regarding medicines.  Labs/ tests ordered today include: 48 hour holter monitor  Orders Placed This Encounter  Procedures  . Holter monitor - 48 hour  . EKG 12-Lead   Disposition:   No follow-up scheduled because the patient will be moving to [place] for his wife's new job.    The patient is seen today with Dr Redmond Pulling. See her documentation above. I made changes where appropriate. He is personally examined and we formulated the plan together. EKG demonstrates atrial tachycardia with block, then normal sinus. He is asymptomatic without palpitations, lightheadedness, or syncope. Will arrange a Holter monitor to make sure there is no high-grade AV block present.   Deatra James, MD  09/20/2015 5:52 AM    [facility] [address], [place], [place]  [address] Phone: [phone]; Fax: [phone]

## 2015-09-19 NOTE — Patient Instructions (Signed)
Medication Instructions:   Your physician recommends that you continue on your current medications as directed. Please refer to the Current Medication list given to you today.    If you need a refill on your cardiac medications before your next appointment, please call your pharmacy.  Labwork: NONE ORDER TODAY    Testing/Procedures: Your physician has recommended that you wear a holter monitor. Holter monitors are medical devices that record the heart's electrical activity. Doctors most often use these monitors to diagnose arrhythmias. Arrhythmias are problems with the speed or rhythm of the heartbeat. The monitor is a small, portable device. You can wear one while you do your normal daily activities. This is usually used to diagnose what is causing palpitations/syncope (passing out).     Follow-Up:  NONE NEEDED RELOCATING   Any Other Special Instructions Will Be Listed Below (If Applicable).

## 2015-09-24 ENCOUNTER — Other Ambulatory Visit: Payer: Self-pay | Admitting: Cardiovascular Disease

## 2015-09-24 DIAGNOSIS — I4891 Unspecified atrial fibrillation: Secondary | ICD-10-CM

## 2015-09-24 DIAGNOSIS — R001 Bradycardia, unspecified: Secondary | ICD-10-CM

## 2015-09-24 DIAGNOSIS — I440 Atrioventricular block, first degree: Secondary | ICD-10-CM

## 2015-09-25 ENCOUNTER — Ambulatory Visit: Payer: Medicare Other

## 2015-09-29 ENCOUNTER — Ambulatory Visit (INDEPENDENT_AMBULATORY_CARE_PROVIDER_SITE_OTHER): Payer: Medicare Other

## 2015-09-29 DIAGNOSIS — I4891 Unspecified atrial fibrillation: Secondary | ICD-10-CM | POA: Diagnosis not present

## 2015-09-29 DIAGNOSIS — I440 Atrioventricular block, first degree: Secondary | ICD-10-CM

## 2015-09-29 DIAGNOSIS — R001 Bradycardia, unspecified: Secondary | ICD-10-CM

## 2015-10-02 ENCOUNTER — Telehealth: Payer: Self-pay | Admitting: Cardiovascular Disease

## 2015-10-02 NOTE — Telephone Encounter (Signed)
New message      Pt turned in his holter monitor yesterday.  He want Lauren to call him on his cell phone as soon as Dr Burt Knack reviews the results.  Pt is aware Dr Burt Knack is out of the office until next week

## 2015-10-07 NOTE — Telephone Encounter (Signed)
Follow Up  Pt called. Req results for Holter monitor

## 2015-10-07 NOTE — Telephone Encounter (Signed)
I spoke with the pt and made him aware that Dr Burt Knack will not be available to review monitor until tomorrow.  The pt would like a phone call in regards to results tomorrow or he would like to see Dr Burt Knack in the office on Thursday.  I made the pt aware that we will attempt to follow up with him tomorrow.

## 2015-10-09 NOTE — Telephone Encounter (Signed)
I spoke with the pt and made him aware of holter monitor results. The pt does want to continue to follow up with Dr Burt Knack even after he moves to [place]. I made the pt aware that we will put a recall in for 6 months with Dr Burt Knack. The pt is due for carotid duplex and he will contact the office after he settles down from his move and we can arrange at the pt's convenience.

## 2015-10-10 ENCOUNTER — Ambulatory Visit (INDEPENDENT_AMBULATORY_CARE_PROVIDER_SITE_OTHER): Payer: Medicare Other | Admitting: *Deleted

## 2015-10-10 DIAGNOSIS — Z7901 Long term (current) use of anticoagulants: Secondary | ICD-10-CM

## 2015-10-10 DIAGNOSIS — I4891 Unspecified atrial fibrillation: Secondary | ICD-10-CM | POA: Diagnosis not present

## 2015-10-10 DIAGNOSIS — I4892 Unspecified atrial flutter: Secondary | ICD-10-CM

## 2015-10-10 DIAGNOSIS — Z5181 Encounter for therapeutic drug level monitoring: Secondary | ICD-10-CM | POA: Diagnosis not present

## 2015-10-10 LAB — POCT INR: INR: 2.5

## 2015-10-11 ENCOUNTER — Other Ambulatory Visit: Payer: Self-pay | Admitting: Internal Medicine

## 2015-10-27 DIAGNOSIS — Z0000 Encounter for general adult medical examination without abnormal findings: Secondary | ICD-10-CM | POA: Diagnosis not present

## 2015-10-27 DIAGNOSIS — Q6100 Congenital renal cyst, unspecified: Secondary | ICD-10-CM | POA: Diagnosis not present

## 2015-10-27 DIAGNOSIS — N99111 Postprocedural bulbous urethral stricture: Secondary | ICD-10-CM | POA: Diagnosis not present

## 2015-10-31 ENCOUNTER — Other Ambulatory Visit: Payer: Self-pay | Admitting: Internal Medicine

## 2015-11-21 ENCOUNTER — Ambulatory Visit: Payer: Medicare Other | Admitting: Podiatry

## 2015-12-03 DIAGNOSIS — Z5181 Encounter for therapeutic drug level monitoring: Secondary | ICD-10-CM | POA: Diagnosis not present

## 2015-12-03 DIAGNOSIS — M79674 Pain in right toe(s): Secondary | ICD-10-CM | POA: Diagnosis not present

## 2015-12-03 DIAGNOSIS — M79675 Pain in left toe(s): Secondary | ICD-10-CM | POA: Diagnosis not present

## 2015-12-03 DIAGNOSIS — Z7901 Long term (current) use of anticoagulants: Secondary | ICD-10-CM | POA: Diagnosis not present

## 2015-12-03 DIAGNOSIS — B351 Tinea unguium: Secondary | ICD-10-CM | POA: Diagnosis not present

## 2015-12-03 DIAGNOSIS — I4891 Unspecified atrial fibrillation: Secondary | ICD-10-CM | POA: Diagnosis not present

## 2015-12-03 DIAGNOSIS — E1141 Type 2 diabetes mellitus with diabetic mononeuropathy: Secondary | ICD-10-CM | POA: Diagnosis not present

## 2015-12-03 DIAGNOSIS — G5791 Unspecified mononeuropathy of right lower limb: Secondary | ICD-10-CM | POA: Diagnosis not present

## 2015-12-03 DIAGNOSIS — G5792 Unspecified mononeuropathy of left lower limb: Secondary | ICD-10-CM | POA: Diagnosis not present

## 2015-12-16 DIAGNOSIS — Z5181 Encounter for therapeutic drug level monitoring: Secondary | ICD-10-CM | POA: Diagnosis not present

## 2015-12-16 DIAGNOSIS — Z7901 Long term (current) use of anticoagulants: Secondary | ICD-10-CM | POA: Diagnosis not present

## 2015-12-16 DIAGNOSIS — I4891 Unspecified atrial fibrillation: Secondary | ICD-10-CM | POA: Diagnosis not present

## 2015-12-22 NOTE — Progress Notes (Signed)
Subjective:    Patient ID: Ralph Gray, male    DOB: July 08, 1935, 81 y.o.   MRN: [id number]  HPI Pt returns for f/u of diabetes mellitus:  DM type: 2 Dx'ed: [id number] Complications: polyneuropathy, foot ulcer, CAD, renal insufficiency, and PAD. Therapy: 4 oral meds.  DKA: never  Severe hypoglycemia: never Pancreatitis: never  Other: he has never been on insulin; he can't take pioglitizone due to edema.   Interval history: no cbg record, but states cbg's are well-controlled. He will move to [place] soon.   Past Medical History  Diagnosis Date  . Hypertension   . Hyperlipidemia   . Peripheral neuropathy ([place])   . Macular degeneration BILATERAL    TX--- intra-orbital injections. HX  laser treatment  . Type II diabetes mellitus ([place])   . Bilateral carotid artery stenosis     PER DUPLEX 08-07-2013--  BILATERAL ICA  40-59% STENOSIS  . Anticoagulated on Coumadin   . History of prostate cancer     DX  2010--  S/P EXTERNAL RADIATION THERAPY  . Bladder tumor   . PAF (paroxysmal atrial fibrillation) ([place])     DX  2006  . First degree heart block   . Coronary artery disease CARDIOLOGIST --  DR Burt Knack    MI '99   S/P CABG  . History of MI (myocardial infarction)     1999  --  S/P CABG  . S/P CABG x 4     [place]  IN [place]  . Cataract of both eyes     RESCHEDULED FOR EXTRACTIONS  MAY 2015  . Renal insufficiency   . Personal history of CLL (chronic lymphocytic leukemia)     PER LAB RESULTS IN 2010--  NO TX  ONLY BEING MONITORED  . History of hepatitis C virus infection     1990's  per lab results  --  asymptomatic ,  no tx  . Wears glasses   . At risk for sleep apnea     STOP-BANG=  5   //SENT TO PCP  02-06-2014  . Nocturia     Past Surgical History  Procedure Laterality Date  . Cardioversion  2008  . Cardiac catheterization  08-29-2012   DR COOPER    POST CABG--  PATENT LIMA to  LAD,  free RIMA  to acute marginal branch of the RCA,  &  SVG to DIAGONAL/  TOTAL OCCLUSION OF  SVG to distal RCA/  MODERATE SEGMENTAL LVSF/  80% STENOSIS  pCFX (which was not grafted)  . Transthoracic echocardiogram  08-01-2012  dr cooper    GRADE I DIASTOLIC DYSFUNCTION/  EF 55-60%/  MODERATE LAE/  AORTIC SCLEROSIS WITHOUT STENOSIS/  MILD TR  . Coronary artery bypass graft  1999    CABG X4;  in [place]  . Transurethral resection of bladder tumor N/A 02/08/2014    Procedure: TRANSURETHRAL RESECTION OF BLADDER TUMOR (TURBT) WITH GYRUS ;  Surgeon: [name], MD;  Location: [facility];  Service: Urology;  Laterality: N/A;    Social History   Social History  . Marital Status: Married    Spouse Name: N/A  . Number of Children: N/A  . Years of Education: N/A   Occupational History  . Retired    Social History Main Topics  . Smoking status: Former Smoker -- 2.50 packs/day for 9 years    Types: Cigarettes    Quit date: 10/14/1963  . Smokeless tobacco: Never Used  . Alcohol Use: No  . Drug  Use: No  . Sexual Activity: Not on file   Other Topics Concern  . Not on file   Social History Narrative   HSG, [employer], [place] - doctorate in education. Married '64 - 45yr/divorced ( severe bipolar disease); Married '07. 2 sons - '67, '66; 1 dtr - '70. 7 grandchildren. Exercise - 6 days a week: cardio and strengthening. 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 ACP- yes for CPR; no -long term ventilation; no heroic or futile measures.     Current Outpatient Prescriptions on File Prior to Visit  Medication Sig Dispense Refill  . aspirin 81 MG tablet Take 81 mg by mouth daily.      Marland Kitchen atorvastatin (LIPITOR) 10 MG tablet Take 10 mg by mouth daily.    . bromocriptine (PARLODEL) 2.5 MG tablet Take 1 tablet (2.5 mg total) by mouth daily. 90 tablet 3  . canagliflozin (INVOKANA) 100 MG TABS tablet Take 1 tablet (100 mg total) by mouth daily. 90 tablet 3  . clotrimazole (LOTRIMIN) 1 % cream Apply 1 application topically 2 (two)  times daily. 30 g 2  . felodipine (PLENDIL) 5 MG 24 hr tablet Take 5 mg by mouth every morning.    . fluticasone (FLONASE) 50 MCG/ACT nasal spray USE 2 SPRAYS NASALLY DAILY 16 g 0  . folic acid-pyridoxine-cyancobalamin (FOLBIC) 2.5-25-2 MG TABS tablet Take 1 tablet by mouth daily. 90 each 3  . losartan (COZAAR) 50 MG tablet Take 1 tablet (50 mg total) by mouth daily. 90 tablet 3  . metFORMIN (GLUCOPHAGE) 1000 MG tablet Take 1 tablet (1,000 mg total) by mouth daily. 90 tablet 3  . montelukast (SINGULAIR) 10 MG tablet TAKE 1 TABLET AT BEDTIME 90 tablet 3  . pregabalin (LYRICA) 75 MG capsule Take 1 capsule (75 mg total) by mouth 2 (two) times daily. 180 capsule 1  . sitaGLIPtin (JANUVIA) 100 MG tablet Take 100 mg by mouth daily.    . tamsulosin (FLOMAX) 0.4 MG CAPS capsule Take 1 capsule (0.4 mg total) by mouth daily after supper. 90 capsule 3  . warfarin (COUMADIN) 6 MG tablet TAKE AS DIRECTED BY COUMADIN CLINIC 120 tablet 1   No current facility-administered medications on file prior to visit.    Allergies  Allergen Reactions  . Amoxicillin Anaphylaxis  . Other Other (See Comments)    Muscle Relaxers; "I get very woozy"    Family History  Problem Relation Age of Onset  . Heart disease Mother 108    died  . Breast cancer Mother     sarcoma  . Heart attack Father 31    died  . Heart disease Father     MI  . Diabetes Neg Hx   . COPD Neg Hx     BP 120/68 mmHg  Pulse 55  Temp(Src) 97.9 F (36.6 C) (Oral)  Resp 20  Ht 5\' 11"  (1.803 m)  Wt 221 lb 4 oz (100.358 kg)  BMI 30.87 kg/m2  SpO2 96%  Review of Systems He denies hypoglycemia.      Objective:   Physical Exam VITAL SIGNS: See vs page.  GENERAL: no distress.  Pulses: dorsalis pedis intact bilat.  MSK: no deformity of the feet.  CV: 2+ bilat leg edema, and bilat varicosities.  Skin: no ulcer on the feet. normal temp on the feet, but there is spotty hyperpigmentation. Neuro: sensation is intact to touch on  the feet.    Lab Results  Component Value Date  HGBA1C 6.7 12/23/2015      Assessment & Plan:  DM: well-controlled  Patient is advised the following: Patient Instructions  check your blood sugar once a day.  vary the time of day when you check, between before the 3 meals, and at bedtime.  Checking the bedtime will help [place] understand why it is high in the morning.  also check if you have symptoms of your blood sugar being too high or too low.  please keep a record of the readings and bring it to your next appointment here.  please call us sooner if your blood sugar goes below 70, or if you have a lot of readings over 200.  Please continue the same medications.   Best wishes for your move to [place].

## 2015-12-22 NOTE — Patient Instructions (Addendum)
check your blood sugar once a day.  vary the time of day when you check, between before the 3 meals, and at bedtime.  Checking the bedtime will help [place] understand why it is high in the morning.  also check if you have symptoms of your blood sugar being too high or too low.  please keep a record of the readings and bring it to your next appointment here.  please call us sooner if your blood sugar goes below 70, or if you have a lot of readings over 200.  Please continue the same medications.   Best wishes for your move to [place].

## 2015-12-23 ENCOUNTER — Ambulatory Visit (INDEPENDENT_AMBULATORY_CARE_PROVIDER_SITE_OTHER): Payer: Medicare Other | Admitting: Endocrinology

## 2015-12-23 ENCOUNTER — Encounter: Payer: Self-pay | Admitting: Endocrinology

## 2015-12-23 DIAGNOSIS — E1151 Type 2 diabetes mellitus with diabetic peripheral angiopathy without gangrene: Secondary | ICD-10-CM | POA: Diagnosis not present

## 2015-12-23 LAB — POCT GLYCOSYLATED HEMOGLOBIN (HGB A1C): HEMOGLOBIN A1C: 6.7

## 2015-12-23 MED ORDER — CLOTRIMAZOLE 1 % EX CREA: 1.0000 "application " | TOPICAL_CREAM | Freq: Two times a day (BID) | CUTANEOUS | Status: AC

## 2015-12-23 NOTE — Progress Notes (Signed)
Pre visit review using our clinic review tool, if applicable. No additional management support is needed unless otherwise documented below in the visit note. 

## 2015-12-24 ENCOUNTER — Ambulatory Visit: Payer: Self-pay | Admitting: Pharmacist

## 2015-12-24 DIAGNOSIS — I4892 Unspecified atrial flutter: Secondary | ICD-10-CM

## 2015-12-24 DIAGNOSIS — I4891 Unspecified atrial fibrillation: Secondary | ICD-10-CM

## 2015-12-24 DIAGNOSIS — Z5181 Encounter for therapeutic drug level monitoring: Secondary | ICD-10-CM

## 2015-12-26 ENCOUNTER — Ambulatory Visit: Payer: Medicare Other | Admitting: Endocrinology

## 2016-01-08 DIAGNOSIS — C679 Malignant neoplasm of bladder, unspecified: Secondary | ICD-10-CM | POA: Diagnosis not present

## 2016-01-08 DIAGNOSIS — E1165 Type 2 diabetes mellitus with hyperglycemia: Secondary | ICD-10-CM | POA: Diagnosis not present

## 2016-01-08 DIAGNOSIS — I2510 Atherosclerotic heart disease of native coronary artery without angina pectoris: Secondary | ICD-10-CM | POA: Diagnosis not present

## 2016-01-08 DIAGNOSIS — I4891 Unspecified atrial fibrillation: Secondary | ICD-10-CM | POA: Diagnosis not present

## 2016-01-08 DIAGNOSIS — H3530 Unspecified macular degeneration: Secondary | ICD-10-CM | POA: Diagnosis not present

## 2016-01-15 DIAGNOSIS — Z5181 Encounter for therapeutic drug level monitoring: Secondary | ICD-10-CM | POA: Diagnosis not present

## 2016-01-15 DIAGNOSIS — Z7901 Long term (current) use of anticoagulants: Secondary | ICD-10-CM | POA: Diagnosis not present

## 2016-01-15 DIAGNOSIS — I4891 Unspecified atrial fibrillation: Secondary | ICD-10-CM | POA: Diagnosis not present

## 2016-01-20 ENCOUNTER — Other Ambulatory Visit: Payer: Self-pay | Admitting: Internal Medicine

## 2016-01-20 ENCOUNTER — Other Ambulatory Visit: Payer: Self-pay | Admitting: Endocrinology

## 2016-01-22 NOTE — Telephone Encounter (Signed)
Please advise on rf

## 2016-02-11 DIAGNOSIS — H35073 Retinal telangiectasis, bilateral: Secondary | ICD-10-CM | POA: Diagnosis not present

## 2016-02-11 DIAGNOSIS — H353232 Exudative age-related macular degeneration, bilateral, with inactive choroidal neovascularization: Secondary | ICD-10-CM | POA: Diagnosis not present

## 2016-02-11 DIAGNOSIS — E119 Type 2 diabetes mellitus without complications: Secondary | ICD-10-CM | POA: Diagnosis not present

## 2016-02-11 DIAGNOSIS — H43813 Vitreous degeneration, bilateral: Secondary | ICD-10-CM | POA: Diagnosis not present

## 2016-02-12 ENCOUNTER — Other Ambulatory Visit: Payer: Self-pay | Admitting: Endocrinology

## 2016-02-12 DIAGNOSIS — Z7901 Long term (current) use of anticoagulants: Secondary | ICD-10-CM | POA: Diagnosis not present

## 2016-02-12 DIAGNOSIS — Z5181 Encounter for therapeutic drug level monitoring: Secondary | ICD-10-CM | POA: Diagnosis not present

## 2016-02-12 DIAGNOSIS — I4891 Unspecified atrial fibrillation: Secondary | ICD-10-CM | POA: Diagnosis not present

## 2016-02-12 NOTE — Telephone Encounter (Signed)
Please advise if ok to refill. Rx is listed under a historical provider. Thanks! 

## 2016-02-13 DIAGNOSIS — B351 Tinea unguium: Secondary | ICD-10-CM | POA: Diagnosis not present

## 2016-02-13 DIAGNOSIS — M79674 Pain in right toe(s): Secondary | ICD-10-CM | POA: Diagnosis not present

## 2016-02-13 DIAGNOSIS — E1141 Type 2 diabetes mellitus with diabetic mononeuropathy: Secondary | ICD-10-CM | POA: Diagnosis not present

## 2016-02-13 DIAGNOSIS — G5791 Unspecified mononeuropathy of right lower limb: Secondary | ICD-10-CM | POA: Diagnosis not present

## 2016-02-13 DIAGNOSIS — M79675 Pain in left toe(s): Secondary | ICD-10-CM | POA: Diagnosis not present

## 2016-02-13 DIAGNOSIS — G5792 Unspecified mononeuropathy of left lower limb: Secondary | ICD-10-CM | POA: Diagnosis not present

## 2016-02-16 ENCOUNTER — Telehealth: Payer: Self-pay | Admitting: Endocrinology

## 2016-02-16 NOTE — Telephone Encounter (Signed)
As we have POC a1c's, this would be an ov, which is fine with me.

## 2016-02-16 NOTE — Telephone Encounter (Signed)
Please send a1c to dr Karsten [facility]

## 2016-02-16 NOTE — Telephone Encounter (Signed)
PT would like to know if he can come in on 02/23/16 for a Hemoglobin A1C test

## 2016-02-16 NOTE — Telephone Encounter (Signed)
See note below and please advise, Thanks! 

## 2016-02-16 NOTE — Telephone Encounter (Signed)
I contacted the pt and advised of note below. Pt voiced understanding and stated he would call back to schedule office visit.

## 2016-02-17 NOTE — Telephone Encounter (Signed)
A1C result sent per pt's request.

## 2016-02-23 DIAGNOSIS — Z8546 Personal history of malignant neoplasm of prostate: Secondary | ICD-10-CM | POA: Diagnosis not present

## 2016-02-23 DIAGNOSIS — Z8551 Personal history of malignant neoplasm of bladder: Secondary | ICD-10-CM | POA: Diagnosis not present

## 2016-02-23 DIAGNOSIS — N99111 Postprocedural bulbous urethral stricture: Secondary | ICD-10-CM | POA: Diagnosis not present

## 2016-02-23 DIAGNOSIS — Z0000 Encounter for general adult medical examination without abnormal findings: Secondary | ICD-10-CM | POA: Diagnosis not present

## 2016-02-26 DIAGNOSIS — M79674 Pain in right toe(s): Secondary | ICD-10-CM | POA: Diagnosis not present

## 2016-02-26 DIAGNOSIS — E1141 Type 2 diabetes mellitus with diabetic mononeuropathy: Secondary | ICD-10-CM | POA: Diagnosis not present

## 2016-02-26 DIAGNOSIS — G5791 Unspecified mononeuropathy of right lower limb: Secondary | ICD-10-CM | POA: Diagnosis not present

## 2016-02-26 DIAGNOSIS — L89891 Pressure ulcer of other site, stage 1: Secondary | ICD-10-CM | POA: Diagnosis not present

## 2016-03-01 ENCOUNTER — Other Ambulatory Visit: Payer: Self-pay | Admitting: Internal Medicine

## 2016-03-09 DIAGNOSIS — R600 Localized edema: Secondary | ICD-10-CM | POA: Diagnosis not present

## 2016-03-09 DIAGNOSIS — I4891 Unspecified atrial fibrillation: Secondary | ICD-10-CM | POA: Diagnosis not present

## 2016-03-10 DIAGNOSIS — M7989 Other specified soft tissue disorders: Secondary | ICD-10-CM | POA: Diagnosis not present

## 2016-03-18 DIAGNOSIS — Z5181 Encounter for therapeutic drug level monitoring: Secondary | ICD-10-CM | POA: Diagnosis not present

## 2016-03-18 DIAGNOSIS — Z7901 Long term (current) use of anticoagulants: Secondary | ICD-10-CM | POA: Diagnosis not present

## 2016-03-18 DIAGNOSIS — I4891 Unspecified atrial fibrillation: Secondary | ICD-10-CM | POA: Diagnosis not present

## 2016-03-19 DIAGNOSIS — L89891 Pressure ulcer of other site, stage 1: Secondary | ICD-10-CM | POA: Diagnosis not present

## 2016-03-19 DIAGNOSIS — M79674 Pain in right toe(s): Secondary | ICD-10-CM | POA: Diagnosis not present

## 2016-03-19 DIAGNOSIS — G5791 Unspecified mononeuropathy of right lower limb: Secondary | ICD-10-CM | POA: Diagnosis not present

## 2016-03-19 DIAGNOSIS — E1141 Type 2 diabetes mellitus with diabetic mononeuropathy: Secondary | ICD-10-CM | POA: Diagnosis not present

## 2016-03-22 ENCOUNTER — Ambulatory Visit: Payer: Medicare Other | Admitting: Endocrinology

## 2016-03-24 ENCOUNTER — Encounter: Payer: Self-pay | Admitting: Cardiovascular Disease

## 2016-03-24 DIAGNOSIS — R609 Edema, unspecified: Secondary | ICD-10-CM | POA: Diagnosis not present

## 2016-03-24 DIAGNOSIS — E119 Type 2 diabetes mellitus without complications: Secondary | ICD-10-CM | POA: Diagnosis not present

## 2016-03-24 DIAGNOSIS — I4891 Unspecified atrial fibrillation: Secondary | ICD-10-CM | POA: Diagnosis not present

## 2016-03-24 DIAGNOSIS — I10 Essential (primary) hypertension: Secondary | ICD-10-CM | POA: Diagnosis not present

## 2016-03-24 DIAGNOSIS — I4892 Unspecified atrial flutter: Secondary | ICD-10-CM | POA: Diagnosis not present

## 2016-03-24 DIAGNOSIS — I428 Other cardiomyopathies: Secondary | ICD-10-CM | POA: Diagnosis not present

## 2016-03-24 DIAGNOSIS — R5383 Other fatigue: Secondary | ICD-10-CM | POA: Diagnosis not present

## 2016-03-25 DIAGNOSIS — I4892 Unspecified atrial flutter: Secondary | ICD-10-CM | POA: Diagnosis not present

## 2016-03-25 DIAGNOSIS — I2510 Atherosclerotic heart disease of native coronary artery without angina pectoris: Secondary | ICD-10-CM | POA: Diagnosis not present

## 2016-03-28 DIAGNOSIS — I4892 Unspecified atrial flutter: Secondary | ICD-10-CM | POA: Diagnosis not present

## 2016-03-28 DIAGNOSIS — I2510 Atherosclerotic heart disease of native coronary artery without angina pectoris: Secondary | ICD-10-CM | POA: Diagnosis not present

## 2016-03-31 ENCOUNTER — Telehealth: Payer: Self-pay | Admitting: Cardiovascular Disease

## 2016-03-31 NOTE — Telephone Encounter (Signed)
Pt calling re fax he is sending to Dr. Burt Knack this week, if he could look over it and call pt

## 2016-04-02 NOTE — Telephone Encounter (Signed)
Records received and placed in Dr Antionette Char folder for review. Most recent echo 03/25/16 from [facility] shows decreased EF 35-40%.

## 2016-04-05 NOTE — Telephone Encounter (Signed)
F/u  Pt following up on records faxed last week to Dr Burt Knack; Please call back and discuss.

## 2016-04-05 NOTE — Telephone Encounter (Signed)
Informed pt that I do see where Lauren said records were received. Advised pt that I do not see any recommendations or where Dr. Burt Knack has reviewed them as of yet. Advised pt I will send this message to Dr. Burt Knack. Pt wants to know if Dr. Burt Knack feels that he needs to come in and be seen soon? Pt verbalized understanding and was appreciative for call.

## 2016-04-14 DIAGNOSIS — I2510 Atherosclerotic heart disease of native coronary artery without angina pectoris: Secondary | ICD-10-CM | POA: Diagnosis not present

## 2016-04-14 DIAGNOSIS — I4891 Unspecified atrial fibrillation: Secondary | ICD-10-CM | POA: Diagnosis not present

## 2016-04-14 DIAGNOSIS — I255 Ischemic cardiomyopathy: Secondary | ICD-10-CM | POA: Diagnosis not present

## 2016-04-14 NOTE — Telephone Encounter (Signed)
Are these records in my folder? If so, I will review Thursday when I'm back in the office

## 2016-04-16 NOTE — Telephone Encounter (Signed)
I spoke with the pt and he wanted to get Dr Antionette Char thoughts on the records that were sent from Dr Garnette Gunner in [facility] (Primary Care). I made him aware that we did note a decrease in the pt's EF and if he has not been seen by Cardiology then he would need to be seen for further evaluation and treatment.  The pt said he saw his Cardiologist in [place] last Wednesday and was advised to increase his diuretic.  The pt is scheduled to establish with a new cardiologist in [facility] next week and undergo further testing with a heart monitor.  The pt will forward copies of his records to our office so that we can remain in the "loop" with his cardiac evaluation.  The pt is interested in Dr [employer] thoughts as his evaluation continues and may schedule an appointment in the future to review everything when he is back in our area visiting.

## 2016-04-19 ENCOUNTER — Telehealth: Payer: Self-pay | Admitting: Cardiovascular Disease

## 2016-04-19 DIAGNOSIS — I429 Cardiomyopathy, unspecified: Secondary | ICD-10-CM | POA: Diagnosis not present

## 2016-04-19 DIAGNOSIS — I10 Essential (primary) hypertension: Secondary | ICD-10-CM | POA: Diagnosis not present

## 2016-04-19 DIAGNOSIS — R609 Edema, unspecified: Secondary | ICD-10-CM | POA: Diagnosis not present

## 2016-04-19 DIAGNOSIS — I2510 Atherosclerotic heart disease of native coronary artery without angina pectoris: Secondary | ICD-10-CM | POA: Diagnosis not present

## 2016-04-19 DIAGNOSIS — I4892 Unspecified atrial flutter: Secondary | ICD-10-CM | POA: Diagnosis not present

## 2016-04-19 NOTE — Telephone Encounter (Signed)
New message      The pt was wanting the nurse to fax information The pt needs a copy of the last heart monitor study done about 4 mths ago   Are you calling in reference to your FMLA or disability form? No  1. What is your question in regards to FMLA or disability form?No   2. Do you need copies of your medical records? yes  3. Are you waiting on a nurse to call you back with results or are you wanting copies of your results? Wanting records, send to DR. Rozell Searing to fax number [phone] the office and another copy  Dr. Garnette Gunner fax number [phone] the pt wants done before 4 pm today

## 2016-04-22 DIAGNOSIS — I4891 Unspecified atrial fibrillation: Secondary | ICD-10-CM | POA: Diagnosis not present

## 2016-04-22 DIAGNOSIS — M79671 Pain in right foot: Secondary | ICD-10-CM | POA: Diagnosis not present

## 2016-04-22 DIAGNOSIS — B351 Tinea unguium: Secondary | ICD-10-CM | POA: Diagnosis not present

## 2016-04-22 DIAGNOSIS — Z5181 Encounter for therapeutic drug level monitoring: Secondary | ICD-10-CM | POA: Diagnosis not present

## 2016-04-22 DIAGNOSIS — E1141 Type 2 diabetes mellitus with diabetic mononeuropathy: Secondary | ICD-10-CM | POA: Diagnosis not present

## 2016-04-22 DIAGNOSIS — Z7901 Long term (current) use of anticoagulants: Secondary | ICD-10-CM | POA: Diagnosis not present

## 2016-04-22 DIAGNOSIS — G5791 Unspecified mononeuropathy of right lower limb: Secondary | ICD-10-CM | POA: Diagnosis not present

## 2016-04-22 DIAGNOSIS — M79675 Pain in left toe(s): Secondary | ICD-10-CM | POA: Diagnosis not present

## 2016-04-22 DIAGNOSIS — L89892 Pressure ulcer of other site, stage 2: Secondary | ICD-10-CM | POA: Diagnosis not present

## 2016-04-22 DIAGNOSIS — M79674 Pain in right toe(s): Secondary | ICD-10-CM | POA: Diagnosis not present

## 2016-04-22 DIAGNOSIS — G5792 Unspecified mononeuropathy of left lower limb: Secondary | ICD-10-CM | POA: Diagnosis not present

## 2016-04-26 DIAGNOSIS — R609 Edema, unspecified: Secondary | ICD-10-CM | POA: Diagnosis not present

## 2016-04-28 ENCOUNTER — Other Ambulatory Visit: Payer: Self-pay | Admitting: Internal Medicine

## 2016-05-10 DIAGNOSIS — G5792 Unspecified mononeuropathy of left lower limb: Secondary | ICD-10-CM | POA: Diagnosis not present

## 2016-05-10 DIAGNOSIS — B351 Tinea unguium: Secondary | ICD-10-CM | POA: Diagnosis not present

## 2016-05-10 DIAGNOSIS — G5791 Unspecified mononeuropathy of right lower limb: Secondary | ICD-10-CM | POA: Diagnosis not present

## 2016-05-10 DIAGNOSIS — E1141 Type 2 diabetes mellitus with diabetic mononeuropathy: Secondary | ICD-10-CM | POA: Diagnosis not present

## 2016-05-10 DIAGNOSIS — M79674 Pain in right toe(s): Secondary | ICD-10-CM | POA: Diagnosis not present

## 2016-05-10 DIAGNOSIS — M79675 Pain in left toe(s): Secondary | ICD-10-CM | POA: Diagnosis not present

## 2016-05-10 DIAGNOSIS — L89892 Pressure ulcer of other site, stage 2: Secondary | ICD-10-CM | POA: Diagnosis not present

## 2016-05-18 DIAGNOSIS — E785 Hyperlipidemia, unspecified: Secondary | ICD-10-CM | POA: Diagnosis not present

## 2016-05-18 DIAGNOSIS — I4891 Unspecified atrial fibrillation: Secondary | ICD-10-CM | POA: Diagnosis not present

## 2016-05-18 DIAGNOSIS — E119 Type 2 diabetes mellitus without complications: Secondary | ICD-10-CM | POA: Diagnosis not present

## 2016-05-18 DIAGNOSIS — R0602 Shortness of breath: Secondary | ICD-10-CM | POA: Diagnosis not present

## 2016-05-20 DIAGNOSIS — Z7901 Long term (current) use of anticoagulants: Secondary | ICD-10-CM | POA: Diagnosis not present

## 2016-05-20 DIAGNOSIS — I4891 Unspecified atrial fibrillation: Secondary | ICD-10-CM | POA: Diagnosis not present

## 2016-05-20 DIAGNOSIS — Z5181 Encounter for therapeutic drug level monitoring: Secondary | ICD-10-CM | POA: Diagnosis not present

## 2016-05-24 DIAGNOSIS — I10 Essential (primary) hypertension: Secondary | ICD-10-CM | POA: Diagnosis not present

## 2016-05-24 DIAGNOSIS — M79671 Pain in right foot: Secondary | ICD-10-CM | POA: Diagnosis not present

## 2016-05-24 DIAGNOSIS — I2510 Atherosclerotic heart disease of native coronary artery without angina pectoris: Secondary | ICD-10-CM | POA: Diagnosis not present

## 2016-05-24 DIAGNOSIS — E1141 Type 2 diabetes mellitus with diabetic mononeuropathy: Secondary | ICD-10-CM | POA: Diagnosis not present

## 2016-05-24 DIAGNOSIS — I4892 Unspecified atrial flutter: Secondary | ICD-10-CM | POA: Diagnosis not present

## 2016-05-24 DIAGNOSIS — R609 Edema, unspecified: Secondary | ICD-10-CM | POA: Diagnosis not present

## 2016-05-24 DIAGNOSIS — L89892 Pressure ulcer of other site, stage 2: Secondary | ICD-10-CM | POA: Diagnosis not present

## 2016-05-24 DIAGNOSIS — I429 Cardiomyopathy, unspecified: Secondary | ICD-10-CM | POA: Diagnosis not present

## 2016-05-24 DIAGNOSIS — G5791 Unspecified mononeuropathy of right lower limb: Secondary | ICD-10-CM | POA: Diagnosis not present

## 2016-05-24 DIAGNOSIS — G5792 Unspecified mononeuropathy of left lower limb: Secondary | ICD-10-CM | POA: Diagnosis not present

## 2016-05-26 DIAGNOSIS — I4891 Unspecified atrial fibrillation: Secondary | ICD-10-CM | POA: Diagnosis not present

## 2016-05-26 DIAGNOSIS — E1165 Type 2 diabetes mellitus with hyperglycemia: Secondary | ICD-10-CM | POA: Diagnosis not present

## 2016-05-26 DIAGNOSIS — E785 Hyperlipidemia, unspecified: Secondary | ICD-10-CM | POA: Diagnosis not present

## 2016-05-26 DIAGNOSIS — I10 Essential (primary) hypertension: Secondary | ICD-10-CM | POA: Diagnosis not present

## 2016-06-07 DIAGNOSIS — G5791 Unspecified mononeuropathy of right lower limb: Secondary | ICD-10-CM | POA: Diagnosis not present

## 2016-06-07 DIAGNOSIS — L89891 Pressure ulcer of other site, stage 1: Secondary | ICD-10-CM | POA: Diagnosis not present

## 2016-06-07 DIAGNOSIS — G5792 Unspecified mononeuropathy of left lower limb: Secondary | ICD-10-CM | POA: Diagnosis not present

## 2016-06-07 DIAGNOSIS — M79671 Pain in right foot: Secondary | ICD-10-CM | POA: Diagnosis not present

## 2016-06-07 DIAGNOSIS — E1141 Type 2 diabetes mellitus with diabetic mononeuropathy: Secondary | ICD-10-CM | POA: Diagnosis not present

## 2016-06-17 DIAGNOSIS — Z5181 Encounter for therapeutic drug level monitoring: Secondary | ICD-10-CM | POA: Diagnosis not present

## 2016-06-17 DIAGNOSIS — I4891 Unspecified atrial fibrillation: Secondary | ICD-10-CM | POA: Diagnosis not present

## 2016-06-17 DIAGNOSIS — Z7901 Long term (current) use of anticoagulants: Secondary | ICD-10-CM | POA: Diagnosis not present

## 2016-07-01 DIAGNOSIS — G5792 Unspecified mononeuropathy of left lower limb: Secondary | ICD-10-CM | POA: Diagnosis not present

## 2016-07-01 DIAGNOSIS — M79674 Pain in right toe(s): Secondary | ICD-10-CM | POA: Diagnosis not present

## 2016-07-01 DIAGNOSIS — M79675 Pain in left toe(s): Secondary | ICD-10-CM | POA: Diagnosis not present

## 2016-07-01 DIAGNOSIS — G5791 Unspecified mononeuropathy of right lower limb: Secondary | ICD-10-CM | POA: Diagnosis not present

## 2016-07-01 DIAGNOSIS — E1141 Type 2 diabetes mellitus with diabetic mononeuropathy: Secondary | ICD-10-CM | POA: Diagnosis not present

## 2016-07-01 DIAGNOSIS — B351 Tinea unguium: Secondary | ICD-10-CM | POA: Diagnosis not present

## 2016-07-05 ENCOUNTER — Other Ambulatory Visit: Payer: Self-pay | Admitting: Endocrinology

## 2016-07-14 ENCOUNTER — Other Ambulatory Visit: Payer: Self-pay | Admitting: Internal Medicine

## 2016-07-14 DIAGNOSIS — N401 Enlarged prostate with lower urinary tract symptoms: Secondary | ICD-10-CM

## 2016-07-14 DIAGNOSIS — R351 Nocturia: Principal | ICD-10-CM

## 2016-07-19 DIAGNOSIS — Z7901 Long term (current) use of anticoagulants: Secondary | ICD-10-CM | POA: Diagnosis not present

## 2016-07-19 DIAGNOSIS — Z5181 Encounter for therapeutic drug level monitoring: Secondary | ICD-10-CM | POA: Diagnosis not present

## 2016-07-19 DIAGNOSIS — I4891 Unspecified atrial fibrillation: Secondary | ICD-10-CM | POA: Diagnosis not present

## 2016-07-19 DIAGNOSIS — Z23 Encounter for immunization: Secondary | ICD-10-CM | POA: Diagnosis not present

## 2016-07-26 DIAGNOSIS — I071 Rheumatic tricuspid insufficiency: Secondary | ICD-10-CM | POA: Diagnosis not present

## 2016-07-26 DIAGNOSIS — I4891 Unspecified atrial fibrillation: Secondary | ICD-10-CM | POA: Diagnosis not present

## 2016-07-26 DIAGNOSIS — I5020 Unspecified systolic (congestive) heart failure: Secondary | ICD-10-CM | POA: Diagnosis not present

## 2016-07-28 DIAGNOSIS — I429 Cardiomyopathy, unspecified: Secondary | ICD-10-CM | POA: Diagnosis not present

## 2016-07-28 DIAGNOSIS — I10 Essential (primary) hypertension: Secondary | ICD-10-CM | POA: Diagnosis not present

## 2016-07-28 DIAGNOSIS — R001 Bradycardia, unspecified: Secondary | ICD-10-CM | POA: Diagnosis not present

## 2016-07-28 DIAGNOSIS — E785 Hyperlipidemia, unspecified: Secondary | ICD-10-CM | POA: Diagnosis not present

## 2016-07-28 DIAGNOSIS — I4892 Unspecified atrial flutter: Secondary | ICD-10-CM | POA: Diagnosis not present

## 2016-07-28 DIAGNOSIS — R609 Edema, unspecified: Secondary | ICD-10-CM | POA: Diagnosis not present

## 2016-07-28 DIAGNOSIS — I2510 Atherosclerotic heart disease of native coronary artery without angina pectoris: Secondary | ICD-10-CM | POA: Diagnosis not present

## 2016-08-03 DIAGNOSIS — R001 Bradycardia, unspecified: Secondary | ICD-10-CM | POA: Diagnosis not present

## 2016-08-10 ENCOUNTER — Other Ambulatory Visit: Payer: Self-pay | Admitting: Endocrinology

## 2016-08-18 DIAGNOSIS — Z5181 Encounter for therapeutic drug level monitoring: Secondary | ICD-10-CM | POA: Diagnosis not present

## 2016-08-18 DIAGNOSIS — Z7901 Long term (current) use of anticoagulants: Secondary | ICD-10-CM | POA: Diagnosis not present

## 2016-08-18 DIAGNOSIS — I4891 Unspecified atrial fibrillation: Secondary | ICD-10-CM | POA: Diagnosis not present

## 2016-08-28 ENCOUNTER — Other Ambulatory Visit: Payer: Self-pay | Admitting: Internal Medicine

## 2016-09-01 ENCOUNTER — Other Ambulatory Visit: Payer: Self-pay | Admitting: Endocrinology

## 2016-09-13 DIAGNOSIS — I10 Essential (primary) hypertension: Secondary | ICD-10-CM | POA: Diagnosis not present

## 2016-09-13 DIAGNOSIS — M79671 Pain in right foot: Secondary | ICD-10-CM | POA: Diagnosis not present

## 2016-09-13 DIAGNOSIS — L84 Corns and callosities: Secondary | ICD-10-CM | POA: Diagnosis not present

## 2016-09-13 DIAGNOSIS — E1141 Type 2 diabetes mellitus with diabetic mononeuropathy: Secondary | ICD-10-CM | POA: Diagnosis not present

## 2016-09-13 DIAGNOSIS — I2510 Atherosclerotic heart disease of native coronary artery without angina pectoris: Secondary | ICD-10-CM | POA: Diagnosis not present

## 2016-09-13 DIAGNOSIS — G5792 Unspecified mononeuropathy of left lower limb: Secondary | ICD-10-CM | POA: Diagnosis not present

## 2016-09-13 DIAGNOSIS — R001 Bradycardia, unspecified: Secondary | ICD-10-CM | POA: Diagnosis not present

## 2016-09-13 DIAGNOSIS — M79674 Pain in right toe(s): Secondary | ICD-10-CM | POA: Diagnosis not present

## 2016-09-13 DIAGNOSIS — R609 Edema, unspecified: Secondary | ICD-10-CM | POA: Diagnosis not present

## 2016-09-13 DIAGNOSIS — I429 Cardiomyopathy, unspecified: Secondary | ICD-10-CM | POA: Diagnosis not present

## 2016-09-13 DIAGNOSIS — I4892 Unspecified atrial flutter: Secondary | ICD-10-CM | POA: Diagnosis not present

## 2016-09-13 DIAGNOSIS — G5791 Unspecified mononeuropathy of right lower limb: Secondary | ICD-10-CM | POA: Diagnosis not present

## 2016-09-13 DIAGNOSIS — B351 Tinea unguium: Secondary | ICD-10-CM | POA: Diagnosis not present

## 2016-09-13 DIAGNOSIS — M79675 Pain in left toe(s): Secondary | ICD-10-CM | POA: Diagnosis not present

## 2016-09-15 DIAGNOSIS — Z7901 Long term (current) use of anticoagulants: Secondary | ICD-10-CM | POA: Diagnosis not present

## 2016-09-15 DIAGNOSIS — I4891 Unspecified atrial fibrillation: Secondary | ICD-10-CM | POA: Diagnosis not present

## 2016-09-15 DIAGNOSIS — Z5181 Encounter for therapeutic drug level monitoring: Secondary | ICD-10-CM | POA: Diagnosis not present

## 2016-09-16 ENCOUNTER — Other Ambulatory Visit: Payer: Self-pay

## 2016-09-29 DIAGNOSIS — Z8546 Personal history of malignant neoplasm of prostate: Secondary | ICD-10-CM | POA: Diagnosis not present

## 2016-09-29 DIAGNOSIS — C679 Malignant neoplasm of bladder, unspecified: Secondary | ICD-10-CM | POA: Diagnosis not present

## 2016-09-29 DIAGNOSIS — N359 Urethral stricture, unspecified: Secondary | ICD-10-CM | POA: Diagnosis not present

## 2016-10-12 DIAGNOSIS — I429 Cardiomyopathy, unspecified: Secondary | ICD-10-CM | POA: Diagnosis not present

## 2016-10-12 DIAGNOSIS — I2510 Atherosclerotic heart disease of native coronary artery without angina pectoris: Secondary | ICD-10-CM | POA: Diagnosis not present

## 2016-10-12 DIAGNOSIS — I495 Sick sinus syndrome: Secondary | ICD-10-CM | POA: Diagnosis not present

## 2016-10-12 DIAGNOSIS — I10 Essential (primary) hypertension: Secondary | ICD-10-CM | POA: Diagnosis not present

## 2016-10-12 DIAGNOSIS — I4892 Unspecified atrial flutter: Secondary | ICD-10-CM | POA: Diagnosis not present

## 2016-10-12 DIAGNOSIS — Z7901 Long term (current) use of anticoagulants: Secondary | ICD-10-CM | POA: Diagnosis not present

## 2016-10-12 DIAGNOSIS — Z950 Presence of cardiac pacemaker: Secondary | ICD-10-CM | POA: Diagnosis not present

## 2016-10-12 DIAGNOSIS — R001 Bradycardia, unspecified: Secondary | ICD-10-CM | POA: Diagnosis not present

## 2016-10-12 DIAGNOSIS — E785 Hyperlipidemia, unspecified: Secondary | ICD-10-CM | POA: Diagnosis not present

## 2016-10-18 ENCOUNTER — Other Ambulatory Visit: Payer: Self-pay | Admitting: Endocrinology

## 2016-10-20 DIAGNOSIS — I4892 Unspecified atrial flutter: Secondary | ICD-10-CM | POA: Diagnosis not present

## 2016-10-21 DIAGNOSIS — I4891 Unspecified atrial fibrillation: Secondary | ICD-10-CM | POA: Diagnosis not present

## 2016-10-21 DIAGNOSIS — Z7901 Long term (current) use of anticoagulants: Secondary | ICD-10-CM | POA: Diagnosis not present

## 2016-10-21 DIAGNOSIS — Z5181 Encounter for therapeutic drug level monitoring: Secondary | ICD-10-CM | POA: Diagnosis not present

## 2016-11-08 DIAGNOSIS — Z8546 Personal history of malignant neoplasm of prostate: Secondary | ICD-10-CM | POA: Diagnosis not present

## 2016-11-08 DIAGNOSIS — C679 Malignant neoplasm of bladder, unspecified: Secondary | ICD-10-CM | POA: Diagnosis not present

## 2016-11-08 DIAGNOSIS — R350 Frequency of micturition: Secondary | ICD-10-CM | POA: Diagnosis not present

## 2016-11-08 DIAGNOSIS — N359 Urethral stricture, unspecified: Secondary | ICD-10-CM | POA: Diagnosis not present

## 2016-11-09 DIAGNOSIS — E1140 Type 2 diabetes mellitus with diabetic neuropathy, unspecified: Secondary | ICD-10-CM | POA: Diagnosis not present

## 2016-11-09 DIAGNOSIS — I4891 Unspecified atrial fibrillation: Secondary | ICD-10-CM | POA: Diagnosis not present

## 2016-11-18 DIAGNOSIS — Z7901 Long term (current) use of anticoagulants: Secondary | ICD-10-CM | POA: Diagnosis not present

## 2016-11-18 DIAGNOSIS — I4891 Unspecified atrial fibrillation: Secondary | ICD-10-CM | POA: Diagnosis not present

## 2016-11-18 DIAGNOSIS — Z5181 Encounter for therapeutic drug level monitoring: Secondary | ICD-10-CM | POA: Diagnosis not present

## 2016-11-22 DIAGNOSIS — M79671 Pain in right foot: Secondary | ICD-10-CM | POA: Diagnosis not present

## 2016-11-22 DIAGNOSIS — G5792 Unspecified mononeuropathy of left lower limb: Secondary | ICD-10-CM | POA: Diagnosis not present

## 2016-11-22 DIAGNOSIS — M79674 Pain in right toe(s): Secondary | ICD-10-CM | POA: Diagnosis not present

## 2016-11-22 DIAGNOSIS — L84 Corns and callosities: Secondary | ICD-10-CM | POA: Diagnosis not present

## 2016-11-22 DIAGNOSIS — M79675 Pain in left toe(s): Secondary | ICD-10-CM | POA: Diagnosis not present

## 2016-11-22 DIAGNOSIS — E1141 Type 2 diabetes mellitus with diabetic mononeuropathy: Secondary | ICD-10-CM | POA: Diagnosis not present

## 2016-11-22 DIAGNOSIS — G5791 Unspecified mononeuropathy of right lower limb: Secondary | ICD-10-CM | POA: Diagnosis not present

## 2016-11-22 DIAGNOSIS — B351 Tinea unguium: Secondary | ICD-10-CM | POA: Diagnosis not present

## 2016-12-02 DIAGNOSIS — Z01818 Encounter for other preprocedural examination: Secondary | ICD-10-CM | POA: Diagnosis not present

## 2016-12-03 DIAGNOSIS — R21 Rash and other nonspecific skin eruption: Secondary | ICD-10-CM | POA: Diagnosis not present

## 2016-12-06 DIAGNOSIS — D303 Benign neoplasm of bladder: Secondary | ICD-10-CM | POA: Diagnosis not present

## 2016-12-06 DIAGNOSIS — Z8551 Personal history of malignant neoplasm of bladder: Secondary | ICD-10-CM | POA: Diagnosis not present

## 2016-12-06 DIAGNOSIS — Z8546 Personal history of malignant neoplasm of prostate: Secondary | ICD-10-CM | POA: Diagnosis not present

## 2016-12-06 DIAGNOSIS — N329 Bladder disorder, unspecified: Secondary | ICD-10-CM | POA: Diagnosis not present

## 2016-12-06 DIAGNOSIS — Z923 Personal history of irradiation: Secondary | ICD-10-CM | POA: Diagnosis not present

## 2016-12-06 DIAGNOSIS — N359 Urethral stricture, unspecified: Secondary | ICD-10-CM | POA: Diagnosis not present

## 2016-12-08 DIAGNOSIS — N359 Urethral stricture, unspecified: Secondary | ICD-10-CM | POA: Diagnosis not present

## 2016-12-08 DIAGNOSIS — Z8546 Personal history of malignant neoplasm of prostate: Secondary | ICD-10-CM | POA: Diagnosis not present

## 2016-12-08 DIAGNOSIS — C679 Malignant neoplasm of bladder, unspecified: Secondary | ICD-10-CM | POA: Diagnosis not present

## 2016-12-16 DIAGNOSIS — I4891 Unspecified atrial fibrillation: Secondary | ICD-10-CM | POA: Diagnosis not present

## 2016-12-16 DIAGNOSIS — Z5181 Encounter for therapeutic drug level monitoring: Secondary | ICD-10-CM | POA: Diagnosis not present

## 2016-12-16 DIAGNOSIS — Z7901 Long term (current) use of anticoagulants: Secondary | ICD-10-CM | POA: Diagnosis not present

## 2016-12-28 DIAGNOSIS — R319 Hematuria, unspecified: Secondary | ICD-10-CM | POA: Diagnosis not present

## 2016-12-28 DIAGNOSIS — E785 Hyperlipidemia, unspecified: Secondary | ICD-10-CM | POA: Diagnosis not present

## 2016-12-28 DIAGNOSIS — I10 Essential (primary) hypertension: Secondary | ICD-10-CM | POA: Diagnosis not present

## 2016-12-28 DIAGNOSIS — Z125 Encounter for screening for malignant neoplasm of prostate: Secondary | ICD-10-CM | POA: Diagnosis not present

## 2016-12-28 DIAGNOSIS — Z0000 Encounter for general adult medical examination without abnormal findings: Secondary | ICD-10-CM | POA: Diagnosis not present

## 2016-12-28 DIAGNOSIS — E119 Type 2 diabetes mellitus without complications: Secondary | ICD-10-CM | POA: Diagnosis not present

## 2016-12-29 DIAGNOSIS — Z8546 Personal history of malignant neoplasm of prostate: Secondary | ICD-10-CM | POA: Diagnosis not present

## 2016-12-29 DIAGNOSIS — N359 Urethral stricture, unspecified: Secondary | ICD-10-CM | POA: Diagnosis not present

## 2016-12-29 DIAGNOSIS — C679 Malignant neoplasm of bladder, unspecified: Secondary | ICD-10-CM | POA: Diagnosis not present

## 2016-12-29 DIAGNOSIS — R350 Frequency of micturition: Secondary | ICD-10-CM | POA: Diagnosis not present

## 2017-01-04 DIAGNOSIS — I2510 Atherosclerotic heart disease of native coronary artery without angina pectoris: Secondary | ICD-10-CM | POA: Diagnosis not present

## 2017-01-04 DIAGNOSIS — E119 Type 2 diabetes mellitus without complications: Secondary | ICD-10-CM | POA: Diagnosis not present

## 2017-01-04 DIAGNOSIS — R319 Hematuria, unspecified: Secondary | ICD-10-CM | POA: Diagnosis not present

## 2017-01-06 DIAGNOSIS — I4891 Unspecified atrial fibrillation: Secondary | ICD-10-CM | POA: Diagnosis not present

## 2017-01-06 DIAGNOSIS — Z7901 Long term (current) use of anticoagulants: Secondary | ICD-10-CM | POA: Diagnosis not present

## 2017-01-06 DIAGNOSIS — Z5181 Encounter for therapeutic drug level monitoring: Secondary | ICD-10-CM | POA: Diagnosis not present

## 2017-01-18 DIAGNOSIS — I10 Essential (primary) hypertension: Secondary | ICD-10-CM | POA: Diagnosis not present

## 2017-01-18 DIAGNOSIS — R001 Bradycardia, unspecified: Secondary | ICD-10-CM | POA: Diagnosis not present

## 2017-01-18 DIAGNOSIS — I420 Dilated cardiomyopathy: Secondary | ICD-10-CM | POA: Diagnosis not present

## 2017-01-18 DIAGNOSIS — R609 Edema, unspecified: Secondary | ICD-10-CM | POA: Diagnosis not present

## 2017-01-18 DIAGNOSIS — Z950 Presence of cardiac pacemaker: Secondary | ICD-10-CM | POA: Diagnosis not present

## 2017-01-18 DIAGNOSIS — I2510 Atherosclerotic heart disease of native coronary artery without angina pectoris: Secondary | ICD-10-CM | POA: Diagnosis not present

## 2017-01-24 DIAGNOSIS — M79674 Pain in right toe(s): Secondary | ICD-10-CM | POA: Diagnosis not present

## 2017-01-24 DIAGNOSIS — G603 Idiopathic progressive neuropathy: Secondary | ICD-10-CM | POA: Diagnosis not present

## 2017-01-24 DIAGNOSIS — M79671 Pain in right foot: Secondary | ICD-10-CM | POA: Diagnosis not present

## 2017-01-24 DIAGNOSIS — B351 Tinea unguium: Secondary | ICD-10-CM | POA: Diagnosis not present

## 2017-01-24 DIAGNOSIS — E1141 Type 2 diabetes mellitus with diabetic mononeuropathy: Secondary | ICD-10-CM | POA: Diagnosis not present

## 2017-01-24 DIAGNOSIS — L84 Corns and callosities: Secondary | ICD-10-CM | POA: Diagnosis not present

## 2017-01-24 DIAGNOSIS — M79675 Pain in left toe(s): Secondary | ICD-10-CM | POA: Diagnosis not present

## 2017-01-24 DIAGNOSIS — I739 Peripheral vascular disease, unspecified: Secondary | ICD-10-CM | POA: Diagnosis not present

## 2017-02-02 DIAGNOSIS — I495 Sick sinus syndrome: Secondary | ICD-10-CM | POA: Diagnosis not present

## 2017-02-02 DIAGNOSIS — Z45018 Encounter for adjustment and management of other part of cardiac pacemaker: Secondary | ICD-10-CM | POA: Diagnosis not present

## 2017-02-02 DIAGNOSIS — I481 Persistent atrial fibrillation: Secondary | ICD-10-CM | POA: Diagnosis not present

## 2017-02-03 DIAGNOSIS — Z5181 Encounter for therapeutic drug level monitoring: Secondary | ICD-10-CM | POA: Diagnosis not present

## 2017-02-03 DIAGNOSIS — I4891 Unspecified atrial fibrillation: Secondary | ICD-10-CM | POA: Diagnosis not present

## 2017-02-03 DIAGNOSIS — Z7901 Long term (current) use of anticoagulants: Secondary | ICD-10-CM | POA: Diagnosis not present

## 2017-02-14 DIAGNOSIS — R110 Nausea: Secondary | ICD-10-CM | POA: Diagnosis not present

## 2017-02-14 DIAGNOSIS — I4891 Unspecified atrial fibrillation: Secondary | ICD-10-CM | POA: Diagnosis not present

## 2017-02-14 DIAGNOSIS — R0602 Shortness of breath: Secondary | ICD-10-CM | POA: Diagnosis not present

## 2017-02-14 DIAGNOSIS — B356 Tinea cruris: Secondary | ICD-10-CM | POA: Diagnosis not present

## 2017-02-22 DIAGNOSIS — R110 Nausea: Secondary | ICD-10-CM | POA: Diagnosis not present

## 2017-02-22 DIAGNOSIS — R319 Hematuria, unspecified: Secondary | ICD-10-CM | POA: Diagnosis not present

## 2017-02-22 DIAGNOSIS — R109 Unspecified abdominal pain: Secondary | ICD-10-CM | POA: Diagnosis not present

## 2017-02-22 DIAGNOSIS — I10 Essential (primary) hypertension: Secondary | ICD-10-CM | POA: Diagnosis not present

## 2017-02-22 DIAGNOSIS — E1165 Type 2 diabetes mellitus with hyperglycemia: Secondary | ICD-10-CM | POA: Diagnosis not present

## 2017-03-02 DIAGNOSIS — R350 Frequency of micturition: Secondary | ICD-10-CM | POA: Diagnosis not present

## 2017-03-02 DIAGNOSIS — C679 Malignant neoplasm of bladder, unspecified: Secondary | ICD-10-CM | POA: Diagnosis not present

## 2017-03-02 DIAGNOSIS — Z8546 Personal history of malignant neoplasm of prostate: Secondary | ICD-10-CM | POA: Diagnosis not present

## 2017-03-02 DIAGNOSIS — N359 Urethral stricture, unspecified: Secondary | ICD-10-CM | POA: Diagnosis not present

## 2017-03-03 DIAGNOSIS — Z7901 Long term (current) use of anticoagulants: Secondary | ICD-10-CM | POA: Diagnosis not present

## 2017-03-03 DIAGNOSIS — Z5181 Encounter for therapeutic drug level monitoring: Secondary | ICD-10-CM | POA: Diagnosis not present

## 2017-03-03 DIAGNOSIS — I4891 Unspecified atrial fibrillation: Secondary | ICD-10-CM | POA: Diagnosis not present

## 2017-03-09 DIAGNOSIS — H43813 Vitreous degeneration, bilateral: Secondary | ICD-10-CM | POA: Diagnosis not present

## 2017-03-09 DIAGNOSIS — E119 Type 2 diabetes mellitus without complications: Secondary | ICD-10-CM | POA: Diagnosis not present

## 2017-03-09 DIAGNOSIS — H35073 Retinal telangiectasis, bilateral: Secondary | ICD-10-CM | POA: Diagnosis not present

## 2017-03-09 DIAGNOSIS — H26493 Other secondary cataract, bilateral: Secondary | ICD-10-CM | POA: Diagnosis not present

## 2017-03-28 DIAGNOSIS — M79674 Pain in right toe(s): Secondary | ICD-10-CM | POA: Diagnosis not present

## 2017-03-28 DIAGNOSIS — L84 Corns and callosities: Secondary | ICD-10-CM | POA: Diagnosis not present

## 2017-03-28 DIAGNOSIS — G603 Idiopathic progressive neuropathy: Secondary | ICD-10-CM | POA: Diagnosis not present

## 2017-03-28 DIAGNOSIS — E1141 Type 2 diabetes mellitus with diabetic mononeuropathy: Secondary | ICD-10-CM | POA: Diagnosis not present

## 2017-03-28 DIAGNOSIS — B351 Tinea unguium: Secondary | ICD-10-CM | POA: Diagnosis not present

## 2017-03-28 DIAGNOSIS — M79675 Pain in left toe(s): Secondary | ICD-10-CM | POA: Diagnosis not present

## 2017-03-28 DIAGNOSIS — M79671 Pain in right foot: Secondary | ICD-10-CM | POA: Diagnosis not present

## 2017-03-31 DIAGNOSIS — Z7901 Long term (current) use of anticoagulants: Secondary | ICD-10-CM | POA: Diagnosis not present

## 2017-03-31 DIAGNOSIS — R609 Edema, unspecified: Secondary | ICD-10-CM | POA: Diagnosis not present

## 2017-03-31 DIAGNOSIS — I4891 Unspecified atrial fibrillation: Secondary | ICD-10-CM | POA: Diagnosis not present

## 2017-03-31 DIAGNOSIS — Z5181 Encounter for therapeutic drug level monitoring: Secondary | ICD-10-CM | POA: Diagnosis not present

## 2017-04-05 ENCOUNTER — Other Ambulatory Visit: Payer: Self-pay | Admitting: Endocrinology

## 2017-04-05 NOTE — Telephone Encounter (Signed)
No, patient has moved away from [place].

## 2017-04-05 NOTE — Telephone Encounter (Signed)
Okay to refill last seen 12/23/15. No other appointments made. Thank you!

## 2017-04-07 ENCOUNTER — Other Ambulatory Visit: Payer: Self-pay | Admitting: Endocrinology

## 2017-04-07 DIAGNOSIS — I872 Venous insufficiency (chronic) (peripheral): Secondary | ICD-10-CM | POA: Diagnosis not present

## 2017-04-07 DIAGNOSIS — L302 Cutaneous autosensitization: Secondary | ICD-10-CM | POA: Diagnosis not present

## 2017-04-07 DIAGNOSIS — Q825 Congenital non-neoplastic nevus: Secondary | ICD-10-CM | POA: Diagnosis not present

## 2017-04-07 DIAGNOSIS — D2339 Other benign neoplasm of skin of other parts of face: Secondary | ICD-10-CM | POA: Diagnosis not present

## 2017-04-07 DIAGNOSIS — D235 Other benign neoplasm of skin of trunk: Secondary | ICD-10-CM | POA: Diagnosis not present

## 2017-04-07 DIAGNOSIS — L818 Other specified disorders of pigmentation: Secondary | ICD-10-CM | POA: Diagnosis not present

## 2017-04-07 DIAGNOSIS — L821 Other seborrheic keratosis: Secondary | ICD-10-CM | POA: Diagnosis not present

## 2017-04-11 DIAGNOSIS — I2510 Atherosclerotic heart disease of native coronary artery without angina pectoris: Secondary | ICD-10-CM | POA: Diagnosis not present

## 2017-04-11 DIAGNOSIS — I483 Typical atrial flutter: Secondary | ICD-10-CM | POA: Diagnosis not present

## 2017-04-26 DIAGNOSIS — I2510 Atherosclerotic heart disease of native coronary artery without angina pectoris: Secondary | ICD-10-CM | POA: Diagnosis not present

## 2017-04-26 DIAGNOSIS — I4892 Unspecified atrial flutter: Secondary | ICD-10-CM | POA: Diagnosis not present

## 2017-04-26 DIAGNOSIS — E119 Type 2 diabetes mellitus without complications: Secondary | ICD-10-CM | POA: Diagnosis not present

## 2017-04-26 DIAGNOSIS — R609 Edema, unspecified: Secondary | ICD-10-CM | POA: Diagnosis not present

## 2017-04-26 DIAGNOSIS — I420 Dilated cardiomyopathy: Secondary | ICD-10-CM | POA: Diagnosis not present

## 2017-04-26 DIAGNOSIS — I10 Essential (primary) hypertension: Secondary | ICD-10-CM | POA: Diagnosis not present

## 2017-04-28 DIAGNOSIS — Z5181 Encounter for therapeutic drug level monitoring: Secondary | ICD-10-CM | POA: Diagnosis not present

## 2017-04-28 DIAGNOSIS — Z7901 Long term (current) use of anticoagulants: Secondary | ICD-10-CM | POA: Diagnosis not present

## 2017-04-28 DIAGNOSIS — I4891 Unspecified atrial fibrillation: Secondary | ICD-10-CM | POA: Diagnosis not present

## 2017-04-29 DIAGNOSIS — E119 Type 2 diabetes mellitus without complications: Secondary | ICD-10-CM | POA: Diagnosis not present

## 2017-04-29 DIAGNOSIS — I2510 Atherosclerotic heart disease of native coronary artery without angina pectoris: Secondary | ICD-10-CM | POA: Diagnosis not present

## 2017-04-29 DIAGNOSIS — I10 Essential (primary) hypertension: Secondary | ICD-10-CM | POA: Diagnosis not present

## 2017-05-04 DIAGNOSIS — Z950 Presence of cardiac pacemaker: Secondary | ICD-10-CM | POA: Diagnosis not present

## 2017-05-04 DIAGNOSIS — I482 Chronic atrial fibrillation: Secondary | ICD-10-CM | POA: Diagnosis not present

## 2017-05-04 DIAGNOSIS — I495 Sick sinus syndrome: Secondary | ICD-10-CM | POA: Diagnosis not present

## 2017-05-26 DIAGNOSIS — Z5181 Encounter for therapeutic drug level monitoring: Secondary | ICD-10-CM | POA: Diagnosis not present

## 2017-05-26 DIAGNOSIS — I4891 Unspecified atrial fibrillation: Secondary | ICD-10-CM | POA: Diagnosis not present

## 2017-05-26 DIAGNOSIS — Z7901 Long term (current) use of anticoagulants: Secondary | ICD-10-CM | POA: Diagnosis not present

## 2017-05-30 DIAGNOSIS — G603 Idiopathic progressive neuropathy: Secondary | ICD-10-CM | POA: Diagnosis not present

## 2017-05-30 DIAGNOSIS — M79674 Pain in right toe(s): Secondary | ICD-10-CM | POA: Diagnosis not present

## 2017-05-30 DIAGNOSIS — M79675 Pain in left toe(s): Secondary | ICD-10-CM | POA: Diagnosis not present

## 2017-05-30 DIAGNOSIS — E1141 Type 2 diabetes mellitus with diabetic mononeuropathy: Secondary | ICD-10-CM | POA: Diagnosis not present

## 2017-05-30 DIAGNOSIS — L309 Dermatitis, unspecified: Secondary | ICD-10-CM | POA: Diagnosis not present

## 2017-05-30 DIAGNOSIS — M79671 Pain in right foot: Secondary | ICD-10-CM | POA: Diagnosis not present

## 2017-05-30 DIAGNOSIS — B351 Tinea unguium: Secondary | ICD-10-CM | POA: Diagnosis not present

## 2017-05-30 DIAGNOSIS — I739 Peripheral vascular disease, unspecified: Secondary | ICD-10-CM | POA: Diagnosis not present

## 2017-05-30 DIAGNOSIS — L84 Corns and callosities: Secondary | ICD-10-CM | POA: Diagnosis not present

## 2017-06-01 ENCOUNTER — Other Ambulatory Visit: Payer: Self-pay | Admitting: Endocrinology

## 2017-06-23 DIAGNOSIS — I4891 Unspecified atrial fibrillation: Secondary | ICD-10-CM | POA: Diagnosis not present

## 2017-06-23 DIAGNOSIS — Z5181 Encounter for therapeutic drug level monitoring: Secondary | ICD-10-CM | POA: Diagnosis not present

## 2017-06-23 DIAGNOSIS — Z7901 Long term (current) use of anticoagulants: Secondary | ICD-10-CM | POA: Diagnosis not present

## 2017-07-06 DIAGNOSIS — N3040 Irradiation cystitis without hematuria: Secondary | ICD-10-CM | POA: Diagnosis not present

## 2017-07-06 DIAGNOSIS — N359 Urethral stricture, unspecified: Secondary | ICD-10-CM | POA: Diagnosis not present

## 2017-07-06 DIAGNOSIS — Z8546 Personal history of malignant neoplasm of prostate: Secondary | ICD-10-CM | POA: Diagnosis not present

## 2017-07-06 DIAGNOSIS — C679 Malignant neoplasm of bladder, unspecified: Secondary | ICD-10-CM | POA: Diagnosis not present

## 2017-07-15 ENCOUNTER — Other Ambulatory Visit: Payer: Self-pay | Admitting: Endocrinology

## 2017-08-01 DIAGNOSIS — Z6828 Body mass index (BMI) 28.0-28.9, adult: Secondary | ICD-10-CM | POA: Diagnosis not present

## 2017-08-01 DIAGNOSIS — G603 Idiopathic progressive neuropathy: Secondary | ICD-10-CM | POA: Diagnosis not present

## 2017-08-01 DIAGNOSIS — I4891 Unspecified atrial fibrillation: Secondary | ICD-10-CM | POA: Diagnosis not present

## 2017-08-01 DIAGNOSIS — B351 Tinea unguium: Secondary | ICD-10-CM | POA: Diagnosis not present

## 2017-08-01 DIAGNOSIS — L84 Corns and callosities: Secondary | ICD-10-CM | POA: Diagnosis not present

## 2017-08-01 DIAGNOSIS — E1141 Type 2 diabetes mellitus with diabetic mononeuropathy: Secondary | ICD-10-CM | POA: Diagnosis not present

## 2017-08-01 DIAGNOSIS — M79671 Pain in right foot: Secondary | ICD-10-CM | POA: Diagnosis not present

## 2017-08-01 DIAGNOSIS — Z7901 Long term (current) use of anticoagulants: Secondary | ICD-10-CM | POA: Diagnosis not present

## 2017-08-01 DIAGNOSIS — M79674 Pain in right toe(s): Secondary | ICD-10-CM | POA: Diagnosis not present

## 2017-08-01 DIAGNOSIS — I739 Peripheral vascular disease, unspecified: Secondary | ICD-10-CM | POA: Diagnosis not present

## 2017-08-01 DIAGNOSIS — M79675 Pain in left toe(s): Secondary | ICD-10-CM | POA: Diagnosis not present

## 2017-08-01 DIAGNOSIS — Z5181 Encounter for therapeutic drug level monitoring: Secondary | ICD-10-CM | POA: Diagnosis not present

## 2017-08-03 DIAGNOSIS — Z45018 Encounter for adjustment and management of other part of cardiac pacemaker: Secondary | ICD-10-CM | POA: Diagnosis not present

## 2017-08-03 DIAGNOSIS — I495 Sick sinus syndrome: Secondary | ICD-10-CM | POA: Diagnosis not present

## 2017-08-03 DIAGNOSIS — I481 Persistent atrial fibrillation: Secondary | ICD-10-CM | POA: Diagnosis not present

## 2017-08-12 ENCOUNTER — Other Ambulatory Visit: Payer: Self-pay | Admitting: Endocrinology

## 2017-08-17 DIAGNOSIS — E119 Type 2 diabetes mellitus without complications: Secondary | ICD-10-CM | POA: Diagnosis not present

## 2017-08-17 DIAGNOSIS — I10 Essential (primary) hypertension: Secondary | ICD-10-CM | POA: Diagnosis not present

## 2017-08-23 DIAGNOSIS — Z23 Encounter for immunization: Secondary | ICD-10-CM | POA: Diagnosis not present

## 2017-08-23 DIAGNOSIS — I10 Essential (primary) hypertension: Secondary | ICD-10-CM | POA: Diagnosis not present

## 2017-08-23 DIAGNOSIS — E1165 Type 2 diabetes mellitus with hyperglycemia: Secondary | ICD-10-CM | POA: Diagnosis not present

## 2017-08-23 DIAGNOSIS — I4891 Unspecified atrial fibrillation: Secondary | ICD-10-CM | POA: Diagnosis not present

## 2017-08-29 DIAGNOSIS — I4891 Unspecified atrial fibrillation: Secondary | ICD-10-CM | POA: Diagnosis not present

## 2017-08-29 DIAGNOSIS — Z5181 Encounter for therapeutic drug level monitoring: Secondary | ICD-10-CM | POA: Diagnosis not present

## 2017-08-29 DIAGNOSIS — Z7901 Long term (current) use of anticoagulants: Secondary | ICD-10-CM | POA: Diagnosis not present

## 2017-09-26 DIAGNOSIS — I4891 Unspecified atrial fibrillation: Secondary | ICD-10-CM | POA: Diagnosis not present

## 2017-09-26 DIAGNOSIS — Z5181 Encounter for therapeutic drug level monitoring: Secondary | ICD-10-CM | POA: Diagnosis not present

## 2017-09-26 DIAGNOSIS — Z7901 Long term (current) use of anticoagulants: Secondary | ICD-10-CM | POA: Diagnosis not present

## 2017-12-21 ENCOUNTER — Other Ambulatory Visit: Payer: Self-pay | Admitting: Endocrinology

## 2018-06-08 ENCOUNTER — Other Ambulatory Visit: Payer: Self-pay | Admitting: Endocrinology

## 2018-06-17 ENCOUNTER — Other Ambulatory Visit: Payer: Self-pay | Admitting: Endocrinology

## 2019-01-17 NOTE — Telephone Encounter (Signed)
error
